# Patient Record
Sex: Male | Born: 1935 | Race: White | Hispanic: No | Marital: Married | Smoking: Never smoker
Health system: Southern US, Community
[De-identification: ages and names within clinical notes are randomized; demographics above are authoritative.]

## PROBLEM LIST (undated history)

## (undated) DIAGNOSIS — C679 Malignant neoplasm of bladder, unspecified: Secondary | ICD-10-CM

## (undated) DIAGNOSIS — I10 Essential (primary) hypertension: Secondary | ICD-10-CM

## (undated) DIAGNOSIS — E785 Hyperlipidemia, unspecified: Secondary | ICD-10-CM

## (undated) DIAGNOSIS — C61 Malignant neoplasm of prostate: Secondary | ICD-10-CM

## (undated) DIAGNOSIS — N400 Enlarged prostate without lower urinary tract symptoms: Secondary | ICD-10-CM

## (undated) DIAGNOSIS — I4891 Unspecified atrial fibrillation: Secondary | ICD-10-CM

## (undated) DIAGNOSIS — I429 Cardiomyopathy, unspecified: Secondary | ICD-10-CM

## (undated) DIAGNOSIS — K449 Diaphragmatic hernia without obstruction or gangrene: Secondary | ICD-10-CM

## (undated) DIAGNOSIS — F32A Depression, unspecified: Secondary | ICD-10-CM

## (undated) DIAGNOSIS — F329 Major depressive disorder, single episode, unspecified: Secondary | ICD-10-CM

## (undated) DIAGNOSIS — I219 Acute myocardial infarction, unspecified: Secondary | ICD-10-CM

## (undated) DIAGNOSIS — E871 Hypo-osmolality and hyponatremia: Secondary | ICD-10-CM

## (undated) DIAGNOSIS — Z8614 Personal history of Methicillin resistant Staphylococcus aureus infection: Secondary | ICD-10-CM

## (undated) DIAGNOSIS — I2510 Atherosclerotic heart disease of native coronary artery without angina pectoris: Secondary | ICD-10-CM

## (undated) HISTORY — PX: OTHER SURGICAL HISTORY: SHX169

## (undated) HISTORY — DX: Essential (primary) hypertension: I10

## (undated) HISTORY — DX: Malignant neoplasm of bladder, unspecified: C67.9

## (undated) HISTORY — DX: Major depressive disorder, single episode, unspecified: F32.9

## (undated) HISTORY — PX: CORONARY ARTERY BYPASS GRAFT: SHX141

## (undated) HISTORY — DX: Personal history of Methicillin resistant Staphylococcus aureus infection: Z86.14

## (undated) HISTORY — DX: Hyperlipidemia, unspecified: E78.5

## (undated) HISTORY — DX: Malignant neoplasm of prostate: C61

## (undated) HISTORY — PX: TONSILLECTOMY AND ADENOIDECTOMY: SUR1326

## (undated) HISTORY — DX: Depression, unspecified: F32.A

## (undated) HISTORY — PX: FINGER SURGERY: SHX640

---

## 2004-12-03 ENCOUNTER — Ambulatory Visit: Payer: Self-pay | Admitting: Cardiology

## 2005-01-18 ENCOUNTER — Encounter: Payer: Self-pay | Admitting: Cardiology

## 2005-01-30 ENCOUNTER — Encounter: Payer: Self-pay | Admitting: Cardiology

## 2005-03-02 ENCOUNTER — Encounter: Payer: Self-pay | Admitting: Cardiology

## 2005-04-01 ENCOUNTER — Encounter: Payer: Self-pay | Admitting: Cardiology

## 2005-05-02 ENCOUNTER — Encounter: Payer: Self-pay | Admitting: Cardiology

## 2005-06-02 ENCOUNTER — Encounter: Payer: Self-pay | Admitting: Cardiology

## 2005-06-20 ENCOUNTER — Ambulatory Visit: Payer: Self-pay | Admitting: Radiation Oncology

## 2005-06-30 ENCOUNTER — Ambulatory Visit: Payer: Self-pay | Admitting: Radiation Oncology

## 2005-07-31 ENCOUNTER — Ambulatory Visit: Payer: Self-pay | Admitting: Radiation Oncology

## 2005-08-30 ENCOUNTER — Ambulatory Visit: Payer: Self-pay | Admitting: Radiation Oncology

## 2005-09-30 ENCOUNTER — Ambulatory Visit: Payer: Self-pay | Admitting: Radiation Oncology

## 2006-02-06 ENCOUNTER — Ambulatory Visit: Payer: Self-pay | Admitting: Radiation Oncology

## 2006-03-02 ENCOUNTER — Ambulatory Visit: Payer: Self-pay | Admitting: Radiation Oncology

## 2006-08-14 ENCOUNTER — Ambulatory Visit: Payer: Self-pay | Admitting: Radiation Oncology

## 2006-08-31 ENCOUNTER — Ambulatory Visit: Payer: Self-pay | Admitting: Radiation Oncology

## 2006-09-22 DIAGNOSIS — K449 Diaphragmatic hernia without obstruction or gangrene: Secondary | ICD-10-CM | POA: Insufficient documentation

## 2007-02-14 ENCOUNTER — Ambulatory Visit: Payer: Self-pay | Admitting: Radiation Oncology

## 2007-03-03 ENCOUNTER — Ambulatory Visit: Payer: Self-pay | Admitting: Radiation Oncology

## 2007-05-11 ENCOUNTER — Ambulatory Visit: Payer: Self-pay | Admitting: Unknown Physician Specialty

## 2007-08-01 ENCOUNTER — Ambulatory Visit: Payer: Self-pay | Admitting: Radiation Oncology

## 2007-08-15 ENCOUNTER — Ambulatory Visit: Payer: Self-pay | Admitting: Radiation Oncology

## 2007-08-20 ENCOUNTER — Ambulatory Visit: Payer: Self-pay | Admitting: Family Medicine

## 2007-08-31 ENCOUNTER — Ambulatory Visit: Payer: Self-pay | Admitting: Radiation Oncology

## 2008-10-30 ENCOUNTER — Ambulatory Visit: Payer: Self-pay | Admitting: Radiation Oncology

## 2008-11-17 ENCOUNTER — Ambulatory Visit: Payer: Self-pay | Admitting: Radiation Oncology

## 2008-11-30 ENCOUNTER — Ambulatory Visit: Payer: Self-pay | Admitting: Radiation Oncology

## 2009-10-16 ENCOUNTER — Ambulatory Visit: Payer: Self-pay | Admitting: Family Medicine

## 2011-05-02 ENCOUNTER — Ambulatory Visit: Payer: Self-pay | Admitting: Family Medicine

## 2011-05-06 DIAGNOSIS — J189 Pneumonia, unspecified organism: Secondary | ICD-10-CM | POA: Diagnosis not present

## 2011-05-06 DIAGNOSIS — R5381 Other malaise: Secondary | ICD-10-CM | POA: Diagnosis not present

## 2011-05-06 DIAGNOSIS — J209 Acute bronchitis, unspecified: Secondary | ICD-10-CM | POA: Diagnosis not present

## 2011-05-06 DIAGNOSIS — R5383 Other fatigue: Secondary | ICD-10-CM | POA: Diagnosis not present

## 2011-05-07 DIAGNOSIS — J209 Acute bronchitis, unspecified: Secondary | ICD-10-CM | POA: Diagnosis not present

## 2011-05-07 DIAGNOSIS — J3489 Other specified disorders of nose and nasal sinuses: Secondary | ICD-10-CM | POA: Diagnosis not present

## 2011-05-07 DIAGNOSIS — J189 Pneumonia, unspecified organism: Secondary | ICD-10-CM | POA: Diagnosis not present

## 2011-05-11 DIAGNOSIS — J209 Acute bronchitis, unspecified: Secondary | ICD-10-CM | POA: Diagnosis not present

## 2011-05-11 DIAGNOSIS — J189 Pneumonia, unspecified organism: Secondary | ICD-10-CM | POA: Diagnosis not present

## 2011-05-11 DIAGNOSIS — J3489 Other specified disorders of nose and nasal sinuses: Secondary | ICD-10-CM | POA: Diagnosis not present

## 2011-05-17 DIAGNOSIS — J309 Allergic rhinitis, unspecified: Secondary | ICD-10-CM | POA: Diagnosis not present

## 2011-05-20 DIAGNOSIS — J209 Acute bronchitis, unspecified: Secondary | ICD-10-CM | POA: Diagnosis not present

## 2011-05-20 DIAGNOSIS — Z23 Encounter for immunization: Secondary | ICD-10-CM | POA: Diagnosis not present

## 2011-05-24 DIAGNOSIS — J309 Allergic rhinitis, unspecified: Secondary | ICD-10-CM | POA: Diagnosis not present

## 2011-05-31 DIAGNOSIS — J309 Allergic rhinitis, unspecified: Secondary | ICD-10-CM | POA: Diagnosis not present

## 2011-06-03 DIAGNOSIS — J309 Allergic rhinitis, unspecified: Secondary | ICD-10-CM | POA: Diagnosis not present

## 2011-06-07 DIAGNOSIS — J309 Allergic rhinitis, unspecified: Secondary | ICD-10-CM | POA: Diagnosis not present

## 2011-06-21 DIAGNOSIS — J309 Allergic rhinitis, unspecified: Secondary | ICD-10-CM | POA: Diagnosis not present

## 2011-06-28 DIAGNOSIS — J309 Allergic rhinitis, unspecified: Secondary | ICD-10-CM | POA: Diagnosis not present

## 2011-07-12 DIAGNOSIS — J309 Allergic rhinitis, unspecified: Secondary | ICD-10-CM | POA: Diagnosis not present

## 2011-07-19 DIAGNOSIS — J309 Allergic rhinitis, unspecified: Secondary | ICD-10-CM | POA: Diagnosis not present

## 2011-07-26 DIAGNOSIS — J309 Allergic rhinitis, unspecified: Secondary | ICD-10-CM | POA: Diagnosis not present

## 2011-08-02 DIAGNOSIS — J309 Allergic rhinitis, unspecified: Secondary | ICD-10-CM | POA: Diagnosis not present

## 2011-08-09 DIAGNOSIS — J309 Allergic rhinitis, unspecified: Secondary | ICD-10-CM | POA: Diagnosis not present

## 2011-08-23 DIAGNOSIS — J309 Allergic rhinitis, unspecified: Secondary | ICD-10-CM | POA: Diagnosis not present

## 2011-08-24 DIAGNOSIS — J209 Acute bronchitis, unspecified: Secondary | ICD-10-CM | POA: Diagnosis not present

## 2011-08-24 DIAGNOSIS — H6120 Impacted cerumen, unspecified ear: Secondary | ICD-10-CM | POA: Diagnosis not present

## 2011-08-24 DIAGNOSIS — J309 Allergic rhinitis, unspecified: Secondary | ICD-10-CM | POA: Diagnosis not present

## 2011-08-30 DIAGNOSIS — J309 Allergic rhinitis, unspecified: Secondary | ICD-10-CM | POA: Diagnosis not present

## 2011-09-05 DIAGNOSIS — I2510 Atherosclerotic heart disease of native coronary artery without angina pectoris: Secondary | ICD-10-CM | POA: Diagnosis not present

## 2011-09-05 DIAGNOSIS — I10 Essential (primary) hypertension: Secondary | ICD-10-CM | POA: Diagnosis not present

## 2011-09-05 DIAGNOSIS — E782 Mixed hyperlipidemia: Secondary | ICD-10-CM | POA: Diagnosis not present

## 2011-09-08 DIAGNOSIS — H2510 Age-related nuclear cataract, unspecified eye: Secondary | ICD-10-CM | POA: Diagnosis not present

## 2011-09-12 DIAGNOSIS — R0789 Other chest pain: Secondary | ICD-10-CM | POA: Diagnosis not present

## 2011-09-13 DIAGNOSIS — J309 Allergic rhinitis, unspecified: Secondary | ICD-10-CM | POA: Diagnosis not present

## 2011-09-20 DIAGNOSIS — J309 Allergic rhinitis, unspecified: Secondary | ICD-10-CM | POA: Diagnosis not present

## 2011-10-04 DIAGNOSIS — J309 Allergic rhinitis, unspecified: Secondary | ICD-10-CM | POA: Diagnosis not present

## 2011-10-11 DIAGNOSIS — J309 Allergic rhinitis, unspecified: Secondary | ICD-10-CM | POA: Diagnosis not present

## 2011-10-18 DIAGNOSIS — J309 Allergic rhinitis, unspecified: Secondary | ICD-10-CM | POA: Diagnosis not present

## 2011-10-25 DIAGNOSIS — J309 Allergic rhinitis, unspecified: Secondary | ICD-10-CM | POA: Diagnosis not present

## 2011-11-08 DIAGNOSIS — J309 Allergic rhinitis, unspecified: Secondary | ICD-10-CM | POA: Diagnosis not present

## 2011-11-22 DIAGNOSIS — J309 Allergic rhinitis, unspecified: Secondary | ICD-10-CM | POA: Diagnosis not present

## 2011-11-29 DIAGNOSIS — J309 Allergic rhinitis, unspecified: Secondary | ICD-10-CM | POA: Diagnosis not present

## 2011-12-06 DIAGNOSIS — J309 Allergic rhinitis, unspecified: Secondary | ICD-10-CM | POA: Diagnosis not present

## 2011-12-08 DIAGNOSIS — J309 Allergic rhinitis, unspecified: Secondary | ICD-10-CM | POA: Diagnosis not present

## 2011-12-13 DIAGNOSIS — E7800 Pure hypercholesterolemia, unspecified: Secondary | ICD-10-CM | POA: Diagnosis not present

## 2011-12-13 DIAGNOSIS — Z23 Encounter for immunization: Secondary | ICD-10-CM | POA: Diagnosis not present

## 2011-12-13 DIAGNOSIS — H6120 Impacted cerumen, unspecified ear: Secondary | ICD-10-CM | POA: Diagnosis not present

## 2011-12-13 DIAGNOSIS — Z0000 Encounter for general adult medical examination without abnormal findings: Secondary | ICD-10-CM | POA: Diagnosis not present

## 2011-12-13 DIAGNOSIS — Z8546 Personal history of malignant neoplasm of prostate: Secondary | ICD-10-CM | POA: Diagnosis not present

## 2011-12-13 DIAGNOSIS — J209 Acute bronchitis, unspecified: Secondary | ICD-10-CM | POA: Diagnosis not present

## 2011-12-20 DIAGNOSIS — J309 Allergic rhinitis, unspecified: Secondary | ICD-10-CM | POA: Diagnosis not present

## 2011-12-27 DIAGNOSIS — J309 Allergic rhinitis, unspecified: Secondary | ICD-10-CM | POA: Diagnosis not present

## 2012-01-03 DIAGNOSIS — J309 Allergic rhinitis, unspecified: Secondary | ICD-10-CM | POA: Diagnosis not present

## 2012-01-10 DIAGNOSIS — J309 Allergic rhinitis, unspecified: Secondary | ICD-10-CM | POA: Diagnosis not present

## 2012-01-17 DIAGNOSIS — J309 Allergic rhinitis, unspecified: Secondary | ICD-10-CM | POA: Diagnosis not present

## 2012-01-24 DIAGNOSIS — J309 Allergic rhinitis, unspecified: Secondary | ICD-10-CM | POA: Diagnosis not present

## 2012-01-31 DIAGNOSIS — J309 Allergic rhinitis, unspecified: Secondary | ICD-10-CM | POA: Diagnosis not present

## 2012-02-07 DIAGNOSIS — J309 Allergic rhinitis, unspecified: Secondary | ICD-10-CM | POA: Diagnosis not present

## 2012-02-14 DIAGNOSIS — J309 Allergic rhinitis, unspecified: Secondary | ICD-10-CM | POA: Diagnosis not present

## 2012-02-21 DIAGNOSIS — J309 Allergic rhinitis, unspecified: Secondary | ICD-10-CM | POA: Diagnosis not present

## 2012-02-28 DIAGNOSIS — H2510 Age-related nuclear cataract, unspecified eye: Secondary | ICD-10-CM | POA: Diagnosis not present

## 2012-02-28 DIAGNOSIS — J309 Allergic rhinitis, unspecified: Secondary | ICD-10-CM | POA: Diagnosis not present

## 2012-02-28 DIAGNOSIS — J301 Allergic rhinitis due to pollen: Secondary | ICD-10-CM | POA: Diagnosis not present

## 2012-02-28 DIAGNOSIS — H1045 Other chronic allergic conjunctivitis: Secondary | ICD-10-CM | POA: Diagnosis not present

## 2012-02-28 DIAGNOSIS — J3089 Other allergic rhinitis: Secondary | ICD-10-CM | POA: Diagnosis not present

## 2012-03-06 DIAGNOSIS — J309 Allergic rhinitis, unspecified: Secondary | ICD-10-CM | POA: Diagnosis not present

## 2012-03-13 DIAGNOSIS — J309 Allergic rhinitis, unspecified: Secondary | ICD-10-CM | POA: Diagnosis not present

## 2012-03-27 DIAGNOSIS — J309 Allergic rhinitis, unspecified: Secondary | ICD-10-CM | POA: Diagnosis not present

## 2012-03-28 DIAGNOSIS — E782 Mixed hyperlipidemia: Secondary | ICD-10-CM | POA: Diagnosis not present

## 2012-03-28 DIAGNOSIS — I10 Essential (primary) hypertension: Secondary | ICD-10-CM | POA: Diagnosis not present

## 2012-03-28 DIAGNOSIS — I200 Unstable angina: Secondary | ICD-10-CM | POA: Diagnosis not present

## 2012-04-03 DIAGNOSIS — J309 Allergic rhinitis, unspecified: Secondary | ICD-10-CM | POA: Diagnosis not present

## 2012-04-10 DIAGNOSIS — J309 Allergic rhinitis, unspecified: Secondary | ICD-10-CM | POA: Diagnosis not present

## 2012-04-17 DIAGNOSIS — J309 Allergic rhinitis, unspecified: Secondary | ICD-10-CM | POA: Diagnosis not present

## 2012-05-02 HISTORY — PX: COLONOSCOPY: SHX174

## 2012-05-08 DIAGNOSIS — J309 Allergic rhinitis, unspecified: Secondary | ICD-10-CM | POA: Diagnosis not present

## 2012-05-17 DIAGNOSIS — J309 Allergic rhinitis, unspecified: Secondary | ICD-10-CM | POA: Diagnosis not present

## 2012-05-22 DIAGNOSIS — J309 Allergic rhinitis, unspecified: Secondary | ICD-10-CM | POA: Diagnosis not present

## 2012-05-31 DIAGNOSIS — J309 Allergic rhinitis, unspecified: Secondary | ICD-10-CM | POA: Diagnosis not present

## 2012-06-05 DIAGNOSIS — J309 Allergic rhinitis, unspecified: Secondary | ICD-10-CM | POA: Diagnosis not present

## 2012-06-12 DIAGNOSIS — J309 Allergic rhinitis, unspecified: Secondary | ICD-10-CM | POA: Diagnosis not present

## 2012-06-26 DIAGNOSIS — J309 Allergic rhinitis, unspecified: Secondary | ICD-10-CM | POA: Diagnosis not present

## 2012-07-03 DIAGNOSIS — J309 Allergic rhinitis, unspecified: Secondary | ICD-10-CM | POA: Diagnosis not present

## 2012-07-10 DIAGNOSIS — J309 Allergic rhinitis, unspecified: Secondary | ICD-10-CM | POA: Diagnosis not present

## 2012-07-17 DIAGNOSIS — J309 Allergic rhinitis, unspecified: Secondary | ICD-10-CM | POA: Diagnosis not present

## 2012-07-22 DIAGNOSIS — L0233 Carbuncle of buttock: Secondary | ICD-10-CM | POA: Diagnosis not present

## 2012-07-24 DIAGNOSIS — J309 Allergic rhinitis, unspecified: Secondary | ICD-10-CM | POA: Diagnosis not present

## 2012-07-31 DIAGNOSIS — R5381 Other malaise: Secondary | ICD-10-CM | POA: Diagnosis not present

## 2012-07-31 DIAGNOSIS — Z23 Encounter for immunization: Secondary | ICD-10-CM | POA: Diagnosis not present

## 2012-07-31 DIAGNOSIS — L02429 Furuncle of limb, unspecified: Secondary | ICD-10-CM | POA: Diagnosis not present

## 2012-07-31 DIAGNOSIS — J209 Acute bronchitis, unspecified: Secondary | ICD-10-CM | POA: Diagnosis not present

## 2012-07-31 DIAGNOSIS — R5383 Other fatigue: Secondary | ICD-10-CM | POA: Diagnosis not present

## 2012-08-07 DIAGNOSIS — A4902 Methicillin resistant Staphylococcus aureus infection, unspecified site: Secondary | ICD-10-CM | POA: Diagnosis not present

## 2012-08-07 DIAGNOSIS — J209 Acute bronchitis, unspecified: Secondary | ICD-10-CM | POA: Diagnosis not present

## 2012-08-07 DIAGNOSIS — L02429 Furuncle of limb, unspecified: Secondary | ICD-10-CM | POA: Diagnosis not present

## 2012-08-07 DIAGNOSIS — Z23 Encounter for immunization: Secondary | ICD-10-CM | POA: Diagnosis not present

## 2012-08-14 DIAGNOSIS — J309 Allergic rhinitis, unspecified: Secondary | ICD-10-CM | POA: Diagnosis not present

## 2012-08-15 ENCOUNTER — Encounter: Payer: Self-pay | Admitting: Cardiothoracic Surgery

## 2012-08-15 ENCOUNTER — Encounter: Payer: Self-pay | Admitting: Nurse Practitioner

## 2012-08-15 DIAGNOSIS — L02429 Furuncle of limb, unspecified: Secondary | ICD-10-CM | POA: Diagnosis not present

## 2012-08-15 DIAGNOSIS — I2510 Atherosclerotic heart disease of native coronary artery without angina pectoris: Secondary | ICD-10-CM | POA: Diagnosis not present

## 2012-08-15 DIAGNOSIS — Z8614 Personal history of Methicillin resistant Staphylococcus aureus infection: Secondary | ICD-10-CM | POA: Diagnosis not present

## 2012-08-15 DIAGNOSIS — J45909 Unspecified asthma, uncomplicated: Secondary | ICD-10-CM | POA: Diagnosis not present

## 2012-08-22 DIAGNOSIS — L02429 Furuncle of limb, unspecified: Secondary | ICD-10-CM | POA: Diagnosis not present

## 2012-08-22 DIAGNOSIS — J45909 Unspecified asthma, uncomplicated: Secondary | ICD-10-CM | POA: Diagnosis not present

## 2012-08-22 DIAGNOSIS — Z8614 Personal history of Methicillin resistant Staphylococcus aureus infection: Secondary | ICD-10-CM | POA: Diagnosis not present

## 2012-08-22 DIAGNOSIS — I2510 Atherosclerotic heart disease of native coronary artery without angina pectoris: Secondary | ICD-10-CM | POA: Diagnosis not present

## 2012-08-23 DIAGNOSIS — J309 Allergic rhinitis, unspecified: Secondary | ICD-10-CM | POA: Diagnosis not present

## 2012-08-30 ENCOUNTER — Encounter: Payer: Self-pay | Admitting: Cardiothoracic Surgery

## 2012-08-30 ENCOUNTER — Encounter: Payer: Self-pay | Admitting: Nurse Practitioner

## 2012-08-30 DIAGNOSIS — J45909 Unspecified asthma, uncomplicated: Secondary | ICD-10-CM | POA: Diagnosis not present

## 2012-08-30 DIAGNOSIS — Z8614 Personal history of Methicillin resistant Staphylococcus aureus infection: Secondary | ICD-10-CM | POA: Diagnosis not present

## 2012-08-30 DIAGNOSIS — L02429 Furuncle of limb, unspecified: Secondary | ICD-10-CM | POA: Diagnosis not present

## 2012-08-30 DIAGNOSIS — I2510 Atherosclerotic heart disease of native coronary artery without angina pectoris: Secondary | ICD-10-CM | POA: Diagnosis not present

## 2012-08-31 DIAGNOSIS — J45909 Unspecified asthma, uncomplicated: Secondary | ICD-10-CM | POA: Diagnosis not present

## 2012-08-31 DIAGNOSIS — Z8614 Personal history of Methicillin resistant Staphylococcus aureus infection: Secondary | ICD-10-CM | POA: Diagnosis not present

## 2012-08-31 DIAGNOSIS — L02429 Furuncle of limb, unspecified: Secondary | ICD-10-CM | POA: Diagnosis not present

## 2012-08-31 DIAGNOSIS — I2510 Atherosclerotic heart disease of native coronary artery without angina pectoris: Secondary | ICD-10-CM | POA: Diagnosis not present

## 2012-09-04 DIAGNOSIS — J309 Allergic rhinitis, unspecified: Secondary | ICD-10-CM | POA: Diagnosis not present

## 2012-09-10 DIAGNOSIS — L02429 Furuncle of limb, unspecified: Secondary | ICD-10-CM | POA: Diagnosis not present

## 2012-09-10 DIAGNOSIS — I2510 Atherosclerotic heart disease of native coronary artery without angina pectoris: Secondary | ICD-10-CM | POA: Diagnosis not present

## 2012-09-10 DIAGNOSIS — J45909 Unspecified asthma, uncomplicated: Secondary | ICD-10-CM | POA: Diagnosis not present

## 2012-09-10 DIAGNOSIS — Z8614 Personal history of Methicillin resistant Staphylococcus aureus infection: Secondary | ICD-10-CM | POA: Diagnosis not present

## 2012-09-11 DIAGNOSIS — J309 Allergic rhinitis, unspecified: Secondary | ICD-10-CM | POA: Diagnosis not present

## 2012-09-18 DIAGNOSIS — J309 Allergic rhinitis, unspecified: Secondary | ICD-10-CM | POA: Diagnosis not present

## 2012-09-25 DIAGNOSIS — J309 Allergic rhinitis, unspecified: Secondary | ICD-10-CM | POA: Diagnosis not present

## 2012-10-02 DIAGNOSIS — J309 Allergic rhinitis, unspecified: Secondary | ICD-10-CM | POA: Diagnosis not present

## 2012-10-09 DIAGNOSIS — J309 Allergic rhinitis, unspecified: Secondary | ICD-10-CM | POA: Diagnosis not present

## 2012-10-16 DIAGNOSIS — J309 Allergic rhinitis, unspecified: Secondary | ICD-10-CM | POA: Diagnosis not present

## 2012-11-06 DIAGNOSIS — I25810 Atherosclerosis of coronary artery bypass graft(s) without angina pectoris: Secondary | ICD-10-CM | POA: Diagnosis not present

## 2012-11-06 DIAGNOSIS — I10 Essential (primary) hypertension: Secondary | ICD-10-CM | POA: Diagnosis not present

## 2012-11-06 DIAGNOSIS — E782 Mixed hyperlipidemia: Secondary | ICD-10-CM | POA: Diagnosis not present

## 2012-11-13 DIAGNOSIS — J309 Allergic rhinitis, unspecified: Secondary | ICD-10-CM | POA: Diagnosis not present

## 2012-11-20 DIAGNOSIS — J309 Allergic rhinitis, unspecified: Secondary | ICD-10-CM | POA: Diagnosis not present

## 2012-11-26 DIAGNOSIS — H25019 Cortical age-related cataract, unspecified eye: Secondary | ICD-10-CM | POA: Diagnosis not present

## 2012-11-26 DIAGNOSIS — H18419 Arcus senilis, unspecified eye: Secondary | ICD-10-CM | POA: Diagnosis not present

## 2012-11-26 DIAGNOSIS — H2510 Age-related nuclear cataract, unspecified eye: Secondary | ICD-10-CM | POA: Diagnosis not present

## 2012-11-26 DIAGNOSIS — H02839 Dermatochalasis of unspecified eye, unspecified eyelid: Secondary | ICD-10-CM | POA: Diagnosis not present

## 2012-12-11 DIAGNOSIS — J309 Allergic rhinitis, unspecified: Secondary | ICD-10-CM | POA: Diagnosis not present

## 2012-12-18 DIAGNOSIS — J309 Allergic rhinitis, unspecified: Secondary | ICD-10-CM | POA: Diagnosis not present

## 2012-12-24 DIAGNOSIS — Z0000 Encounter for general adult medical examination without abnormal findings: Secondary | ICD-10-CM | POA: Diagnosis not present

## 2012-12-24 DIAGNOSIS — J209 Acute bronchitis, unspecified: Secondary | ICD-10-CM | POA: Diagnosis not present

## 2012-12-24 DIAGNOSIS — A4902 Methicillin resistant Staphylococcus aureus infection, unspecified site: Secondary | ICD-10-CM | POA: Diagnosis not present

## 2012-12-24 DIAGNOSIS — Z23 Encounter for immunization: Secondary | ICD-10-CM | POA: Diagnosis not present

## 2012-12-25 DIAGNOSIS — J309 Allergic rhinitis, unspecified: Secondary | ICD-10-CM | POA: Diagnosis not present

## 2012-12-26 DIAGNOSIS — H2510 Age-related nuclear cataract, unspecified eye: Secondary | ICD-10-CM | POA: Diagnosis not present

## 2012-12-26 DIAGNOSIS — Z8546 Personal history of malignant neoplasm of prostate: Secondary | ICD-10-CM | POA: Diagnosis not present

## 2012-12-26 DIAGNOSIS — E7800 Pure hypercholesterolemia, unspecified: Secondary | ICD-10-CM | POA: Diagnosis not present

## 2012-12-26 DIAGNOSIS — F329 Major depressive disorder, single episode, unspecified: Secondary | ICD-10-CM | POA: Diagnosis not present

## 2012-12-26 DIAGNOSIS — H269 Unspecified cataract: Secondary | ICD-10-CM | POA: Diagnosis not present

## 2013-01-01 DIAGNOSIS — H2510 Age-related nuclear cataract, unspecified eye: Secondary | ICD-10-CM | POA: Diagnosis not present

## 2013-01-01 DIAGNOSIS — H269 Unspecified cataract: Secondary | ICD-10-CM | POA: Diagnosis not present

## 2013-01-08 DIAGNOSIS — J309 Allergic rhinitis, unspecified: Secondary | ICD-10-CM | POA: Diagnosis not present

## 2013-01-22 DIAGNOSIS — J309 Allergic rhinitis, unspecified: Secondary | ICD-10-CM | POA: Diagnosis not present

## 2013-02-05 DIAGNOSIS — J309 Allergic rhinitis, unspecified: Secondary | ICD-10-CM | POA: Diagnosis not present

## 2013-02-26 DIAGNOSIS — H1045 Other chronic allergic conjunctivitis: Secondary | ICD-10-CM | POA: Diagnosis not present

## 2013-02-26 DIAGNOSIS — J3089 Other allergic rhinitis: Secondary | ICD-10-CM | POA: Diagnosis not present

## 2013-02-26 DIAGNOSIS — J301 Allergic rhinitis due to pollen: Secondary | ICD-10-CM | POA: Diagnosis not present

## 2013-02-26 DIAGNOSIS — J309 Allergic rhinitis, unspecified: Secondary | ICD-10-CM | POA: Diagnosis not present

## 2013-04-17 DIAGNOSIS — H18419 Arcus senilis, unspecified eye: Secondary | ICD-10-CM | POA: Diagnosis not present

## 2013-04-17 DIAGNOSIS — H26499 Other secondary cataract, unspecified eye: Secondary | ICD-10-CM | POA: Diagnosis not present

## 2013-04-17 DIAGNOSIS — Z961 Presence of intraocular lens: Secondary | ICD-10-CM | POA: Diagnosis not present

## 2013-05-17 DIAGNOSIS — I10 Essential (primary) hypertension: Secondary | ICD-10-CM | POA: Diagnosis not present

## 2013-05-17 DIAGNOSIS — E782 Mixed hyperlipidemia: Secondary | ICD-10-CM | POA: Diagnosis not present

## 2013-05-17 DIAGNOSIS — I25810 Atherosclerosis of coronary artery bypass graft(s) without angina pectoris: Secondary | ICD-10-CM | POA: Diagnosis not present

## 2013-05-24 DIAGNOSIS — R079 Chest pain, unspecified: Secondary | ICD-10-CM | POA: Diagnosis not present

## 2013-07-09 DIAGNOSIS — L821 Other seborrheic keratosis: Secondary | ICD-10-CM | POA: Diagnosis not present

## 2013-07-09 DIAGNOSIS — D235 Other benign neoplasm of skin of trunk: Secondary | ICD-10-CM | POA: Diagnosis not present

## 2013-07-09 DIAGNOSIS — L570 Actinic keratosis: Secondary | ICD-10-CM | POA: Diagnosis not present

## 2013-07-09 DIAGNOSIS — L819 Disorder of pigmentation, unspecified: Secondary | ICD-10-CM | POA: Diagnosis not present

## 2013-07-09 DIAGNOSIS — L820 Inflamed seborrheic keratosis: Secondary | ICD-10-CM | POA: Diagnosis not present

## 2013-07-09 DIAGNOSIS — L989 Disorder of the skin and subcutaneous tissue, unspecified: Secondary | ICD-10-CM | POA: Diagnosis not present

## 2013-11-25 DIAGNOSIS — F341 Dysthymic disorder: Secondary | ICD-10-CM | POA: Diagnosis not present

## 2013-11-25 DIAGNOSIS — H918X9 Other specified hearing loss, unspecified ear: Secondary | ICD-10-CM | POA: Diagnosis not present

## 2013-11-25 DIAGNOSIS — Z789 Other specified health status: Secondary | ICD-10-CM | POA: Diagnosis not present

## 2013-11-25 DIAGNOSIS — Z23 Encounter for immunization: Secondary | ICD-10-CM | POA: Diagnosis not present

## 2013-12-30 DIAGNOSIS — E785 Hyperlipidemia, unspecified: Secondary | ICD-10-CM | POA: Diagnosis not present

## 2013-12-30 DIAGNOSIS — K449 Diaphragmatic hernia without obstruction or gangrene: Secondary | ICD-10-CM | POA: Diagnosis not present

## 2013-12-30 DIAGNOSIS — I10 Essential (primary) hypertension: Secondary | ICD-10-CM | POA: Diagnosis not present

## 2013-12-30 DIAGNOSIS — I25810 Atherosclerosis of coronary artery bypass graft(s) without angina pectoris: Secondary | ICD-10-CM | POA: Diagnosis not present

## 2014-01-17 DIAGNOSIS — Z0000 Encounter for general adult medical examination without abnormal findings: Secondary | ICD-10-CM | POA: Diagnosis not present

## 2014-01-17 DIAGNOSIS — Z789 Other specified health status: Secondary | ICD-10-CM | POA: Diagnosis not present

## 2014-01-17 DIAGNOSIS — F341 Dysthymic disorder: Secondary | ICD-10-CM | POA: Diagnosis not present

## 2014-01-17 DIAGNOSIS — H918X9 Other specified hearing loss, unspecified ear: Secondary | ICD-10-CM | POA: Diagnosis not present

## 2014-01-17 DIAGNOSIS — Z23 Encounter for immunization: Secondary | ICD-10-CM | POA: Diagnosis not present

## 2014-01-22 DIAGNOSIS — Z8546 Personal history of malignant neoplasm of prostate: Secondary | ICD-10-CM | POA: Diagnosis not present

## 2014-01-22 DIAGNOSIS — E7800 Pure hypercholesterolemia, unspecified: Secondary | ICD-10-CM | POA: Diagnosis not present

## 2014-05-05 DIAGNOSIS — H26491 Other secondary cataract, right eye: Secondary | ICD-10-CM | POA: Diagnosis not present

## 2014-07-21 DIAGNOSIS — I10 Essential (primary) hypertension: Secondary | ICD-10-CM | POA: Diagnosis not present

## 2014-07-21 DIAGNOSIS — E782 Mixed hyperlipidemia: Secondary | ICD-10-CM | POA: Diagnosis not present

## 2014-07-21 DIAGNOSIS — I25810 Atherosclerosis of coronary artery bypass graft(s) without angina pectoris: Secondary | ICD-10-CM | POA: Diagnosis not present

## 2014-10-24 ENCOUNTER — Encounter: Payer: Self-pay | Admitting: Emergency Medicine

## 2014-10-24 ENCOUNTER — Other Ambulatory Visit: Payer: Self-pay

## 2014-10-24 ENCOUNTER — Emergency Department: Payer: Medicare Other

## 2014-10-24 ENCOUNTER — Emergency Department
Admission: EM | Admit: 2014-10-24 | Discharge: 2014-10-25 | Disposition: A | Discharge status: Home / Self Care | Payer: Medicare Other | Attending: Emergency Medicine | Admitting: Emergency Medicine

## 2014-10-24 DIAGNOSIS — I252 Old myocardial infarction: Secondary | ICD-10-CM | POA: Insufficient documentation

## 2014-10-24 DIAGNOSIS — R079 Chest pain, unspecified: Secondary | ICD-10-CM | POA: Diagnosis not present

## 2014-10-24 DIAGNOSIS — R0789 Other chest pain: Secondary | ICD-10-CM | POA: Insufficient documentation

## 2014-10-24 NOTE — ED Notes (Signed)
Pt to ED with daughter c/o chest pain while he was feeling hot, CP has since resolved.

## 2014-10-25 DIAGNOSIS — R0789 Other chest pain: Secondary | ICD-10-CM | POA: Diagnosis not present

## 2014-10-25 LAB — BASIC METABOLIC PANEL
Anion gap: 7 (ref 5–15)
BUN: 16 mg/dL (ref 6–20)
CO2: 24 mmol/L (ref 22–32)
Calcium: 8.7 mg/dL — ABNORMAL LOW (ref 8.9–10.3)
Chloride: 108 mmol/L (ref 101–111)
Creatinine, Ser: 1.11 mg/dL (ref 0.61–1.24)
GFR calc Af Amer: >60 mL/min (ref >60)
GFR calc non Af Amer: 60 mL/min — ABNORMAL LOW (ref >60)
Glucose, Bld: 163 mg/dL — ABNORMAL HIGH (ref 65–99)
Potassium: 3.8 mmol/L (ref 3.5–5.1)
Sodium: 139 mmol/L (ref 135–145)

## 2014-10-25 LAB — CBC
HCT: 40.9 % (ref 40.0–52.0)
Hemoglobin: 13.8 g/dL (ref 13.0–18.0)
MCH: 32.8 pg (ref 26.0–34.0)
MCHC: 33.7 g/dL (ref 32.0–36.0)
MCV: 97.2 fL (ref 80.0–100.0)
Platelets: 154 10*3/uL (ref 150–440)
RBC: 4.21 MIL/uL — ABNORMAL LOW (ref 4.40–5.90)
RDW: 13.5 % (ref 11.5–14.5)
WBC: 8 10*3/uL (ref 3.8–10.6)

## 2014-10-25 LAB — TROPONIN I
Troponin I: <0.03 ng/mL (ref <0.031)
Troponin I: <0.03 ng/mL (ref <0.031)

## 2014-10-25 MED ORDER — ASPIRIN 81 MG PO CHEW
243.0000 mg | CHEWABLE_TABLET | Freq: Once | ORAL | Status: AC
  Administered 2014-10-25: 243 mg via ORAL

## 2014-10-25 MED ORDER — ASPIRIN 81 MG PO CHEW
CHEWABLE_TABLET | ORAL | Status: AC
  Administered 2014-10-25: 243 mg via ORAL
  Filled 2014-10-25: qty 3

## 2014-10-25 NOTE — Discharge Instructions (Signed)

## 2014-10-25 NOTE — ED Provider Notes (Signed)
[facility] Emergency Department Provider Note  ____________________________________________  Time seen: Approximately 2:13 AM  I have reviewed the triage vital signs and the nursing notes.   HISTORY  Chief Complaint Chest Pain    HPI DRAVON NOTT is a 81 y.o. male with a history of CAD status post CABG about 10 years ago but is otherwise quite healthy and active who presents with a brief episode of sharp left-sided chest pain which occurred several hours ago.  It was acute in onset and lasted 2-3 minutes before resolving completely.  He states that he has been working very hard this week from early in the morning until about 10:00 at night and has not been eating very much due to how busy he is.  He became very hot this evening while he was outside working and was on his feet for extended period of time.  The chest pain was sudden but was not accompanied with any diaphoresis, shortness of breath, nausea, or vomiting.  It does not feel anything like his prior MI that happened about 10 years ago.  He is currently completely asymptomatic.   Past Medical History  Diagnosis Date  . MI (myocardial infarction)     There are no active problems to display for this patient.   Past Surgical History  Procedure Laterality Date  . Coronary artery bypass graft      Quad    No current outpatient prescriptions on file.  Allergies Review of patient's allergies indicates no known allergies.  History reviewed. No pertinent family history.  Social History History  Substance Use Topics  . Smoking status: Never Smoker   . Smokeless tobacco: Never Used  . Alcohol Use: Yes    Review of Systems Constitutional: No fever/chills Eyes: No visual changes. ENT: No sore throat. Cardiovascular: Sharp left-sided chest pain which is completely resolved. Respiratory: Denies shortness of breath. Gastrointestinal: No abdominal pain.  No nausea, no vomiting.  No diarrhea.  No  constipation. Genitourinary: Negative for dysuria. Musculoskeletal: Negative for back pain. Skin: Negative for rash. Neurological: Negative for headaches, focal weakness or numbness.  10-point ROS otherwise negative.  ____________________________________________   PHYSICAL EXAM:  VITAL SIGNS: ED Triage Vitals  Enc Vitals Group     BP 10/24/14 2327 157/70 mmHg     Pulse Rate 10/24/14 2327 77     Resp 10/24/14 2327 18     Temp 10/24/14 2327 97.7 F (36.5 C)     Temp Source 10/24/14 2327 Oral     SpO2 10/24/14 2327 99 %     Weight 10/24/14 2327 203 lb (92.08 kg)     Height 10/24/14 2327 6' (1.829 m)     Head Cir --      Peak Flow --      Pain Score 10/24/14 2329 0     Pain Loc --      Pain Edu? --      Excl. in [place]? --     Constitutional: Alert and oriented. Well appearing and in no acute distress.  Appears younger than stated age Eyes: Conjunctivae are normal. PERRL. EOMI. Head: Atraumatic. Nose: No congestion/rhinnorhea. Mouth/Throat: Mucous membranes are moist.  Oropharynx non-erythematous. Neck: No stridor.   Cardiovascular: Normal rate, regular rhythm. Grossly normal heart sounds.  Good peripheral circulation.  Old well-healed surgical scar in the middle of his chest Respiratory: Normal respiratory effort.  No retractions. Lungs CTAB. Gastrointestinal: Soft and nontender. No distention. No abdominal bruits. No CVA tenderness. Musculoskeletal: No  lower extremity tenderness nor edema.  No joint effusions. Neurologic:  Normal speech and language. No gross focal neurologic deficits are appreciated. Speech is normal. Skin:  Skin is warm, dry and intact. No rash noted.   ____________________________________________   LABS (all labs ordered are listed, but only abnormal results are displayed)  Labs Reviewed  CBC - Abnormal; Notable for the following:    RBC 4.21 (*)    All other components within normal limits  BASIC METABOLIC PANEL - Abnormal; Notable for the  following:    Glucose, Bld 163 (*)    Calcium 8.7 (*)    GFR calc non Af Amer 60 (*)    All other components within normal limits  TROPONIN I  TROPONIN I   ____________________________________________  EKG  ED ECG REPORT I, Allyce Bochicchio, the attending physician, personally viewed and interpreted this ECG.   Date: 10/24/2014  EKG Time: 23:50  Rate: 58  Rhythm: sinus bradycardia, occasional PVC noted, unifocal  Axis: Normal  Intervals:right bundle branch block  ST&T Change: Non-specific ST segment / T-wave changes, but no evidence of acute ischemia. No old EKG is available to compare ____________________________________________  RADIOLOGY  I, [facility], Cedar Ditullio, personally viewed and evaluated these images as part of my medical decision making.   Dg Chest Port 1 View  10/25/2014   CLINICAL DATA:  Left chest pain  EXAM: PORTABLE CHEST - 1 VIEW  COMPARISON:  05/02/2011  FINDINGS: Left basilar scarring/ atelectasis. Mild eventration of the right hemidiaphragm. No pleural effusion or pneumothorax.  Cardiomegaly.  Postsurgical changes related to prior CABG.  IMPRESSION: No evidence of acute cardiopulmonary disease.   Electronically Signed   By: Julian Hy M.D.   On: 10/25/2014 00:14    ____________________________________________   PROCEDURES  Procedure(s) performed: None  Critical Care performed: No ____________________________________________   INITIAL IMPRESSION / ASSESSMENT AND PLAN / ED COURSE  Pertinent labs & imaging results that were available during my care of the patient were reviewed by me and considered in my medical decision making (see chart for details).  The patient is very well-appearing and active and is currently asymptomatic and has been so since being in the emergency department.  His brief episode of chest pain may have been ischemic given his history of CAD, but I do not believe it represents ACS.  He sees Dr. Ubaldo Glassing and I advised close follow-up.  I  believe it is appropriate to check a second troponin for this patient, but assuming a second troponin continues to be negative and the patient remains asymptomatic, I will discharge for close outpatient follow-up.  He received 3 aspirin 81 mg in the emergency department to make a total of a full dose aspirin for today.  ____________________________________________  FINAL CLINICAL IMPRESSION(S) / ED DIAGNOSES  Final diagnoses:  Chest pain, unspecified chest pain type      NEW MEDICATIONS STARTED DURING THIS VISIT:  New Prescriptions   No medications on file     Hinda Kehr, MD 10/25/14 [phone]

## 2014-12-16 ENCOUNTER — Encounter: Payer: Self-pay | Admitting: Family Medicine

## 2014-12-16 ENCOUNTER — Ambulatory Visit
Admission: RE | Admit: 2014-12-16 | Discharge: 2014-12-16 | Disposition: A | Discharge status: Home / Self Care | Payer: Medicare Other | Source: Ambulatory Visit | Attending: Family Medicine | Admitting: Family Medicine

## 2014-12-16 ENCOUNTER — Ambulatory Visit (INDEPENDENT_AMBULATORY_CARE_PROVIDER_SITE_OTHER): Payer: Medicare Other | Admitting: Family Medicine

## 2014-12-16 VITALS — BP 116/50 | HR 66 | Temp 98.4°F | Resp 16 | Wt 203.2 lb

## 2014-12-16 DIAGNOSIS — A4902 Methicillin resistant Staphylococcus aureus infection, unspecified site: Secondary | ICD-10-CM | POA: Insufficient documentation

## 2014-12-16 DIAGNOSIS — E785 Hyperlipidemia, unspecified: Secondary | ICD-10-CM | POA: Insufficient documentation

## 2014-12-16 DIAGNOSIS — Z8546 Personal history of malignant neoplasm of prostate: Secondary | ICD-10-CM | POA: Insufficient documentation

## 2014-12-16 DIAGNOSIS — F329 Major depressive disorder, single episode, unspecified: Secondary | ICD-10-CM | POA: Insufficient documentation

## 2014-12-16 DIAGNOSIS — I2510 Atherosclerotic heart disease of native coronary artery without angina pectoris: Secondary | ICD-10-CM | POA: Insufficient documentation

## 2014-12-16 DIAGNOSIS — R05 Cough: Secondary | ICD-10-CM | POA: Insufficient documentation

## 2014-12-16 DIAGNOSIS — I10 Essential (primary) hypertension: Secondary | ICD-10-CM | POA: Insufficient documentation

## 2014-12-16 DIAGNOSIS — Z8659 Personal history of other mental and behavioral disorders: Secondary | ICD-10-CM | POA: Insufficient documentation

## 2014-12-16 DIAGNOSIS — F32A Depression, unspecified: Secondary | ICD-10-CM | POA: Insufficient documentation

## 2014-12-16 DIAGNOSIS — Z951 Presence of aortocoronary bypass graft: Secondary | ICD-10-CM | POA: Insufficient documentation

## 2014-12-16 DIAGNOSIS — R059 Cough, unspecified: Secondary | ICD-10-CM

## 2014-12-16 DIAGNOSIS — N400 Enlarged prostate without lower urinary tract symptoms: Secondary | ICD-10-CM | POA: Insufficient documentation

## 2014-12-16 DIAGNOSIS — S42009A Fracture of unspecified part of unspecified clavicle, initial encounter for closed fracture: Secondary | ICD-10-CM | POA: Insufficient documentation

## 2014-12-16 MED ORDER — RANITIDINE HCL 150 MG PO TABS: 150.0000 mg | ORAL_TABLET | Freq: Two times a day (BID) | ORAL | Status: DC

## 2014-12-16 NOTE — Patient Instructions (Signed)
Resume head of bed elevation on 6 inch blocks. We will call you with the chest x-ray result.

## 2014-12-16 NOTE — Progress Notes (Signed)
Subjective:     Patient ID: Charles Cook, male   DOB: 10-Nov-1929, 81 y.o.   MRN: [id number]  HPI  Chief Complaint  Patient presents with  . Cough    Patient comes in office today to address ongoing cough for the past several years. Patient reports that he has been seen and treated for cough before and was prescribed antibiotics which he states helped clear cough but has since returned. Patient states that cough is productive of phlegm  which he describes as bright yellow, patient states cough is more prominent after eating meal and at night when he is at rest.   Reports increased phlegm after meals and at night which provokes cough. Reports that his allergies are under control and denies current heartburn. However, he has a hx of GERD with HOB elevation which he d/ced when sx improved.   Review of Systems  Respiratory:       Last CXR during a negative ER evaluation for chest pain in June. No evidence of heart failure at that time.       Objective:   Physical Exam  Constitutional: He appears well-developed and well-nourished. No distress.  Cardiovascular: Normal rate and regular rhythm.   Lungs: bibasilar coarse crackles. Extremities: no edema    Assessment:    1. Cough - DG Chest 2 View; Future - ranitidine (ZANTAC) 150 MG tablet; Take 1 tablet (150 mg total) by mouth 2 (two) times daily.  Dispense: 60 tablet; Refill: 0    Plan:    Resume HOB elevation. Further f/u pending x-ray report.

## 2014-12-17 ENCOUNTER — Telehealth: Payer: Self-pay

## 2014-12-17 NOTE — Telephone Encounter (Signed)
-----   Message from Carmon Ginsberg, [place] sent at 12/17/2014  7:51 AM EDT ----- X-ray ok. No sign of pneumonia. Try head of bed elevation on blocks along with Zantac for one to two weeks and see if cough improves. If not, we will refer to pulmonary doctor.

## 2014-12-17 NOTE — Telephone Encounter (Signed)
Patient has been advised. KW 

## 2015-01-19 ENCOUNTER — Other Ambulatory Visit: Payer: Self-pay | Admitting: Family Medicine

## 2015-01-19 NOTE — Telephone Encounter (Signed)
Charles Cook sent RX to pharmacy.

## 2015-01-19 NOTE — Telephone Encounter (Signed)
Pt needs refill anitidine (ZANTAC) 150 MG tablet   He uses [facility] [address]  Call back is [phone]  Thanks Con Memos

## 2015-02-06 ENCOUNTER — Encounter: Payer: Self-pay | Admitting: Family Medicine

## 2015-02-06 ENCOUNTER — Ambulatory Visit (INDEPENDENT_AMBULATORY_CARE_PROVIDER_SITE_OTHER): Payer: Medicare Other | Admitting: Family Medicine

## 2015-02-06 VITALS — BP 116/54 | HR 68 | Temp 97.5°F | Resp 18 | Ht 72.0 in | Wt 206.0 lb

## 2015-02-06 DIAGNOSIS — Z951 Presence of aortocoronary bypass graft: Secondary | ICD-10-CM | POA: Diagnosis not present

## 2015-02-06 DIAGNOSIS — Z0000 Encounter for general adult medical examination without abnormal findings: Secondary | ICD-10-CM

## 2015-02-06 DIAGNOSIS — Z8546 Personal history of malignant neoplasm of prostate: Secondary | ICD-10-CM

## 2015-02-06 DIAGNOSIS — Z8709 Personal history of other diseases of the respiratory system: Secondary | ICD-10-CM | POA: Insufficient documentation

## 2015-02-06 DIAGNOSIS — Z23 Encounter for immunization: Secondary | ICD-10-CM

## 2015-02-06 DIAGNOSIS — E785 Hyperlipidemia, unspecified: Secondary | ICD-10-CM
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
## 2015-03-06 DIAGNOSIS — E785 Hyperlipidemia, unspecified: Secondary | ICD-10-CM | POA: Diagnosis not present

## 2015-03-07 LAB — CBC WITH DIFFERENTIAL/PLATELET
Basophils Absolute: 0 10*3/uL (ref 0.0–0.2)
Basos: 0 %
EOS (ABSOLUTE): 0.6 10*3/uL — ABNORMAL HIGH (ref 0.0–0.4)
Eos: 8 %
Hematocrit: 44.3 % (ref 37.5–51.0)
Hemoglobin: 14.8 g/dL (ref 12.6–17.7)
Immature Grans (Abs): 0 10*3/uL (ref 0.0–0.1)
Immature Granulocytes: 0 %
Lymphocytes Absolute: 1.4 10*3/uL (ref 0.7–3.1)
Lymphs: 20 %
MCH: 31.8 pg (ref 26.6–33.0)
MCHC: 33.4 g/dL (ref 31.5–35.7)
MCV: 95 fL (ref 79–97)
Monocytes Absolute: 0.8 10*3/uL (ref 0.1–0.9)
Monocytes: 12 %
Neutrophils Absolute: 4.1 10*3/uL (ref 1.4–7.0)
Neutrophils: 60 %
Platelets: 148 10*3/uL — ABNORMAL LOW (ref 150–379)
RBC: 4.65 x10E6/uL (ref 4.14–5.80)
RDW: 14.1 % (ref 12.3–15.4)
WBC: 6.9 10*3/uL (ref 3.4–10.8)

## 2015-03-07 LAB — COMPREHENSIVE METABOLIC PANEL
ALT: 26 IU/L (ref 0–44)
AST: 24 IU/L (ref 0–40)
Albumin/Globulin Ratio: 2.3 (ref 1.1–2.5)
Albumin: 4.1 g/dL (ref 3.5–4.7)
Alkaline Phosphatase: 96 IU/L (ref 39–117)
BUN/Creatinine Ratio: 11 (ref 10–22)
BUN: 11 mg/dL (ref 8–27)
Bilirubin Total: 0.6 mg/dL (ref 0.0–1.2)
CO2: 21 mmol/L (ref 18–29)
Calcium: 8.9 mg/dL (ref 8.6–10.2)
Chloride: 105 mmol/L (ref 97–106)
Creatinine, Ser: 0.98 mg/dL (ref 0.76–1.27)
GFR calc Af Amer: 82 mL/min/{1.73_m2} (ref >59)
GFR calc non Af Amer: 71 mL/min/{1.73_m2} (ref >59)
Globulin, Total: 1.8 g/dL (ref 1.5–4.5)
Glucose: 107 mg/dL — ABNORMAL HIGH (ref 65–99)
Potassium: 4.4 mmol/L (ref 3.5–5.2)
Sodium: 141 mmol/L (ref 136–144)
Total Protein: 5.9 g/dL — ABNORMAL LOW (ref 6.0–8.5)

## 2015-03-07 LAB — LIPID PANEL
Chol/HDL Ratio: 3.6 ratio units (ref 0.0–5.0)
Cholesterol, Total: 143 mg/dL (ref 100–199)
HDL: 40 mg/dL (ref >39)
LDL Calculated: 85 mg/dL (ref 0–99)
Triglycerides: 88 mg/dL (ref 0–149)
VLDL Cholesterol Cal: 18 mg/dL (ref 5–40)

## 2015-03-07 LAB — TSH: TSH: 1.71 u[IU]/mL (ref 0.450–4.500)

## 2015-03-09 ENCOUNTER — Telehealth: Payer: Self-pay

## 2015-03-09 NOTE — Telephone Encounter (Signed)
Patient advised as directed below. Patient verbalized understanding and agrees with plan of care.  

## 2015-03-09 NOTE — Telephone Encounter (Signed)
-----   Message from Margo Common, [place] sent at 03/09/2015  8:08 AM EST ----- All blood tests in good shape. Continue present medications and follow up with cardiologist once a year as planned. Recheck labs in 6 months.

## 2015-03-16 ENCOUNTER — Telehealth: Payer: Self-pay | Admitting: Family Medicine

## 2015-03-16 MED ORDER — ATORVASTATIN CALCIUM 80 MG PO TABS: 80.0000 mg | ORAL_TABLET | Freq: Every day | ORAL | Status: DC

## 2015-03-16 NOTE — Telephone Encounter (Signed)
Pt needs refill on atorvastatin (LIPITOR) 80 MG tablet  His pharmacy is [facility].  Call number is  [phone] .  Pt's call back is [phone]

## 2015-04-30 ENCOUNTER — Telehealth: Payer: Self-pay

## 2015-04-30 NOTE — Telephone Encounter (Signed)
Fax from [facility] for refill on Doxazosin-per fax Dr. Bary Castilla was filling it last time. Please review. Patient sees Simona Huh, looks like. Thanks-aa

## 2015-05-06 ENCOUNTER — Telehealth: Payer: Self-pay | Admitting: Family Medicine

## 2015-05-06 NOTE — Telephone Encounter (Signed)
Pt contacted office for refill request on the following medications:  doxazosin (CARDURA) 4 MG tablet.  [facility]  CB#[phone]/MW

## 2015-05-07 MED ORDER — DOXAZOSIN MESYLATE 4 MG PO TABS: 4.0000 mg | ORAL_TABLET | Freq: Every day | ORAL | Status: DC

## 2015-05-07 NOTE — Telephone Encounter (Signed)
Called patient saying its ready.

## 2015-05-07 NOTE — Telephone Encounter (Signed)
Refill sent to [facility].

## 2015-06-26 ENCOUNTER — Ambulatory Visit
Admission: RE | Admit: 2015-06-26 | Discharge: 2015-06-26 | Disposition: A | Discharge status: Home / Self Care | Payer: Medicare Other | Source: Ambulatory Visit | Attending: Family Medicine | Admitting: Family Medicine

## 2015-06-26 ENCOUNTER — Encounter: Payer: Self-pay | Admitting: Family Medicine

## 2015-06-26 ENCOUNTER — Ambulatory Visit (INDEPENDENT_AMBULATORY_CARE_PROVIDER_SITE_OTHER): Payer: Medicare Other | Admitting: Family Medicine

## 2015-06-26 VITALS — BP 104/60 | HR 94 | Temp 97.7°F | Resp 12 | Wt 207.0 lb

## 2015-06-26 DIAGNOSIS — Z8709 Personal history of other diseases of the respiratory system: Secondary | ICD-10-CM | POA: Diagnosis not present

## 2015-06-26 DIAGNOSIS — R0600 Dyspnea, unspecified: Secondary | ICD-10-CM

## 2015-06-26 DIAGNOSIS — Z951 Presence of aortocoronary bypass graft: Secondary | ICD-10-CM | POA: Diagnosis not present
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
## 2015-06-27 LAB — CBC WITH DIFFERENTIAL/PLATELET
Basophils Absolute: 0 10*3/uL (ref 0.0–0.2)
Basos: 0 %
EOS (ABSOLUTE): 0.4 10*3/uL (ref 0.0–0.4)
Eos: 6 %
Hematocrit: 40.4 % (ref 37.5–51.0)
Hemoglobin: 13.7 g/dL (ref 12.6–17.7)
Immature Grans (Abs): 0 10*3/uL (ref 0.0–0.1)
Immature Granulocytes: 0 %
Lymphocytes Absolute: 1.3 10*3/uL (ref 0.7–3.1)
Lymphs: 19 %
MCH: 32.2 pg (ref 26.6–33.0)
MCHC: 33.9 g/dL (ref 31.5–35.7)
MCV: 95 fL (ref 79–97)
Monocytes Absolute: 0.8 10*3/uL (ref 0.1–0.9)
Monocytes: 11 %
Neutrophils Absolute: 4.2 10*3/uL (ref 1.4–7.0)
Neutrophils: 64 %
Platelets: 152 10*3/uL (ref 150–379)
RBC: 4.25 x10E6/uL (ref 4.14–5.80)
RDW: 13.9 % (ref 12.3–15.4)
WBC: 6.7 10*3/uL (ref 3.4–10.8)

## 2015-06-27 LAB — COMPREHENSIVE METABOLIC PANEL
ALT: 23 IU/L (ref 0–44)
AST: 25 IU/L (ref 0–40)
Albumin/Globulin Ratio: 2.3 (ref 1.1–2.5)
Albumin: 4.2 g/dL (ref 3.5–4.7)
Alkaline Phosphatase: 82 IU/L (ref 39–117)
BUN/Creatinine Ratio: 15 (ref 10–22)
BUN: 18 mg/dL (ref 8–27)
Bilirubin Total: 0.5 mg/dL (ref 0.0–1.2)
CO2: 19 mmol/L (ref 18–29)
Calcium: 8.5 mg/dL — ABNORMAL LOW (ref 8.6–10.2)
Chloride: 103 mmol/L (ref 96–106)
Creatinine, Ser: 1.19 mg/dL (ref 0.76–1.27)
GFR calc Af Amer: 65 mL/min/{1.73_m2} (ref >59)
GFR calc non Af Amer: 56 mL/min/{1.73_m2} — ABNORMAL LOW (ref >59)
Globulin, Total: 1.8 g/dL (ref 1.5–4.5)
Glucose: 128 mg/dL — ABNORMAL HIGH (ref 65–99)
Potassium: 4.7 mmol/L (ref 3.5–5.2)
Sodium: 138 mmol/L (ref 134–144)
Total Protein: 6 g/dL (ref 6.0–8.5)

## 2015-06-27 LAB — BRAIN NATRIURETIC PEPTIDE: BNP: 466.4 pg/mL — ABNORMAL HIGH (ref 0.0–100.0)

## 2015-06-29 ENCOUNTER — Telehealth: Payer: Self-pay

## 2015-06-29 ENCOUNTER — Other Ambulatory Visit: Payer: Self-pay | Admitting: Family Medicine

## 2015-06-29 ENCOUNTER — Telehealth: Payer: Self-pay | Admitting: Family Medicine

## 2015-06-29 MED ORDER — FUROSEMIDE 20 MG PO TABS: 20.0000 mg | ORAL_TABLET | Freq: Every day | ORAL | Status: DC

## 2015-06-29 NOTE — Telephone Encounter (Signed)
-----   Message from [place], [place] sent at 06/26/2015  5:11 PM EST ----- No sign of congestion on chest x-ray but heart size is enlarged. Awaiting lab tests.

## 2015-06-29 NOTE — Telephone Encounter (Signed)
Pt;s daughter scheduled appointment with Dr Ubaldo Glassing 06/30/15 at 10:45.Information faxed

## 2015-06-29 NOTE — Telephone Encounter (Signed)
Patient's daughter Charles Cook advised as directed below.

## 2015-06-30 ENCOUNTER — Other Ambulatory Visit: Payer: Self-pay | Admitting: Cardiology

## 2015-06-30 DIAGNOSIS — I10 Essential (primary) hypertension: Secondary | ICD-10-CM | POA: Diagnosis not present

## 2015-06-30 DIAGNOSIS — I25810 Atherosclerosis of coronary artery bypass graft(s) without angina pectoris: Secondary | ICD-10-CM | POA: Diagnosis not present

## 2015-06-30 DIAGNOSIS — R140 Abdominal distension (gaseous): Secondary | ICD-10-CM

## 2015-06-30 DIAGNOSIS — E782 Mixed hyperlipidemia: Secondary | ICD-10-CM | POA: Diagnosis not present

## 2015-06-30 DIAGNOSIS — R0602 Shortness of breath: Secondary | ICD-10-CM | POA: Diagnosis not present

## 2015-07-03 DIAGNOSIS — R0602 Shortness of breath: Secondary | ICD-10-CM | POA: Diagnosis not present

## 2015-07-06 ENCOUNTER — Ambulatory Visit
Admission: RE | Admit: 2015-07-06 | Discharge: 2015-07-06 | Disposition: A | Discharge status: Home / Self Care | Payer: Medicare Other | Source: Ambulatory Visit | Attending: Cardiology | Admitting: Cardiology

## 2015-07-06 DIAGNOSIS — R140 Abdominal distension (gaseous): Secondary | ICD-10-CM | POA: Diagnosis not present

## 2015-07-06 DIAGNOSIS — K8020 Calculus of gallbladder without cholecystitis without obstruction: Secondary | ICD-10-CM | POA: Insufficient documentation

## 2015-07-07 ENCOUNTER — Encounter: Payer: Self-pay | Admitting: *Deleted

## 2015-07-08 ENCOUNTER — Ambulatory Visit (INDEPENDENT_AMBULATORY_CARE_PROVIDER_SITE_OTHER): Payer: Medicare Other | Admitting: General Surgery

## 2015-07-08 ENCOUNTER — Encounter: Payer: Self-pay | Admitting: General Surgery

## 2015-07-08 VITALS — BP 112/82 | HR 86 | Resp 14 | Ht 72.0 in | Wt 205.0 lb

## 2015-07-08 DIAGNOSIS — K8020 Calculus of gallbladder without cholecystitis without obstruction: Secondary | ICD-10-CM | POA: Diagnosis not present

## 2015-07-08 DIAGNOSIS — R0600 Dyspnea, unspecified: Secondary | ICD-10-CM
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
## 2015-07-08 NOTE — Patient Instructions (Signed)
Patient to return as needed. 

## 2015-07-09 DIAGNOSIS — E782 Mixed hyperlipidemia: Secondary | ICD-10-CM | POA: Diagnosis not present

## 2015-07-09 DIAGNOSIS — I25810 Atherosclerosis of coronary artery bypass graft(s) without angina pectoris: Secondary | ICD-10-CM | POA: Diagnosis not present

## 2015-07-09 DIAGNOSIS — I481 Persistent atrial fibrillation: Secondary | ICD-10-CM | POA: Diagnosis not present

## 2015-07-09 DIAGNOSIS — I5021 Acute systolic (congestive) heart failure: Secondary | ICD-10-CM | POA: Diagnosis not present

## 2015-07-09 DIAGNOSIS — I420 Dilated cardiomyopathy: Secondary | ICD-10-CM | POA: Insufficient documentation

## 2015-07-09 DIAGNOSIS — R000 Tachycardia, unspecified: Secondary | ICD-10-CM | POA: Diagnosis not present

## 2015-07-09 DIAGNOSIS — I4819 Other persistent atrial fibrillation: Secondary | ICD-10-CM | POA: Insufficient documentation

## 2015-07-09 DIAGNOSIS — I10 Essential (primary) hypertension: Secondary | ICD-10-CM | POA: Diagnosis not present

## 2015-07-13 DIAGNOSIS — I10 Essential (primary) hypertension: Secondary | ICD-10-CM | POA: Diagnosis not present

## 2015-07-13 DIAGNOSIS — I255 Ischemic cardiomyopathy: Secondary | ICD-10-CM | POA: Diagnosis not present

## 2015-07-13 DIAGNOSIS — E782 Mixed hyperlipidemia: Secondary | ICD-10-CM | POA: Diagnosis not present

## 2015-07-13 DIAGNOSIS — I420 Dilated cardiomyopathy: Secondary | ICD-10-CM | POA: Diagnosis not present

## 2015-07-13 DIAGNOSIS — I4891 Unspecified atrial fibrillation: Secondary | ICD-10-CM | POA: Diagnosis not present

## 2015-07-13 DIAGNOSIS — I5021 Acute systolic (congestive) heart failure: Secondary | ICD-10-CM | POA: Diagnosis not present

## 2015-07-13 DIAGNOSIS — I25810 Atherosclerosis of coronary artery bypass graft(s) without angina pectoris: Secondary | ICD-10-CM | POA: Diagnosis not present

## 2015-07-13 DIAGNOSIS — I481 Persistent atrial fibrillation: Secondary | ICD-10-CM | POA: Diagnosis not present

## 2015-07-14 ENCOUNTER — Ambulatory Visit: Payer: Self-pay | Admitting: General Surgery

## 2015-07-20 DIAGNOSIS — I420 Dilated cardiomyopathy: Secondary | ICD-10-CM | POA: Diagnosis not present

## 2015-07-20 DIAGNOSIS — K8020 Calculus of gallbladder without cholecystitis without obstruction: Secondary | ICD-10-CM | POA: Diagnosis not present

## 2015-07-20 DIAGNOSIS — E782 Mixed hyperlipidemia: Secondary | ICD-10-CM | POA: Diagnosis not present

## 2015-07-20 DIAGNOSIS — I481 Persistent atrial fibrillation: Secondary | ICD-10-CM | POA: Diagnosis not present

## 2015-07-20 DIAGNOSIS — I4891 Unspecified atrial fibrillation: Secondary | ICD-10-CM | POA: Diagnosis not present

## 2015-07-20 DIAGNOSIS — Z79899 Other long term (current) drug therapy: Secondary | ICD-10-CM | POA: Diagnosis not present

## 2015-07-20 DIAGNOSIS — I10 Essential (primary) hypertension: Secondary | ICD-10-CM | POA: Diagnosis not present

## 2015-07-20 DIAGNOSIS — I25810 Atherosclerosis of coronary artery bypass graft(s) without angina pectoris: Secondary | ICD-10-CM | POA: Diagnosis not present

## 2015-08-03 DIAGNOSIS — I255 Ischemic cardiomyopathy: Secondary | ICD-10-CM | POA: Diagnosis not present

## 2015-08-03 DIAGNOSIS — I481 Persistent atrial fibrillation: Secondary | ICD-10-CM | POA: Diagnosis not present

## 2015-08-03 DIAGNOSIS — I25810 Atherosclerosis of coronary artery bypass graft(s) without angina pectoris: Secondary | ICD-10-CM | POA: Diagnosis not present

## 2015-08-03 DIAGNOSIS — I420 Dilated cardiomyopathy: Secondary | ICD-10-CM | POA: Diagnosis not present

## 2015-08-12 DIAGNOSIS — I4891 Unspecified atrial fibrillation: Secondary | ICD-10-CM | POA: Diagnosis not present

## 2015-08-16 ENCOUNTER — Other Ambulatory Visit: Payer: Self-pay | Admitting: Family Medicine

## 2015-08-28 DIAGNOSIS — I25810 Atherosclerosis of coronary artery bypass graft(s) without angina pectoris: Secondary | ICD-10-CM | POA: Diagnosis not present

## 2015-08-28 DIAGNOSIS — I481 Persistent atrial fibrillation: Secondary | ICD-10-CM | POA: Diagnosis not present

## 2015-08-28 DIAGNOSIS — I10 Essential (primary) hypertension: Secondary | ICD-10-CM | POA: Diagnosis not present

## 2015-08-28 DIAGNOSIS — F3289 Other specified depressive episodes: Secondary | ICD-10-CM | POA: Diagnosis not present

## 2015-08-28 DIAGNOSIS — E782 Mixed hyperlipidemia: Secondary | ICD-10-CM | POA: Diagnosis not present

## 2015-08-28 DIAGNOSIS — I4891 Unspecified atrial fibrillation: Secondary | ICD-10-CM | POA: Diagnosis not present

## 2015-08-28 DIAGNOSIS — I420 Dilated cardiomyopathy: Secondary | ICD-10-CM | POA: Diagnosis not present

## 2015-09-21 ENCOUNTER — Encounter: Payer: Self-pay | Admitting: Family Medicine

## 2015-09-21 ENCOUNTER — Ambulatory Visit (INDEPENDENT_AMBULATORY_CARE_PROVIDER_SITE_OTHER): Payer: Medicare Other | Admitting: Family Medicine

## 2015-09-21 VITALS — BP 102/54 | HR 88 | Temp 98.6°F | Resp 12 | Wt 184.0 lb

## 2015-09-21 DIAGNOSIS — I420 Dilated cardiomyopathy: Secondary | ICD-10-CM

## 2015-09-21 DIAGNOSIS — J069 Acute upper respiratory infection, unspecified: Secondary | ICD-10-CM

## 2015-09-21 DIAGNOSIS — I481 Persistent atrial fibrillation: Secondary | ICD-10-CM | POA: Diagnosis not present

## 2015-09-21 DIAGNOSIS — I4819 Other persistent atrial fibrillation: Secondary | ICD-10-CM

## 2015-09-21 MED ORDER — BENZONATATE 100 MG PO CAPS: 100.0000 mg | ORAL_CAPSULE | Freq: Three times a day (TID) | ORAL | Status: DC | PRN
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
## 2015-10-05 ENCOUNTER — Observation Stay: Payer: Medicare Other

## 2015-10-05 ENCOUNTER — Emergency Department: Payer: Medicare Other

## 2015-10-05 ENCOUNTER — Ambulatory Visit (INDEPENDENT_AMBULATORY_CARE_PROVIDER_SITE_OTHER): Payer: Medicare Other | Admitting: Family Medicine

## 2015-10-05 ENCOUNTER — Encounter: Payer: Self-pay | Admitting: Family Medicine

## 2015-10-05 ENCOUNTER — Inpatient Hospital Stay
Admission: EM | Admit: 2015-10-05 | Discharge: 2015-10-09 | DRG: 871 | Disposition: A | Discharge status: Home Health | Payer: Medicare Other | Attending: Internal Medicine | Admitting: Internal Medicine

## 2015-10-05 VITALS — BP 118/82 | HR 106 | Temp 99.1°F | Resp 17 | Wt 183.2 lb

## 2015-10-05 DIAGNOSIS — N179 Acute kidney failure, unspecified: Secondary | ICD-10-CM

## 2015-10-05 DIAGNOSIS — K8020 Calculus of gallbladder without cholecystitis without obstruction: Secondary | ICD-10-CM | POA: Diagnosis not present

## 2015-10-05 DIAGNOSIS — Z8614 Personal history of Methicillin resistant Staphylococcus aureus infection: Secondary | ICD-10-CM

## 2015-10-05 DIAGNOSIS — I252 Old myocardial infarction: Secondary | ICD-10-CM

## 2015-10-05 DIAGNOSIS — I4891 Unspecified atrial fibrillation: Secondary | ICD-10-CM | POA: Diagnosis present

## 2015-10-05 DIAGNOSIS — R112 Nausea with vomiting, unspecified: Secondary | ICD-10-CM

## 2015-10-05 DIAGNOSIS — R55 Syncope and collapse: Secondary | ICD-10-CM | POA: Diagnosis not present

## 2015-10-05 DIAGNOSIS — R05 Cough: Secondary | ICD-10-CM

## 2015-10-05 DIAGNOSIS — A419 Sepsis, unspecified organism: Secondary | ICD-10-CM | POA: Diagnosis not present

## 2015-10-05 DIAGNOSIS — N170 Acute kidney failure with tubular necrosis: Secondary | ICD-10-CM | POA: Diagnosis not present

## 2015-10-05 DIAGNOSIS — E871 Hypo-osmolality and hyponatremia: Secondary | ICD-10-CM

## 2015-10-05 DIAGNOSIS — R531 Weakness: Secondary | ICD-10-CM | POA: Diagnosis not present

## 2015-10-05 DIAGNOSIS — R059 Cough, unspecified: Secondary | ICD-10-CM

## 2015-10-05 DIAGNOSIS — I110 Hypertensive heart disease with heart failure: Secondary | ICD-10-CM | POA: Diagnosis not present

## 2015-10-05 DIAGNOSIS — Z809 Family history of malignant neoplasm, unspecified: Secondary | ICD-10-CM

## 2015-10-05 DIAGNOSIS — Z8546 Personal history of malignant neoplasm of prostate: Secondary | ICD-10-CM

## 2015-10-05 DIAGNOSIS — Z7982 Long term (current) use of aspirin: Secondary | ICD-10-CM

## 2015-10-05 DIAGNOSIS — Z951 Presence of aortocoronary bypass graft: Secondary | ICD-10-CM

## 2015-10-05 DIAGNOSIS — R17 Unspecified jaundice: Secondary | ICD-10-CM

## 2015-10-05 DIAGNOSIS — E785 Hyperlipidemia, unspecified: Secondary | ICD-10-CM | POA: Diagnosis present

## 2015-10-05 DIAGNOSIS — I951 Orthostatic hypotension: Secondary | ICD-10-CM | POA: Diagnosis present

## 2015-10-05 DIAGNOSIS — Z7951 Long term (current) use of inhaled steroids: Secondary | ICD-10-CM

## 2015-10-05 DIAGNOSIS — J40 Bronchitis, not specified as acute or chronic: Secondary | ICD-10-CM | POA: Diagnosis present

## 2015-10-05 DIAGNOSIS — R1310 Dysphagia, unspecified: Secondary | ICD-10-CM | POA: Diagnosis present

## 2015-10-05 DIAGNOSIS — I2510 Atherosclerotic heart disease of native coronary artery without angina pectoris: Secondary | ICD-10-CM | POA: Diagnosis present

## 2015-10-05 DIAGNOSIS — I509 Heart failure, unspecified: Secondary | ICD-10-CM | POA: Diagnosis not present

## 2015-10-05 DIAGNOSIS — J189 Pneumonia, unspecified organism: Secondary | ICD-10-CM

## 2015-10-05 DIAGNOSIS — R7989 Other specified abnormal findings of blood chemistry: Secondary | ICD-10-CM

## 2015-10-05 DIAGNOSIS — Z7901 Long term (current) use of anticoagulants: Secondary | ICD-10-CM

## 2015-10-05 DIAGNOSIS — Z79899 Other long term (current) drug therapy: Secondary | ICD-10-CM

## 2015-10-05 DIAGNOSIS — I248 Other forms of acute ischemic heart disease: Secondary | ICD-10-CM | POA: Diagnosis not present

## 2015-10-05 DIAGNOSIS — I481 Persistent atrial fibrillation: Secondary | ICD-10-CM

## 2015-10-05 DIAGNOSIS — Z888 Allergy status to other drugs, medicaments and biological substances status: Secondary | ICD-10-CM

## 2015-10-05 DIAGNOSIS — I4819 Other persistent atrial fibrillation: Secondary | ICD-10-CM

## 2015-10-05 DIAGNOSIS — F329 Major depressive disorder, single episode, unspecified: Secondary | ICD-10-CM | POA: Diagnosis present

## 2015-10-05 DIAGNOSIS — R634 Abnormal weight loss: Secondary | ICD-10-CM | POA: Diagnosis present

## 2015-10-05 DIAGNOSIS — Z8249 Family history of ischemic heart disease and other diseases of the circulatory system: Secondary | ICD-10-CM

## 2015-10-05 DIAGNOSIS — B349 Viral infection, unspecified: Secondary | ICD-10-CM | POA: Diagnosis present

## 2015-10-05 DIAGNOSIS — N400 Enlarged prostate without lower urinary tract symptoms: Secondary | ICD-10-CM | POA: Diagnosis present

## 2015-10-05 DIAGNOSIS — R778 Other specified abnormalities of plasma proteins: Secondary | ICD-10-CM

## 2015-10-05 DIAGNOSIS — I255 Ischemic cardiomyopathy: Secondary | ICD-10-CM | POA: Diagnosis present

## 2015-10-05 DIAGNOSIS — Z833 Family history of diabetes mellitus: Secondary | ICD-10-CM

## 2015-10-05 HISTORY — DX: Unspecified atrial fibrillation: I48.91

## 2015-10-05 HISTORY — DX: Cardiomyopathy, unspecified: I42.9

## 2015-10-05 HISTORY — DX: Atherosclerotic heart disease of native coronary artery without angina pectoris: I25.10

## 2015-10-05 HISTORY — DX: Benign prostatic hyperplasia without lower urinary tract symptoms: N40.0

## 2015-10-05 HISTORY — DX: Diaphragmatic hernia without obstruction or gangrene: K44.9

## 2015-10-05 LAB — URINALYSIS COMPLETE WITH MICROSCOPIC (ARMC ONLY)
Bacteria, UA: NONE SEEN
Bilirubin Urine: NEGATIVE
Glucose, UA: NEGATIVE mg/dL
Hgb urine dipstick: NEGATIVE
Ketones, ur: NEGATIVE mg/dL
Leukocytes, UA: NEGATIVE
Nitrite: NEGATIVE
Protein, ur: 100 mg/dL — AB
Specific Gravity, Urine: 1.014 (ref 1.005–1.030)
pH: 5 (ref 5.0–8.0)

## 2015-10-05 LAB — CBC
HCT: 38 % — ABNORMAL LOW (ref 40.0–52.0)
Hemoglobin: 13.1 g/dL (ref 13.0–18.0)
MCH: 32.2 pg (ref 26.0–34.0)
MCHC: 34.4 g/dL (ref 32.0–36.0)
MCV: 93.9 fL (ref 80.0–100.0)
Platelets: 165 10*3/uL (ref 150–440)
RBC: 4.05 MIL/uL — ABNORMAL LOW (ref 4.40–5.90)
RDW: 15.7 % — ABNORMAL HIGH (ref 11.5–14.5)
WBC: 13.1 10*3/uL — ABNORMAL HIGH (ref 3.8–10.6)

## 2015-10-05 LAB — BASIC METABOLIC PANEL
Anion gap: 11 (ref 5–15)
BUN: 15 mg/dL (ref 6–20)
CO2: 21 mmol/L — ABNORMAL LOW (ref 22–32)
Calcium: 8.1 mg/dL — ABNORMAL LOW (ref 8.9–10.3)
Chloride: 96 mmol/L — ABNORMAL LOW (ref 101–111)
Creatinine, Ser: 1.21 mg/dL (ref 0.61–1.24)
GFR calc Af Amer: >60 mL/min (ref >60)
GFR calc non Af Amer: 54 mL/min — ABNORMAL LOW (ref >60)
Glucose, Bld: 141 mg/dL — ABNORMAL HIGH (ref 65–99)
Potassium: 4.3 mmol/L (ref 3.5–5.1)
Sodium: 128 mmol/L — ABNORMAL LOW (ref 135–145)

## 2015-10-05 LAB — TROPONIN I
Troponin I: 0.07 ng/mL — ABNORMAL HIGH (ref <0.031)
Troponin I: <0.03 ng/mL (ref <0.031)

## 2015-10-05 LAB — GLUCOSE, CAPILLARY: Glucose-Capillary: 151 mg/dL — ABNORMAL HIGH (ref 65–99)

## 2015-10-05 MED ORDER — ACETAMINOPHEN 650 MG RE SUPP: 650.0000 mg | Freq: Four times a day (QID) | RECTAL | Status: DC | PRN

## 2015-10-05 MED ORDER — ATORVASTATIN CALCIUM 20 MG PO TABS
80.0000 mg | ORAL_TABLET | Freq: Every day | ORAL | Status: DC
  Administered 2015-10-05 – 2015-10-08 (×4): 80 mg via ORAL
  Filled 2015-10-05 (×4): qty 4

## 2015-10-05 MED ORDER — SERTRALINE HCL 50 MG PO TABS
25.0000 mg | ORAL_TABLET | Freq: Every day | ORAL | Status: DC
  Administered 2015-10-05 – 2015-10-07 (×3): 25 mg via ORAL
  Administered 2015-10-08: 50 mg via ORAL
  Filled 2015-10-05 (×4): qty 1

## 2015-10-05 MED ORDER — FLUTICASONE PROPIONATE 50 MCG/ACT NA SUSP
2.0000 | Freq: Every day | NASAL | Status: DC | PRN
  Administered 2015-10-06: 2 via NASAL
  Filled 2015-10-05: qty 16

## 2015-10-05 MED ORDER — APIXABAN 5 MG PO TABS
5.0000 mg | ORAL_TABLET | Freq: Two times a day (BID) | ORAL | Status: DC
  Administered 2015-10-05 – 2015-10-07 (×4): 5 mg via ORAL
  Filled 2015-10-05 (×4): qty 1

## 2015-10-05 MED ORDER — DIPHENHYDRAMINE HCL 25 MG PO CAPS
25.0000 mg | ORAL_CAPSULE | Freq: Every day | ORAL | Status: DC
  Administered 2015-10-05: 25 mg via ORAL
  Filled 2015-10-05 (×3): qty 1

## 2015-10-05 MED ORDER — VITAMIN B-12 1000 MCG PO TABS
5000.0000 ug | ORAL_TABLET | Freq: Every day | ORAL | Status: DC
Start: 2015-10-05 — End: 2015-10-09
  Administered 2015-10-06 – 2015-10-09 (×3): 5000 ug via ORAL
  Filled 2015-10-05 (×4): qty 5

## 2015-10-05 MED ORDER — ONDANSETRON HCL 4 MG PO TABS: 4.0000 mg | ORAL_TABLET | Freq: Three times a day (TID) | ORAL | Status: DC | PRN

## 2015-10-05 MED ORDER — SODIUM CHLORIDE 0.9 % IV SOLN
INTRAVENOUS | Status: AC
  Administered 2015-10-05 – 2015-10-06 (×2): via INTRAVENOUS

## 2015-10-05 MED ORDER — SODIUM CHLORIDE 0.9 % IV BOLUS (SEPSIS)
1000.0000 mL | Freq: Once | INTRAVENOUS | Status: AC
  Administered 2015-10-05: 1000 mL via INTRAVENOUS

## 2015-10-05 MED ORDER — DIGOXIN 125 MCG PO TABS
0.1250 mg | ORAL_TABLET | Freq: Every day | ORAL | Status: DC
Start: 2015-10-05 — End: 2015-10-09
  Administered 2015-10-06 – 2015-10-09 (×4): 0.125 mg via ORAL
  Filled 2015-10-05 (×4): qty 1

## 2015-10-05 MED ORDER — ACETAMINOPHEN 325 MG PO TABS
650.0000 mg | ORAL_TABLET | Freq: Four times a day (QID) | ORAL | Status: DC | PRN
  Administered 2015-10-06: 650 mg via ORAL
  Filled 2015-10-05: qty 2

## 2015-10-05 MED ORDER — ONDANSETRON HCL 4 MG/2ML IJ SOLN
4.0000 mg | Freq: Four times a day (QID) | INTRAMUSCULAR | Status: DC | PRN
  Administered 2015-10-06 (×2): 4 mg via INTRAVENOUS
  Filled 2015-10-05 (×2): qty 2

## 2015-10-05 MED ORDER — DOCUSATE SODIUM 100 MG PO CAPS
100.0000 mg | ORAL_CAPSULE | Freq: Two times a day (BID) | ORAL | Status: DC
  Administered 2015-10-06: 100 mg via ORAL
  Filled 2015-10-05 (×2): qty 1

## 2015-10-05 MED ORDER — SODIUM CHLORIDE 0.9% FLUSH
3.0000 mL | Freq: Two times a day (BID) | INTRAVENOUS | Status: DC
  Administered 2015-10-05 – 2015-10-09 (×6): 3 mL via INTRAVENOUS

## 2015-10-05 MED ORDER — ASPIRIN EC 81 MG PO TBEC
81.0000 mg | DELAYED_RELEASE_TABLET | Freq: Every day | ORAL | Status: DC
  Administered 2015-10-05 – 2015-10-08 (×4): 81 mg via ORAL
  Filled 2015-10-05 (×4): qty 1

## 2015-10-05 MED ORDER — METOPROLOL TARTRATE 25 MG PO TABS
25.0000 mg | ORAL_TABLET | Freq: Two times a day (BID) | ORAL | Status: DC
  Administered 2015-10-05: 25 mg via ORAL
  Filled 2015-10-05: qty 1

## 2015-10-05 MED ORDER — ADULT MULTIVITAMIN W/MINERALS CH
1.0000 | ORAL_TABLET | Freq: Every day | ORAL | Status: DC
  Administered 2015-10-06 – 2015-10-09 (×4): 1 via ORAL
  Filled 2015-10-05 (×4): qty 1

## 2015-10-05 MED ORDER — NIACIN ER (ANTIHYPERLIPIDEMIC) 500 MG PO TBCR
500.0000 mg | EXTENDED_RELEASE_TABLET | Freq: Every day | ORAL | Status: DC
  Administered 2015-10-05 – 2015-10-08 (×4): 500 mg via ORAL
  Filled 2015-10-05 (×8): qty 1

## 2015-10-05 MED ORDER — ONDANSETRON HCL 4 MG PO TABS: 4.0000 mg | ORAL_TABLET | Freq: Four times a day (QID) | ORAL | Status: DC | PRN

## 2015-10-05 MED ORDER — AMIODARONE HCL 200 MG PO TABS
200.0000 mg | ORAL_TABLET | Freq: Every day | ORAL | Status: DC
  Administered 2015-10-06 – 2015-10-09 (×4): 200 mg via ORAL
  Filled 2015-10-05 (×4): qty 1

## 2015-10-05 NOTE — ED Notes (Signed)
Pt transported via EMS for syncope episode at [facility]

## 2015-10-05 NOTE — Patient Instructions (Signed)
We will call you with the lab and x-ray results. 
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
## 2015-10-05 NOTE — Progress Notes (Signed)
Subjective:     Patient ID: Charles Cook, male   DOB: 01-30-1930, 82 y.o.   MRN: [id number]  HPI  Chief Complaint  Patient presents with  . Nausea    Patient comes in office today with daughter who has concerns about patient having nausea and chills/sweats. Daughter states that patient has been shaking uncontrollably at times and has been dry heaving over the past 4 days  Reports hx of gallstones and cough along with general weakness. He is on digoxin for a.fib.   Review of Systems     Objective:   Physical Exam  Constitutional: He appears well-developed and well-nourished. He has a sickly appearance.  Cardiovascular:  tachycardic  Pulmonary/Chest:  Left basilar crackles  Abdominal: Soft. There is no tenderness (exam provokes dry heaves.).  Musculoskeletal: He exhibits no edema (of lower extremities).       Assessment:    1. Non-intractable vomiting with nausea, vomiting of unspecified type: concern for dehydration - ondansetron (ZOFRAN) 4 MG tablet; Take 1 tablet (4 mg total) by mouth every 8 (eight) hours as needed for nausea or vomiting.  Dispense: 20 tablet; Refill: 0   2. Cough: concern for pneumonia  3. Gallstone: concern for choleducolithiasis  4. Atrial fibrillation: concern for dig toxicity.    Plan:    Initially labs, CXR, and ultrasound planned but patient passed out on the way to the lab. He was sent to the hospital via EMS.

## 2015-10-05 NOTE — ED Notes (Signed)
Orthostatics Laying BP 136/68 Pulse 109 Sitting BP 141/74 Pulse 107 Standing BP 108/66 Pulse 107

## 2015-10-05 NOTE — ED Notes (Signed)
Patient transported to Ultrasound 

## 2015-10-05 NOTE — ED Notes (Signed)
Pt c/o weakness over the last 3-4 weeks and has started skipping supper for the last 90 days - pt has lost 25 lbs - pt states he has infrequent urination with some urgency without relief - Today pt reported he felt dizzy and "everything went black" - Pt states cough and congestion (2 weeks ago had a cold that has not completely relieved itself)
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
## 2015-10-05 NOTE — ED Notes (Signed)
Patient eating meal tray however daughter complaining about alarms going off in room. Instructed patient and family member the oxygen saturation monitor should remain on patient's finger or it will alarm. Medical equipment placed back on finger.

## 2015-10-05 NOTE — ED Notes (Signed)
Pt is aware of need for urine sample

## 2015-10-06 DIAGNOSIS — I480 Paroxysmal atrial fibrillation: Secondary | ICD-10-CM | POA: Diagnosis not present

## 2015-10-06 DIAGNOSIS — I2510 Atherosclerotic heart disease of native coronary artery without angina pectoris: Secondary | ICD-10-CM | POA: Diagnosis not present

## 2015-10-06 DIAGNOSIS — A419 Sepsis, unspecified organism: Secondary | ICD-10-CM | POA: Diagnosis not present

## 2015-10-06 DIAGNOSIS — I4891 Unspecified atrial fibrillation: Secondary | ICD-10-CM | POA: Diagnosis not present

## 2015-10-06 DIAGNOSIS — R55 Syncope and collapse: Secondary | ICD-10-CM | POA: Diagnosis not present

## 2015-10-06 DIAGNOSIS — I25810 Atherosclerosis of coronary artery bypass graft(s) without angina pectoris: Secondary | ICD-10-CM | POA: Diagnosis not present

## 2015-10-06 DIAGNOSIS — N179 Acute kidney failure, unspecified: Secondary | ICD-10-CM | POA: Diagnosis not present

## 2015-10-06 LAB — BLOOD CULTURE ID PANEL (REFLEXED)
Acinetobacter baumannii: NOT DETECTED
Candida albicans: NOT DETECTED
Candida glabrata: NOT DETECTED
Candida krusei: NOT DETECTED
Candida parapsilosis: NOT DETECTED
Candida tropicalis: NOT DETECTED
Carbapenem resistance: NOT DETECTED
Enterobacter cloacae complex: NOT DETECTED
Enterobacteriaceae species: NOT DETECTED
Enterococcus species: NOT DETECTED
Escherichia coli: NOT DETECTED
Haemophilus influenzae: NOT DETECTED
Klebsiella oxytoca: NOT DETECTED
Klebsiella pneumoniae: NOT DETECTED
Listeria monocytogenes: NOT DETECTED
Methicillin resistance: NOT DETECTED
Neisseria meningitidis: NOT DETECTED
Proteus species: NOT DETECTED
Pseudomonas aeruginosa: NOT DETECTED
Serratia marcescens: NOT DETECTED
Staphylococcus aureus (BCID): NOT DETECTED
Staphylococcus species: DETECTED — AB
Streptococcus agalactiae: NOT DETECTED
Streptococcus pneumoniae: NOT DETECTED
Streptococcus pyogenes: NOT DETECTED
Streptococcus species: NOT DETECTED
Vancomycin resistance: NOT DETECTED

## 2015-10-06 LAB — TROPONIN I
Troponin I: <0.03 ng/mL (ref <0.031)
Troponin I: <0.03 ng/mL (ref <0.031)

## 2015-10-06 LAB — BASIC METABOLIC PANEL
Anion gap: 5 (ref 5–15)
BUN: 14 mg/dL (ref 6–20)
CO2: 25 mmol/L (ref 22–32)
Calcium: 7.7 mg/dL — ABNORMAL LOW (ref 8.9–10.3)
Chloride: 99 mmol/L — ABNORMAL LOW (ref 101–111)
Creatinine, Ser: 1.04 mg/dL (ref 0.61–1.24)
GFR calc Af Amer: >60 mL/min (ref >60)
GFR calc non Af Amer: >60 mL/min (ref >60)
Glucose, Bld: 145 mg/dL — ABNORMAL HIGH (ref 65–99)
Potassium: 3.9 mmol/L (ref 3.5–5.1)
Sodium: 129 mmol/L — ABNORMAL LOW (ref 135–145)

## 2015-10-06 LAB — CBC
HCT: 33.1 % — ABNORMAL LOW (ref 40.0–52.0)
Hemoglobin: 11.3 g/dL — ABNORMAL LOW (ref 13.0–18.0)
MCH: 31.8 pg (ref 26.0–34.0)
MCHC: 34 g/dL (ref 32.0–36.0)
MCV: 93.5 fL (ref 80.0–100.0)
Platelets: 142 10*3/uL — ABNORMAL LOW (ref 150–440)
RBC: 3.55 MIL/uL — ABNORMAL LOW (ref 4.40–5.90)
RDW: 15.5 % — ABNORMAL HIGH (ref 11.5–14.5)
WBC: 12.2 10*3/uL — ABNORMAL HIGH (ref 3.8–10.6)

## 2015-10-06 LAB — DIGOXIN LEVEL: Digoxin Level: 1.1 ng/mL (ref 0.8–2.0)

## 2015-10-06 MED ORDER — DOXYCYCLINE HYCLATE 100 MG PO TABS
100.0000 mg | ORAL_TABLET | Freq: Two times a day (BID) | ORAL | Status: DC
  Administered 2015-10-06 – 2015-10-09 (×7): 100 mg via ORAL
  Filled 2015-10-06 (×7): qty 1

## 2015-10-06 MED ORDER — VANCOMYCIN HCL IN DEXTROSE 750-5 MG/150ML-% IV SOLN
750.0000 mg | Freq: Two times a day (BID) | INTRAVENOUS | Status: DC
  Administered 2015-10-07: 750 mg via INTRAVENOUS
  Filled 2015-10-06 (×2): qty 150

## 2015-10-06 MED ORDER — METOPROLOL TARTRATE 50 MG PO TABS
50.0000 mg | ORAL_TABLET | Freq: Two times a day (BID) | ORAL | Status: DC
  Administered 2015-10-06 (×2): 50 mg via ORAL
  Filled 2015-10-06 (×3): qty 1

## 2015-10-06 MED ORDER — LEVOFLOXACIN IN D5W 750 MG/150ML IV SOLN
750.0000 mg | INTRAVENOUS | Status: DC
  Administered 2015-10-06: 750 mg via INTRAVENOUS
  Filled 2015-10-06: qty 150

## 2015-10-06 MED ORDER — VANCOMYCIN HCL IN DEXTROSE 1-5 GM/200ML-% IV SOLN
1000.0000 mg | INTRAVENOUS | Status: AC
  Administered 2015-10-06: 1000 mg via INTRAVENOUS
  Filled 2015-10-06: qty 200

## 2015-10-06 MED ORDER — METAXALONE 800 MG PO TABS
400.0000 mg | ORAL_TABLET | Freq: Two times a day (BID) | ORAL | Status: DC
  Administered 2015-10-06 (×2): 400 mg via ORAL
  Filled 2015-10-06 (×3): qty 0.5
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
## 2015-10-06 NOTE — Progress Notes (Signed)
Pharmacy Antibiotic Note  Charles Cook is a 82 y.o. male admitted on 10/05/2015 with sepsis.  Pharmacy has been consulted for vancomycin dosing.  BCID with both anaerobic bottles growing staph spp.; spoke with Dr. Tressia Miners.   Plan: Vancomycin 1 g IV x1 then Vancomycin 750 mg  IV every 12 hours.  Goal trough 15-20 mcg/mL.  Will need to monitor renal function carefully.  Trough before 4th dose of regimen, 5th overall dose, for 6/8 at 1530.  Ke 0.051, half life 13.6 h, Vd 57.2 L  Height: 5\' 10"  (177.8 cm) Weight: 180 lb 1.6 oz (81.693 kg) IBW/kg (Calculated) : 73  Temp (24hrs), Avg:99.5 F (37.5 C), Min:98.7 F (37.1 C), Max:100.9 F (38.3 C)   Recent Labs Lab 10/05/15 1550 10/06/15 0422  WBC 13.1* 12.2*  CREATININE 1.21 1.04    Estimated Creatinine Clearance: 55.6 mL/min (by C-G formula based on Cr of 1.04).    Allergies  Allergen Reactions  . Moxifloxacin Hcl In Nacl Other (See Comments)    Reaction:  Confusion     Antimicrobials this admission:   Dose adjustments this admission:   Microbiology results: 6/5  BCx: staph spp/pending   Thank you for allowing pharmacy to be a part of this patient's care.  Rocky Morel 10/06/2015 3:50 PM

## 2015-10-06 NOTE — Evaluation (Signed)
Physical Therapy Evaluation Patient Details Name: Charles Cook MRN: [id number] DOB: 1929-08-04 Today's Date: 10/06/2015   History of Present Illness  Pt with complaints of syncope with syncopal episode at doctors office yesterday. Pt with history of nausea x 2-3 days. Pt with history of CAD s/p CABG with EF at 30%. Pt with negative orthostatics at this time.   Clinical Impression  Pt is a pleasant 82 year old male who was admitted for syncopal event at doctor's office. Pt performs bed mobility independently, transfers with supervision, and ambulation with cga and rw. Pt demonstrates deficits with balance/endurance/strength. Pt needs to use RW for all mobility at this time as he is slightly unsteady with rw during turns. Would benefit from skilled PT to address above deficits and promote optimal return to PLOF. Recommend transition to [place] upon discharge from acute hospitalization.       Follow Up Recommendations Home health PT    Equipment Recommendations  Rolling walker with 5" wheels    Recommendations for Other Services       Precautions / Restrictions Precautions Precautions: Fall Restrictions Weight Bearing Restrictions: No      Mobility  Bed Mobility Overal bed mobility: Independent             General bed mobility comments: safe technique performed  Transfers Overall transfer level: Needs assistance Equipment used: Rolling walker (2 wheeled) Transfers: Sit to/from Stand Sit to Stand: Supervision         General transfer comment: safe technique with rw. Once standing, no LOB noted  Ambulation/Gait Ambulation/Gait assistance: Min guard Ambulation Distance (Feet): 200 Feet Assistive device: Rolling walker (2 wheeled) Gait Pattern/deviations: Step-through pattern     General Gait Details: ambulated using rw and reciprocal gait pattern. Safe technique with slight unsteadiness with turning. Min assist for 1 LOB during ambulation to bathroom. No SOB symptoms  or dizziness symptoms reported  Stairs            Wheelchair Mobility    Modified Rankin (Stroke Patients Only)       Balance Overall balance assessment: Needs assistance Sitting-balance support: Feet supported Sitting balance-Leahy Scale: Normal     Standing balance support: Bilateral upper extremity supported Standing balance-Leahy Scale: Good                               Pertinent Vitals/Pain Pain Assessment: Faces Faces Pain Scale: Hurts a little bit Pain Location: Lateral neck on B sides, aching feeling Pain Descriptors / Indicators: Aching Pain Intervention(s): Limited activity within patient's tolerance    Home Living Family/patient expects to be discharged to:: Private residence Living Arrangements: Spouse/significant other Available Help at Discharge: Family Type of Home: House Home Access: Level entry     Home Layout: One level Home Equipment: Environmental consultant - 2 wheels;Walker - 4 wheels;Shower seat;Cane - single point      Prior Function Level of Independence: Independent         Comments: no falls, community distances     Journalist, newspaper        Extremity/Trunk Assessment   Upper Extremity Assessment: Overall WFL for tasks assessed           Lower Extremity Assessment: Generalized weakness (grossly 4+/5)         Communication   Communication: No difficulties  Cognition Arousal/Alertness: Awake/alert Behavior During Therapy: WFL for tasks assessed/performed Overall Cognitive Status: Within Functional Limits for tasks assessed  General Comments      Exercises Other Exercises Other Exercises: Pt ambulated to bathroom with cga. Pt with slightly unsteadiness and needs min assist to sit down on lower toliet.       Assessment/Plan    PT Assessment Patient needs continued PT services  PT Diagnosis Difficulty walking;Generalized weakness   PT Problem List Decreased strength;Decreased  activity tolerance;Decreased balance  PT Treatment Interventions Gait training;DME instruction;Therapeutic activities;Therapeutic exercise;Balance training   PT Goals (Current goals can be found in the Care Plan section) Acute Rehab PT Goals Patient Stated Goal: to get stronger PT Goal Formulation: With patient Time For Goal Achievement: 10/20/15 Potential to Achieve Goals: Good    Frequency Min 2X/week   Barriers to discharge        Co-evaluation               End of Session Equipment Utilized During Treatment: Gait belt Activity Tolerance: Patient tolerated treatment well Patient left:  (in bathroom with family) Nurse Communication: Mobility status    Functional Assessment Tool Used: clinical judgement Functional Limitation: Mobility: Walking and moving around Mobility: Walking and Moving Around Current Status [phone]): At least 1 percent but less than 20 percent impaired, limited or restricted Mobility: Walking and Moving Around Goal Status [phone]): 0 percent impaired, limited or restricted    Time: 0843-0909 PT Time Calculation (min) (ACUTE ONLY): 26 min   Charges:   PT Evaluation $PT Eval Low Complexity: 1 Procedure PT Treatments $Therapeutic Activity: 8-22 mins   PT G Codes:   PT G-Codes **NOT FOR INPATIENT CLASS** Functional Assessment Tool Used: clinical judgement Functional Limitation: Mobility: Walking and moving around Mobility: Walking and Moving Around Current Status [phone]): At least 1 percent but less than 20 percent impaired, limited or restricted Mobility: Walking and Moving Around Goal Status [phone]): 0 percent impaired, limited or restricted    Joyia Riehle 10/06/2015, 11:49 AM  Greggory Stallion, PT, DPT [phone]

## 2015-10-06 NOTE — Progress Notes (Signed)
PT evaluation completed, patient OOB sitting in the chair, family at bedside, cervical pillow and incentive spirometer given to patient as per Md order.

## 2015-10-06 NOTE — Progress Notes (Signed)
Pharmacy Antibiotic Note  Charles Cook is a 82 y.o. male admitted on 10/05/2015 with acute bronchitis.  Pharmacy has been consulted for levofloxacin dosing.  Plan: Levofloxacin 750 mg IV Q24H  Height: 5\' 10"  (177.8 cm) Weight: 180 lb 1.6 oz (81.693 kg) IBW/kg (Calculated) : 73  Temp (24hrs), Avg:99.6 F (37.6 C), Min:98.7 F (37.1 C), Max:100.9 F (38.3 C)   Recent Labs Lab 10/05/15 1550 10/06/15 0422  WBC 13.1* 12.2*  CREATININE 1.21 1.04    Estimated Creatinine Clearance: 55.6 mL/min (by C-G formula based on Cr of 1.04).    Allergies  Allergen Reactions  . Moxifloxacin Hcl In Nacl Other (See Comments)    Reaction:  Confusion     Thank you for allowing pharmacy to be a part of this patient's care.  Laural Benes, Pharm.D., BCPS Clinical Pharmacist 10/06/2015 7:18 AM

## 2015-10-06 NOTE — Progress Notes (Signed)
[place] at [place] NAME: Charles Cook    MR#:  [id number]  DATE OF BIRTH:  Jul 07, 1929  SUBJECTIVE:  CHIEF COMPLAINT:   Chief Complaint  Patient presents with  . Near Syncope   - Feels stronger today, had an episode of fever this morning, chills at home for 1 day now - sodium improving, BP better - negative orthostatic hypotension  REVIEW OF SYSTEMS:  Review of Systems  Constitutional: Positive for fever, chills and malaise/fatigue.  HENT: Negative for ear discharge, ear pain and nosebleeds.   Eyes: Negative for blurred vision.  Respiratory: Positive for shortness of breath. Negative for cough and wheezing.   Cardiovascular: Negative for chest pain, palpitations and leg swelling.  Gastrointestinal: Negative for nausea, vomiting, abdominal pain, diarrhea and constipation.  Genitourinary: Negative for dysuria and urgency.  Musculoskeletal: Positive for neck pain. Negative for myalgias.  Neurological: Negative for dizziness, speech change, focal weakness, seizures and headaches.  Psychiatric/Behavioral: Negative for depression.    DRUG ALLERGIES:   Allergies  Allergen Reactions  . Moxifloxacin Hcl In Nacl Other (See Comments)    Reaction:  Confusion     VITALS:  Blood pressure 128/48, pulse 89, temperature 99 F (37.2 C), temperature source Oral, resp. rate 18, height 5\' 10"  (1.778 m), weight 81.693 kg (180 lb 1.6 oz), SpO2 95 %.  PHYSICAL EXAMINATION:  Physical Exam  GENERAL: 82 y.o.-year-old patient lying in the bed with no acute distress.  EYES: Pupils equal, round, reactive to light and accommodation. No scleral icterus. Extraocular muscles intact.  HEENT: Head atraumatic, normocephalic. Oropharynx and nasopharynx clear.  NECK: Supple, no jugular venous distention. No thyroid enlargement, no tenderness.  LUNGS: Normal breath sounds bilaterally, no wheezing, rales,rhonchi or crepitation. No use of accessory  muscles of respiration. Still has some right basilar crackles CARDIOVASCULAR: S1, S2 normal. No rubs, or gallops. 2/6 systolic murmur present ABDOMEN: Soft, nontender, nondistended. Bowel sounds present. No organomegaly or mass.  EXTREMITIES: No pedal edema, cyanosis, or clubbing.  NEUROLOGIC: Cranial nerves II through XII are intact. Muscle strength 5/5 in all extremities. Sensation intact. Gait not checked.  PSYCHIATRIC: The patient is alert and oriented x 3.  SKIN: No obvious rash, lesion, or ulcer.    LABORATORY PANEL:   CBC  Recent Labs Lab 10/06/15 0422  WBC 12.2*  HGB 11.3*  HCT 33.1*  PLT 142*   ------------------------------------------------------------------------------------------------------------------  Chemistries   Recent Labs Lab 10/06/15 0422  NA 129*  K 3.9  CL 99*  CO2 25  GLUCOSE 145*  BUN 14  CREATININE 1.04  CALCIUM 7.7*   ------------------------------------------------------------------------------------------------------------------  Cardiac Enzymes  Recent Labs Lab 10/06/15 0422  TROPONINI <0.03   ------------------------------------------------------------------------------------------------------------------  RADIOLOGY:  Dg Chest 2 View  10/05/2015  CLINICAL DATA:  Productive cough for the past 3 days. Syncope today. EXAM: CHEST  2 VIEW COMPARISON:  06/26/2015. FINDINGS: Stable mildly enlarged cardiac silhouette and post CABG changes. Mild linear density at the left lung base. Old, healed right rib fractures. Mild thoracic spine degenerative changes. Mild diffuse peribronchial thickening without significant change. IMPRESSION: 1. Stable mild bronchitic changes. 2. Stable mild cardiomegaly. 3. Interval small amount of linear atelectasis or scarring at the left lung base. Electronically Signed   By: Claudie Revering M.D.   On: 10/05/2015 16:34   Ct Head Wo Contrast  10/05/2015  CLINICAL DATA:  Syncopal episode EXAM: CT HEAD WITHOUT  CONTRAST TECHNIQUE: Contiguous axial images were obtained from the base of the  skull through the vertex without intravenous contrast. COMPARISON:  None. FINDINGS: Bony calvarium is intact. Mild atrophic changes are noted. Mild chronic white matter ischemic change is seen. No findings to suggest acute hemorrhage, acute infarction or space-occupying mass lesion are noted. IMPRESSION: Chronic atrophic and ischemic changes.  No acute abnormality noted. Electronically Signed   By: Inez Catalina M.D.   On: 10/05/2015 16:51   US Abdomen Limited Ruq  10/05/2015  CLINICAL DATA:  Right upper quadrant abdominal pain and nausea for the past 2 weeks. History of cholelithiasis. EXAM: US ABDOMEN LIMITED - RIGHT UPPER QUADRANT COMPARISON:  07/06/2015. FINDINGS: Gallbladder: The previously demonstrated 1.6 cm gallstone in the gallbladder measures 1.7 cm in maximum diameter today. This is mobile. No gallbladder wall thickening or pericholecystic fluid. Common bile duct: Diameter: 5.0 mm Liver: No focal lesion identified. Within normal limits in parenchymal echogenicity. IMPRESSION: Stable cholelithiasis.  No acute abnormality. Electronically Signed   By: Claudie Revering M.D.   On: 10/05/2015 19:10    EKG:   Orders placed or performed during the hospital encounter of 10/05/15  . ED EKG  . ED EKG  . EKG 12-Lead  . EKG 12-Lead    ASSESSMENT AND PLAN:   Charles Cook is a 82 y.o. male with a known history of CAD status post CABG, ischemic cardiomyopathy with ejection fraction of 30%, atrial fibrillation started on eliquis, cholelithiasis, hypertension presents to the hospital secondary to syncopal episode.  #1 syncope-likely vasovagal, also from low blood pressure and weakness. -IV fluids. Hold Lasix. Improving orthostatic blood pressure changes. -Follow up low sodium as well. improving -CT of the head without any acute findings.  #2 Fever- with chills, UA normal, CXR with some bronchitis changes - added levaquin, f/u  blood cultures  Right upper quadrant ultrasound with gall stones, but no cholecystitis findings on [place] or exam.  #4 atrial fibrillation-on amiodarone and digoxin. Normal serum digoxin level. -continue metoprolol -Follows with [facility] cardiology. Consult requested -On eliquis for anticoagulation  #5 CAD-appears stable. Status post CABG. -Recent echocardiogram from March 2017 showing EF of 30%. Has ischemic cardiomyopathy. -Continue cardiac medications.  #6 DVT prophylaxis-already on eliquis  Physical therapy consulted.   All the records are reviewed and case discussed with Care Management/Social Workerr. Management plans discussed with the patient, family and they are in agreement.  CODE STATUS: Full Code  TOTAL TIME TAKING CARE OF THIS PATIENT: 38 minutes.   POSSIBLE D/C IN 1-2 DAYS, DEPENDING ON CLINICAL CONDITION.   Gladstone Lighter M.D on 10/06/2015 at 11:48 AM  Between 7am to 6pm - Pager - [phone]  After 6pm go to [email or web address] - password [email or web address] [facility]  [facility] [place] [facility]  Office  [phone]  CC: Primary care physician; Vernie Murders, PA

## 2015-10-06 NOTE — Care Management (Signed)
Met with patient to discuss PT recommendations. Patient lives at home with his wife. He is independent, active and drives. He picks his wife up from  Dialysis in the evenings. He also works at [employer]. He refused any assistive devices. He has no home health coming in but his wife has someone that come in and helps out with cooking and cleaning. No home health needs identified. Case closed.

## 2015-10-06 NOTE — Progress Notes (Signed)
Pt family upset about the care that their father is receiving. Family expressing interest in transferring pt to [facility]. RN spent total time of 65 minutes updating family and pt on pt's plan of care. Pt and family seemed thankful for the information given.  All questions were answered, and concerns were addressed. No other issues at time. Will continue to monitor.   Charles Cook

## 2015-10-06 NOTE — Progress Notes (Signed)
Patient nauseated, zofran 4mg  iv administer willl continue to monitor

## 2015-10-07 ENCOUNTER — Inpatient Hospital Stay
Admit: 2015-10-07 | Discharge: 2015-10-07 | Disposition: A | Discharge status: Home / Self Care | Payer: Medicare Other | Attending: Internal Medicine | Admitting: Internal Medicine

## 2015-10-07 DIAGNOSIS — Z7901 Long term (current) use of anticoagulants: Secondary | ICD-10-CM | POA: Diagnosis not present

## 2015-10-07 DIAGNOSIS — A419 Sepsis, unspecified organism: Secondary | ICD-10-CM | POA: Diagnosis present

## 2015-10-07 DIAGNOSIS — I4891 Unspecified atrial fibrillation: Secondary | ICD-10-CM | POA: Diagnosis present

## 2015-10-07 DIAGNOSIS — B349 Viral infection, unspecified: Secondary | ICD-10-CM | POA: Diagnosis present

## 2015-10-07 DIAGNOSIS — K8020 Calculus of gallbladder without cholecystitis without obstruction: Secondary | ICD-10-CM | POA: Diagnosis present

## 2015-10-07 DIAGNOSIS — Z951 Presence of aortocoronary bypass graft: Secondary | ICD-10-CM | POA: Diagnosis not present

## 2015-10-07 DIAGNOSIS — N170 Acute kidney failure with tubular necrosis: Secondary | ICD-10-CM | POA: Diagnosis present

## 2015-10-07 DIAGNOSIS — I951 Orthostatic hypotension: Secondary | ICD-10-CM | POA: Diagnosis present

## 2015-10-07 DIAGNOSIS — R634 Abnormal weight loss: Secondary | ICD-10-CM | POA: Diagnosis not present

## 2015-10-07 DIAGNOSIS — I248 Other forms of acute ischemic heart disease: Secondary | ICD-10-CM | POA: Diagnosis present

## 2015-10-07 DIAGNOSIS — R1310 Dysphagia, unspecified: Secondary | ICD-10-CM | POA: Diagnosis present

## 2015-10-07 DIAGNOSIS — Z888 Allergy status to other drugs, medicaments and biological substances status: Secondary | ICD-10-CM | POA: Diagnosis not present

## 2015-10-07 DIAGNOSIS — Z7951 Long term (current) use of inhaled steroids: Secondary | ICD-10-CM | POA: Diagnosis not present

## 2015-10-07 DIAGNOSIS — I255 Ischemic cardiomyopathy: Secondary | ICD-10-CM | POA: Diagnosis present

## 2015-10-07 DIAGNOSIS — R509 Fever, unspecified: Secondary | ICD-10-CM | POA: Diagnosis not present

## 2015-10-07 DIAGNOSIS — Z833 Family history of diabetes mellitus: Secondary | ICD-10-CM | POA: Diagnosis not present

## 2015-10-07 DIAGNOSIS — I252 Old myocardial infarction: Secondary | ICD-10-CM | POA: Diagnosis not present

## 2015-10-07 DIAGNOSIS — Z8546 Personal history of malignant neoplasm of prostate: Secondary | ICD-10-CM | POA: Diagnosis not present

## 2015-10-07 DIAGNOSIS — N400 Enlarged prostate without lower urinary tract symptoms: Secondary | ICD-10-CM | POA: Diagnosis present

## 2015-10-07 DIAGNOSIS — Z809 Family history of malignant neoplasm, unspecified: Secondary | ICD-10-CM | POA: Diagnosis not present

## 2015-10-07 DIAGNOSIS — J984 Other disorders of lung: Secondary | ICD-10-CM | POA: Diagnosis not present

## 2015-10-07 DIAGNOSIS — Z79899 Other long term (current) drug therapy: Secondary | ICD-10-CM | POA: Diagnosis not present

## 2015-10-07 DIAGNOSIS — Z8614 Personal history of Methicillin resistant Staphylococcus aureus infection: Secondary | ICD-10-CM | POA: Diagnosis not present

## 2015-10-07 DIAGNOSIS — N179 Acute kidney failure, unspecified: Secondary | ICD-10-CM | POA: Diagnosis not present

## 2015-10-07 DIAGNOSIS — I110 Hypertensive heart disease with heart failure: Secondary | ICD-10-CM | POA: Diagnosis present

## 2015-10-07 DIAGNOSIS — I509 Heart failure, unspecified: Secondary | ICD-10-CM | POA: Diagnosis present

## 2015-10-07 DIAGNOSIS — E871 Hypo-osmolality and hyponatremia: Secondary | ICD-10-CM | POA: Diagnosis present

## 2015-10-07 DIAGNOSIS — F329 Major depressive disorder, single episode, unspecified: Secondary | ICD-10-CM | POA: Diagnosis present

## 2015-10-07 DIAGNOSIS — R7881 Bacteremia: Secondary | ICD-10-CM | POA: Diagnosis not present

## 2015-10-07 DIAGNOSIS — J40 Bronchitis, not specified as acute or chronic: Secondary | ICD-10-CM | POA: Diagnosis present

## 2015-10-07 DIAGNOSIS — E785 Hyperlipidemia, unspecified: Secondary | ICD-10-CM | POA: Diagnosis present

## 2015-10-07 DIAGNOSIS — Z8249 Family history of ischemic heart disease and other diseases of the circulatory system: Secondary | ICD-10-CM | POA: Diagnosis not present

## 2015-10-07 DIAGNOSIS — Z7982 Long term (current) use of aspirin: Secondary | ICD-10-CM | POA: Diagnosis not present

## 2015-10-07 DIAGNOSIS — I2510 Atherosclerotic heart disease of native coronary artery without angina pectoris: Secondary | ICD-10-CM | POA: Diagnosis present

## 2015-10-07 DIAGNOSIS — A499 Bacterial infection, unspecified: Secondary | ICD-10-CM | POA: Diagnosis not present

## 2015-10-07 LAB — GASTROINTESTINAL PANEL BY PCR, STOOL (REPLACES STOOL CULTURE)

## 2015-10-07 LAB — BASIC METABOLIC PANEL
Anion gap: 7 (ref 5–15)
BUN: 20 mg/dL (ref 6–20)
CO2: 25 mmol/L (ref 22–32)
Calcium: 7.8 mg/dL — ABNORMAL LOW (ref 8.9–10.3)
Chloride: 96 mmol/L — ABNORMAL LOW (ref 101–111)
Creatinine, Ser: 1.59 mg/dL — ABNORMAL HIGH (ref 0.61–1.24)
GFR calc Af Amer: 45 mL/min — ABNORMAL LOW (ref >60)
GFR calc non Af Amer: 38 mL/min — ABNORMAL LOW (ref >60)
Glucose, Bld: 187 mg/dL — ABNORMAL HIGH (ref 65–99)
Potassium: 3.9 mmol/L (ref 3.5–5.1)
Sodium: 128 mmol/L — ABNORMAL LOW (ref 135–145)

## 2015-10-07 LAB — VANCOMYCIN, RANDOM: Vancomycin Rm: 12 ug/mL

## 2015-10-07 LAB — HEPATIC FUNCTION PANEL
ALT: 60 U/L (ref 17–63)
AST: 55 U/L — ABNORMAL HIGH (ref 15–41)
Albumin: 2.7 g/dL — ABNORMAL LOW (ref 3.5–5.0)
Alkaline Phosphatase: 134 U/L — ABNORMAL HIGH (ref 38–126)
Bilirubin, Direct: 1.1 mg/dL — ABNORMAL HIGH (ref 0.1–0.5)
Indirect Bilirubin: 0.7 mg/dL (ref 0.3–0.9)
Total Bilirubin: 1.8 mg/dL — ABNORMAL HIGH (ref 0.3–1.2)
Total Protein: 5.6 g/dL — ABNORMAL LOW (ref 6.5–8.1)

## 2015-10-07 MED ORDER — ENSURE ENLIVE PO LIQD
237.0000 mL | Freq: Two times a day (BID) | ORAL | Status: DC
  Administered 2015-10-09: 237 mL via ORAL

## 2015-10-07 MED ORDER — SODIUM CHLORIDE 0.9 % IV SOLN: INTRAVENOUS | Status: DC

## 2015-10-07 MED ORDER — METOPROLOL TARTRATE 25 MG PO TABS
25.0000 mg | ORAL_TABLET | Freq: Two times a day (BID) | ORAL | Status: DC
Start: 2015-10-07 — End: 2015-10-08
  Administered 2015-10-07: 25 mg via ORAL
  Filled 2015-10-07 (×2): qty 1

## 2015-10-07 MED ORDER — APIXABAN 2.5 MG PO TABS
2.5000 mg | ORAL_TABLET | Freq: Two times a day (BID) | ORAL | Status: DC
  Administered 2015-10-07 – 2015-10-09 (×4): 2.5 mg via ORAL
  Filled 2015-10-07 (×4): qty 1

## 2015-10-07 MED ORDER — SODIUM CHLORIDE 0.9 % IV SOLN
INTRAVENOUS | Status: AC
Start: 2015-10-07 — End: 2015-10-07
  Administered 2015-10-07: 09:00:00 via INTRAVENOUS

## 2015-10-07 NOTE — Progress Notes (Signed)
[place] at [place] NAME: Charles Cook    MR#:  [id number]  DATE OF BIRTH:  [birth year]  SUBJECTIVE:  CHIEF COMPLAINT:   Chief Complaint  Patient presents with  . Near Syncope   - Feels tired today. Had an episode of fever last night with temperature of 100.5  Fahrenheit -Blood pressure low normal today. Blood cultures positive for staph aureus  REVIEW OF SYSTEMS:  Review of Systems  Constitutional: Positive for fever, chills and malaise/fatigue.  HENT: Negative for ear discharge, ear pain and nosebleeds.   Eyes: Negative for blurred vision.  Respiratory: Positive for shortness of breath. Negative for cough and wheezing.   Cardiovascular: Negative for chest pain, palpitations and leg swelling.  Gastrointestinal: Negative for nausea, vomiting, abdominal pain, diarrhea and constipation.  Genitourinary: Negative for dysuria and urgency.  Musculoskeletal: Positive for neck pain. Negative for myalgias.  Neurological: Negative for dizziness, speech change, focal weakness, seizures and headaches.  Psychiatric/Behavioral: Negative for depression.    DRUG ALLERGIES:   Allergies  Allergen Reactions  . Moxifloxacin Hcl In Nacl Other (See Comments)    Reaction:  Confusion     VITALS:  Blood pressure 107/51, pulse 89, temperature 97.8 F (36.6 C), temperature source Oral, resp. rate 19, height 5\' 10"  (1.778 m), weight 81.693 kg (180 lb 1.6 oz), SpO2 95 %.  PHYSICAL EXAMINATION:  Physical Exam  GENERAL: 82 y.o.-year-old patient lying in the bed with no acute distress.  EYES: Pupils equal, round, reactive to light and accommodation. No scleral icterus. Extraocular muscles intact.  HEENT: Head atraumatic, normocephalic. Oropharynx and nasopharynx clear.  NECK: Supple, no jugular venous distention. No thyroid enlargement, no tenderness.  LUNGS: Normal breath sounds bilaterally, no wheezing, rales,rhonchi or crepitation. No use of  accessory muscles of respiration. Still has some right basilar crackles CARDIOVASCULAR: S1, S2 normal. No rubs, or gallops. 2/6 systolic murmur present ABDOMEN: Soft, nontender, nondistended. Bowel sounds present. No organomegaly or mass.  EXTREMITIES: No pedal edema, cyanosis, or clubbing.  NEUROLOGIC: Cranial nerves II through XII are intact. Muscle strength 5/5 in all extremities. Sensation intact. Gait not checked.  PSYCHIATRIC: The patient is alert and oriented x 3.  SKIN: No obvious rash, lesion, or ulcer.    LABORATORY PANEL:   CBC  Recent Labs Lab 10/06/15 0422  WBC 12.2*  HGB 11.3*  HCT 33.1*  PLT 142*   ------------------------------------------------------------------------------------------------------------------  Chemistries   Recent Labs Lab 10/07/15 0315  NA 128*  K 3.9  CL 96*  CO2 25  GLUCOSE 187*  BUN 20  CREATININE 1.59*  CALCIUM 7.8*   ------------------------------------------------------------------------------------------------------------------  Cardiac Enzymes  Recent Labs Lab 10/06/15 0422  TROPONINI <0.03   ------------------------------------------------------------------------------------------------------------------  RADIOLOGY:  Dg Chest 2 View  10/05/2015  CLINICAL DATA:  Productive cough for the past 3 days. Syncope today. EXAM: CHEST  2 VIEW COMPARISON:  06/26/2015. FINDINGS: Stable mildly enlarged cardiac silhouette and post CABG changes. Mild linear density at the left lung base. Old, healed right rib fractures. Mild thoracic spine degenerative changes. Mild diffuse peribronchial thickening without significant change. IMPRESSION: 1. Stable mild bronchitic changes. 2. Stable mild cardiomegaly. 3. Interval small amount of linear atelectasis or scarring at the left lung base. Electronically Signed   By: Claudie Revering M.D.   On: 10/05/2015 16:34   Ct Head Wo Contrast  10/05/2015  CLINICAL DATA:  Syncopal episode EXAM: CT HEAD  WITHOUT CONTRAST TECHNIQUE: Contiguous axial images were obtained from the base of  the skull through the vertex without intravenous contrast. COMPARISON:  None. FINDINGS: Bony calvarium is intact. Mild atrophic changes are noted. Mild chronic white matter ischemic change is seen. No findings to suggest acute hemorrhage, acute infarction or space-occupying mass lesion are noted. IMPRESSION: Chronic atrophic and ischemic changes.  No acute abnormality noted. Electronically Signed   By: Inez Catalina M.D.   On: 10/05/2015 16:51   US Abdomen Limited Ruq  10/05/2015  CLINICAL DATA:  Right upper quadrant abdominal pain and nausea for the past 2 weeks. History of cholelithiasis. EXAM: US ABDOMEN LIMITED - RIGHT UPPER QUADRANT COMPARISON:  07/06/2015. FINDINGS: Gallbladder: The previously demonstrated 1.6 cm gallstone in the gallbladder measures 1.7 cm in maximum diameter today. This is mobile. No gallbladder wall thickening or pericholecystic fluid. Common bile duct: Diameter: 5.0 mm Liver: No focal lesion identified. Within normal limits in parenchymal echogenicity. IMPRESSION: Stable cholelithiasis.  No acute abnormality. Electronically Signed   By: Claudie Revering M.D.   On: 10/05/2015 19:10    EKG:   Orders placed or performed during the hospital encounter of 10/05/15  . ED EKG  . ED EKG  . EKG 12-Lead  . EKG 12-Lead    ASSESSMENT AND PLAN:   Charles Cook is a 82 y.o. male with a known history of CAD status post CABG, ischemic cardiomyopathy with ejection fraction of 30%, atrial fibrillation started on eliquis, cholelithiasis, hypertension presents to the hospital secondary to syncopal episode.  #1 Sepsis- likely was present on admission, elevated wbc, chills and hypotension Now positive blood cultures with GPC in clusters, UA normal -CXR with some bronchitis changes - Right upper quadrant ultrasound with gall stones, but no cholecystitis findings on [place] or exam. - On vancomycin now, doxycycline for the  bronchitis ECHO to r/o vegetations, repeat blood cultures ordered - ID consult, gentle hydration due to sepsis and hypotension- but watch closely with h/o CHF  #2 Hyponatremia and ARF- Male could be hypovolemia. Gentle hydration and restarted today. -If no improvement, will have nephrology consult. -Renal failure noted, started on vancomycin. Check serum levels and adjust the dose appropriately. Hold other nephrotoxins. Likely ATN from hypotension and sepsis. -Continue to monitor closely  #4 atrial fibrillation-on amiodarone and digoxin. Normal serum digoxin level. -continue metoprolol- dose adjusted -Follows with [facility] cardiology. Consult requested -On eliquis for anticoagulation  #5 CAD-appears stable. Status post CABG. -Recent echocardiogram from March 2017 showing EF of 30%. Has ischemic cardiomyopathy. -Continue cardiac medications.  #6 DVT prophylaxis-already on eliquis  Physical therapy consulted.    All the records are reviewed and case discussed with Care Management/Social Workerr. Management plans discussed with the patient, family and they are in agreement.  CODE STATUS: Full Code  TOTAL TIME TAKING CARE OF THIS PATIENT: 38 minutes.   POSSIBLE D/C IN 1-2 DAYS, DEPENDING ON CLINICAL CONDITION.   Gladstone Lighter M.D on 10/07/2015 at 1:34 PM  Between 7am to 6pm - Pager - [phone]  After 6pm go to [email or web address] - password [email or web address] [facility]  [place] [facility]  Office  [phone]  CC: Primary care physician; Vernie Murders, PA

## 2015-10-07 NOTE — Progress Notes (Signed)
Initial Nutrition Assessment  DOCUMENTATION CODES:   Not applicable  INTERVENTION:  -Cater to pt preferences -recommend Ensure Enlive po BID, each supplement provides 350 kcal and 20 grams of protein; pt would like to try Chocolate -Discussed healthy eating plan with pt; pt knows that eating one meal per day was not healhty for him, pt feels week, not happy with rapid wt loss. Pt plans to start eating more frequently, focusing on eating healthy foods, using nutritional supplements if needed  NUTRITION DIAGNOSIS:   Inadequate oral intake related to acute illness as evidenced by per patient/family report.  GOAL:   Patient will meet greater than or equal to 90% of their needs  MONITOR:   PO intake, Supplement acceptance, Labs, Weight trends  REASON FOR ASSESSMENT:   Malnutrition Screening Tool    ASSESSMENT:    82 yo male admitted with sepsis with positive blood cultures, ARF with hyponatremia  Pt reports he has lost 25 pounds since March; pt reports he was told by his MD that he needed to lose "liquid weight" and was started on lasix at that time. Pt also started  cutting back on the amount of po intake, started by cutting out dinner meal and then he cut out his breakfast meal too; thus pt only eating lunch meal. Pt reports he thinks he "went to far" with regards to his diet, feels weak, believes he lost weight too rapidly  Past Medical History  Diagnosis Date  . CAD (coronary artery disease)     s/p CABG in 2006  . Hx MRSA infection   . Depression   . Hyperlipidemia   . Hypertension   . Prostate cancer ([place])   . Atrial fibrillation ([place])     on eliquis  . Cardiomyopathy ([place])   . BPH (benign prostatic hyperplasia)   . Hiatal hernia      Diet Order:  Diet regular Room service appropriate?: Yes; Fluid consistency:: Thin   Energy Intake: pt reports appetite improving; pt ate bites at dinner last night, 100% at breakfast yesterday. Pt reports appetite better today, ate  hamburger steak, potatoes, vegetable and fruit at lunch today  Skin:  Reviewed, no issues  Last BM:  10/07/15 diarrhea   Labs: sodium 128, potassium wdl  Meds: NS at 75 ml.hr, MVI  Height:   Ht Readings from Last 1 Encounters:  10/05/15 5\' 10"  (1.778 m)    Weight:   Wt Readings from Last 1 Encounters:  10/05/15 180 lb 1.6 oz (81.693 kg)    BMI:  Body mass index is 25.84 kg/(m^2).  Estimated Nutritional Needs:   Kcal:  2000-2400 kacls   Protein:  89-113 g  Fluid:  >/= 2 L  EDUCATION NEEDS:   Education needs addressed  Kerman Passey MS, [place], LDN [phone] Pager  [phone] Weekend/On-Call Pager

## 2015-10-07 NOTE — Progress Notes (Signed)
Patient ambulated around nurses station with no difficulties. Used walker. No signs of distress and patient stated he felt good walking.

## 2015-10-07 NOTE — Progress Notes (Signed)
Alert and oriented. Vitals are stable. BP is a little soft. Metoprolol dose cut in half today by Dr. Tressia Miners. No complaints of pain. IV fluids discontinued this evening due to patient's EF history of 30%. Patient currently down getting new ECHO performed. Family has been at bedside periodically throughout the day.

## 2015-10-07 NOTE — Progress Notes (Signed)
Pharmacy Antibiotic Note  Charles Cook is a 82 y.o. male admitted on 10/05/2015 with sepsis.  Pharmacy has been consulted for vancomycin dosing.  BCID with both anaerobic bottles growing staph spp.; spoke with Dr. Tressia Miners.   Plan: Vancomycin 1 g IV x1 then Vancomycin 750 mg  IV every 12 hours.  Goal trough 15-20 mcg/mL.  Will need to monitor renal function carefully.  Trough before 4th dose of regimen, 5th overall dose, for 6/8 at 1530.  ADD: Scr increased from 1.04 to 1.59 definition of AKI. Will need to dose per levels for now. Will order random level for 12 hours post dose. If level within goal range, can redose @  Vancomcin 750 mg IV q12 hours.     Height: 5\' 10"  (177.8 cm) Weight: 180 lb 1.6 oz (81.693 kg) IBW/kg (Calculated) : 73  Temp (24hrs), Avg:99 F (37.2 C), Min:97.6 F (36.4 C), Max:100.5 F (38.1 C)   Recent Labs Lab 10/05/15 1550 10/06/15 0422 10/07/15 0315  WBC 13.1* 12.2*  --   CREATININE 1.21 1.04 1.59*    Estimated Creatinine Clearance: 36.3 mL/min (by C-G formula based on Cr of 1.59).    Allergies  Allergen Reactions  . Moxifloxacin Hcl In Nacl Other (See Comments)    Reaction:  Confusion     Antimicrobials this admission:   Dose adjustments this admission:   Microbiology results: 6/5  BCx: staph spp/pending   Thank you for allowing pharmacy to be a part of this patient's care.  Donisha Hoch D 10/07/2015 11:49 AM
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
## 2015-10-08 ENCOUNTER — Inpatient Hospital Stay: Payer: Medicare Other

## 2015-10-08 LAB — BASIC METABOLIC PANEL
Anion gap: 6 (ref 5–15)
BUN: 29 mg/dL — ABNORMAL HIGH (ref 6–20)
CO2: 25 mmol/L (ref 22–32)
Calcium: 8.2 mg/dL — ABNORMAL LOW (ref 8.9–10.3)
Chloride: 103 mmol/L (ref 101–111)
Creatinine, Ser: 1.9 mg/dL — ABNORMAL HIGH (ref 0.61–1.24)
GFR calc Af Amer: 36 mL/min — ABNORMAL LOW (ref >60)
GFR calc non Af Amer: 31 mL/min — ABNORMAL LOW (ref >60)
Glucose, Bld: 106 mg/dL — ABNORMAL HIGH (ref 65–99)
Potassium: 3.8 mmol/L (ref 3.5–5.1)
Sodium: 134 mmol/L — ABNORMAL LOW (ref 135–145)

## 2015-10-08 LAB — ECHOCARDIOGRAM COMPLETE
Height: 70 in
Weight: 2881.6 oz

## 2015-10-08 NOTE — Progress Notes (Signed)
[place] [facility] [place] NAME: Charles Cook    MR#:  [id number]  DATE OF BIRTH:  02/05/1930  SUBJECTIVE:  CHIEF COMPLAINT:   Chief Complaint  Patient presents with  . Near Syncope   -  no further fevers. Blood pressure is better. Blood cultures still growing gram-positive cocci from 10/05/2015. Repeat blood cultures are negative so far. -Remains on doxycycline at this time. - renal function worsening slowly  REVIEW OF SYSTEMS:  Review of Systems  Constitutional: Negative for fever, chills and malaise/fatigue.  HENT: Negative for ear discharge, ear pain and nosebleeds.   Eyes: Negative for blurred vision.  Respiratory: Positive for shortness of breath. Negative for cough and wheezing.   Cardiovascular: Negative for chest pain, palpitations and leg swelling.  Gastrointestinal: Negative for nausea, vomiting, abdominal pain, diarrhea and constipation.  Genitourinary: Negative for dysuria and urgency.  Musculoskeletal: Negative for myalgias and neck pain.  Neurological: Negative for dizziness, speech change, focal weakness, seizures and headaches.  Psychiatric/Behavioral: Negative for depression.    DRUG ALLERGIES:   Allergies  Allergen Reactions  . Moxifloxacin Hcl In Nacl Other (See Comments)    Reaction:  Confusion     VITALS:  Blood pressure 134/73, pulse 92, temperature 98.2 F (36.8 C), temperature source Oral, resp. rate 18, height 5\' 10"  (1.778 m), weight 81.693 kg (180 lb 1.6 oz), SpO2 96 %.  PHYSICAL EXAMINATION:  Physical Exam  GENERAL: 82 y.o.-year-old patient lying in the bed with no acute distress.  EYES: Pupils equal, round, reactive to light and accommodation. No scleral icterus. Extraocular muscles intact.  HEENT: Head atraumatic, normocephalic. Oropharynx and nasopharynx clear.  NECK: Supple, no jugular venous distention. No thyroid enlargement, no tenderness.  LUNGS: Normal breath sounds bilaterally, no  wheezing, rales,rhonchi or crepitation. No use of accessory muscles of respiration.  fine right basilar crackles CARDIOVASCULAR: S1, S2 normal. No rubs, or gallops. 2/6 systolic murmur present ABDOMEN: Soft, nontender, nondistended. Bowel sounds present. No organomegaly or mass.  EXTREMITIES: No pedal edema, cyanosis, or clubbing.  NEUROLOGIC: Cranial nerves II through XII are intact. Muscle strength 5/5 in all extremities. Sensation intact. Gait not checked.  PSYCHIATRIC: The patient is alert and oriented x 3.  SKIN: No obvious rash, lesion, or ulcer.    LABORATORY PANEL:   CBC  Recent Labs Lab 10/06/15 0422  WBC 12.2*  HGB 11.3*  HCT 33.1*  PLT 142*   ------------------------------------------------------------------------------------------------------------------  Chemistries   Recent Labs Lab 10/07/15 1508 10/08/15 0356  NA  --  134*  K  --  3.8  CL  --  103  CO2  --  25  GLUCOSE  --  106*  BUN  --  29*  CREATININE  --  1.90*  CALCIUM  --  8.2*  AST 55*  --   ALT 60  --   ALKPHOS 134*  --   BILITOT 1.8*  --    ------------------------------------------------------------------------------------------------------------------  Cardiac Enzymes  Recent Labs Lab 10/06/15 0422  TROPONINI <0.03   ------------------------------------------------------------------------------------------------------------------  RADIOLOGY:  No results found.  EKG:   Orders placed or performed during the hospital encounter of 10/05/15  . ED EKG  . ED EKG  . EKG 12-Lead  . EKG 12-Lead    ASSESSMENT AND PLAN:   Charles Cook is a 82 y.o. male with a known history of CAD status post CABG, ischemic cardiomyopathy with ejection fraction of 30%, atrial fibrillation started on eliquis, cholelithiasis, hypertension presents to the hospital  secondary to syncopal episode.  #1 Sepsis- likely was present on admission, elevated wbc, chills and hypotension Now positive blood  cultures with GPC in clusters, UA normal -CXR with some bronchitis changes - Right upper quadrant ultrasound with gall stones, but no cholecystitis findings on [place] or exam. -received vancomycin and now stopped as BCID negative for MRSA, doxycycline for the bronchitis ECHO to r/o vegetations, repeat blood cultures ordered - Appreciate ID consult - ID consult, gentle hydration due to sepsis and hypotension- but watch closely with h/o CHF  #2 Hyponatremia and ARF- received fluids and improved sodium. But renal function worsening - received a dose of vancomycin for positive blood cultures, but troughs were not high -nephrology consult. - Hold other nephrotoxins. Likely ATN from hypotension and sepsis. -Continue to monitor closely  #4 atrial fibrillation-on amiodarone and digoxin. Normal serum digoxin level. -continue metoprolol- dose adjusted -Follows with [facility] cardiology. Consult requested -On eliquis for anticoagulation  #5 CAD-appears stable. Status post CABG. -Recent echocardiogram from March 2017 showing EF of 30%. Has ischemic cardiomyopathy. -Continue cardiac medications. - follow-up repeat echo  #6 DVT prophylaxis-on eliquis  Physical therapy consulted.  Home health therapy at discharge   All the records are reviewed and case discussed with Care Management/Social Workerr. Management plans discussed with the patient, family and they are in agreement.  CODE STATUS: Full Code  TOTAL TIME TAKING CARE OF THIS PATIENT: 38 minutes.   POSSIBLE D/C IN 1 DAYS, DEPENDING ON CLINICAL CONDITION.   Gladstone Lighter M.D on 10/08/2015 at 8:39 AM  Between 7am to 6pm - Pager - [phone]  After 6pm go to [email or web address] - password [email or web address] [facility]  [facility]  Office  [phone]  CC: Primary care physician; Vernie Murders, PA

## 2015-10-08 NOTE — Progress Notes (Signed)
Patient had cardiac monitor simulation preformed for testing from 1330-1400 on 10/08/10. Please disregard any strips from this time period.

## 2015-10-08 NOTE — Progress Notes (Signed)
PT Cancellation Note  Patient Details Name: Charles Cook MRN: [id number] DOB: [birth year]   Cancelled Treatment:    Reason Eval/Treat Not Completed: Patient declined, no reason specified. Chart reviewed. Attempted to see patient however family reports he is on an important phone call right now. Family states that they would like to defer physical therapy to another day. RN notified. Will attempt PT treatment on later date/time as pt is able to participate.  Lyndel Safe Huprich PT, DPT   Huprich,Jason 10/08/2015, 4:49 PM

## 2015-10-08 NOTE — Progress Notes (Signed)
Called Dr. Tressia Miners about patient's BP and giving metoprolol. MD stated to consult Dr. Ubaldo Glassing since this is patient's primary cardiologist. Informed Dr. Ubaldo Glassing of patient's pressure and Dr. Tressia Miners has already decreased metoprolol from 50mg  to 25mg . MD stated to discontinue metoprolol for now.

## 2015-10-08 NOTE — Progress Notes (Signed)
Patient was complaining of some abdominal discomfort this morning from not being able to urinate. Patient states he woke up around 5AM and felt the urge to urinate and was unable to after trying a few times while in bed. Bladder scanned patient and resulted at 700. Stood patient up to [facility] and patient was able to urinate a large amount and stated he felt much better.
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
## 2015-10-08 NOTE — Progress Notes (Signed)
[facility] INFECTIOUS DISEASE PROGRESS NOTE Date of Admission:  10/05/2015     ID: SHAKUR LEMBO is a 82 y.o. male with fever, CNS bacteremia  Active Problems:   Syncope   Sepsis ([place])   Subjective: No further fever,   ROS  Eleven systems are reviewed and negative except per hpi  Medications:  Antibiotics Given (last 72 hours)    Date/Time Action Medication Dose Rate   10/06/15 0831 Given   levofloxacin (LEVAQUIN) IVPB 750 mg 750 mg 100 mL/hr   10/06/15 1603 Given   doxycycline (VIBRA-TABS) tablet 100 mg 100 mg    10/06/15 1603 Given   vancomycin (VANCOCIN) IVPB 1000 mg/200 mL premix 1,000 mg 200 mL/hr   10/06/15 2253 Given   doxycycline (VIBRA-TABS) tablet 100 mg 100 mg    10/07/15 0353 Given   vancomycin (VANCOCIN) IVPB 750 mg/150 ml premix 750 mg 150 mL/hr   10/07/15 0935 Given   doxycycline (VIBRA-TABS) tablet 100 mg 100 mg    10/07/15 2131 Given   doxycycline (VIBRA-TABS) tablet 100 mg 100 mg    10/08/15 1026 Given   doxycycline (VIBRA-TABS) tablet 100 mg 100 mg      . amiodarone  200 mg Oral Daily  . apixaban  2.5 mg Oral BID  . aspirin EC  81 mg Oral QHS  . atorvastatin  80 mg Oral QHS  . digoxin  0.125 mg Oral Daily  . diphenhydrAMINE  25 mg Oral QHS  . doxycycline  100 mg Oral Q12H  . feeding supplement (ENSURE ENLIVE)  237 mL Oral BID WC  . multivitamin with minerals  1 tablet Oral Daily  . niacin  500 mg Oral QHS  . sertraline  25 mg Oral QHS  . sodium chloride flush  3 mL Intravenous Q12H  . vitamin B-12  5,000 mcg Oral Daily    Objective: Vital signs in last 24 hours: Temp:  [97.7 F (36.5 C)-98.2 F (36.8 C)] 98.2 F (36.8 C) (06/08 0532) Pulse Rate:  [84-105] 105 (06/08 1024) Resp:  [18-20] 18 (06/08 0532) BP: (108-139)/(68-82) 108/68 mmHg (06/08 1024) SpO2:  [96 %] 96 % (06/08 0532) Physical Exam  Constitutional: He is oriented to person, place, and time. He appears well-developed and well-nourished. No distress.  HENT:  anicteric Mouth/Throat: Oropharynx is clear and moist. No oropharyngeal exudate.  Cardiovascular: Normal rate, regular rhythm and normal heart sounds.Pulmonary/Chest: Effort normal and breath sounds normal. No respiratory distress. He has no wheezes.  Abdominal: Soft. Bowel sounds are normal. He exhibits no distension. There is no tenderness.  Lymphadenopathy: He has no cervical adenopathy.  Neurological: He is alert and oriented to person, place, and time.  Skin: Skin is warm and dry. No rash noted. No erythema.  Psychiatric: He has a normal mood and affect. His behavior is normal.   Lab Results  Recent Labs  10/05/15 1550 10/06/15 0422 10/07/15 0315 10/08/15 0356  WBC 13.1* 12.2*  --   --   HGB 13.1 11.3*  --   --   HCT 38.0* 33.1*  --   --   NA 128* 129* 128* 134*  K 4.3 3.9 3.9 3.8  CL 96* 99* 96* 103  CO2 21* 25 25 25   BUN 15 14 20  29*  CREATININE 1.21 1.04 1.59* 1.90*    Microbiology: Results for orders placed or performed during the hospital encounter of 10/05/15  CULTURE, BLOOD (ROUTINE X 2) w Reflex to ID Panel     Status: Abnormal (Preliminary result)  Collection Time: 10/05/15  7:12 PM  Result Value Ref Range Status   Specimen Description BLOOD RIGHT HAND  Final   Special Requests BOTTLES DRAWN AEROBIC AND ANAEROBIC  10CC  Final   Culture  Setup Time   Final    GRAM POSITIVE COCCI IN BOTH AEROBIC AND ANAEROBIC BOTTLES CRITICAL RESULT CALLED TO, READ BACK BY AND VERIFIED WITH: KAREN HAYES ON 10/06/15 AT 1458 BY QSD    Culture (A)  Final    STAPHYLOCOCCUS SPECIES (COAGULASE NEGATIVE) SUSCEPTIBILITIES TO FOLLOW Performed at [facility]    Report Status PENDING  Incomplete  CULTURE, BLOOD (ROUTINE X 2) w Reflex to ID Panel     Status: Abnormal (Preliminary result)   Collection Time: 10/05/15  7:12 PM  Result Value Ref Range Status   Specimen Description BLOOD LEFT WRIST  Final   Special Requests BOTTLES DRAWN AEROBIC AND ANAEROBIC 8CC  Final   Culture   Setup Time   Final    GRAM POSITIVE COCCI IN CLUSTERS IN BOTH AEROBIC AND ANAEROBIC BOTTLES CRITICAL VALUE NOTED.  VALUE IS CONSISTENT WITH PREVIOUSLY REPORTED AND CALLED VALUE. Performed at [facility] (COAGULASE NEGATIVE) (A)  Final   Report Status PENDING  Incomplete  Blood Culture ID Panel (Reflexed)     Status: Abnormal   Collection Time: 10/05/15  7:12 PM  Result Value Ref Range Status   Enterococcus species NOT DETECTED NOT DETECTED Final   Vancomycin resistance NOT DETECTED NOT DETECTED Final   Listeria monocytogenes NOT DETECTED NOT DETECTED Final   Staphylococcus species DETECTED (A) NOT DETECTED Final    Comment: CRITICAL RESULT CALLED TO, READ BACK BY AND VERIFIED WITH: KAREN HAYES ON 10/06/15 AT 1458 BY QSD    Staphylococcus aureus NOT DETECTED NOT DETECTED Final   Methicillin resistance NOT DETECTED NOT DETECTED Final   Streptococcus species NOT DETECTED NOT DETECTED Final   Streptococcus agalactiae NOT DETECTED NOT DETECTED Final   Streptococcus pneumoniae NOT DETECTED NOT DETECTED Final   Streptococcus pyogenes NOT DETECTED NOT DETECTED Final   Acinetobacter baumannii NOT DETECTED NOT DETECTED Final   Enterobacteriaceae species NOT DETECTED NOT DETECTED Final   Enterobacter cloacae complex NOT DETECTED NOT DETECTED Final   Escherichia coli NOT DETECTED NOT DETECTED Final   Klebsiella oxytoca NOT DETECTED NOT DETECTED Final   Klebsiella pneumoniae NOT DETECTED NOT DETECTED Final   Proteus species NOT DETECTED NOT DETECTED Final   Serratia marcescens NOT DETECTED NOT DETECTED Final   Carbapenem resistance NOT DETECTED NOT DETECTED Final   Haemophilus influenzae NOT DETECTED NOT DETECTED Final   Neisseria meningitidis NOT DETECTED NOT DETECTED Final   Pseudomonas aeruginosa NOT DETECTED NOT DETECTED Final   Candida albicans NOT DETECTED NOT DETECTED Final   Candida glabrata NOT DETECTED NOT DETECTED Final   Candida krusei NOT  DETECTED NOT DETECTED Final   Candida parapsilosis NOT DETECTED NOT DETECTED Final   Candida tropicalis NOT DETECTED NOT DETECTED Final  Gastrointestinal Panel by PCR , Stool     Status: None   Collection Time: 10/07/15 10:34 AM  Result Value Ref Range Status   Campylobacter species NOT DETECTED NOT DETECTED Final   Plesimonas shigelloides NOT DETECTED NOT DETECTED Final   Salmonella species NOT DETECTED NOT DETECTED Final   Yersinia enterocolitica NOT DETECTED NOT DETECTED Final   Vibrio species NOT DETECTED NOT DETECTED Final   Vibrio cholerae NOT DETECTED NOT DETECTED Final   Enteroaggregative E coli (EAEC) NOT DETECTED NOT  DETECTED Final   Enteropathogenic E coli (EPEC) NOT DETECTED NOT DETECTED Final   Enterotoxigenic E coli (ETEC) NOT DETECTED NOT DETECTED Final   Shiga like toxin producing E coli (STEC) NOT DETECTED NOT DETECTED Final   E. coli O157 NOT DETECTED NOT DETECTED Final   Shigella/Enteroinvasive E coli (EIEC) NOT DETECTED NOT DETECTED Final   Cryptosporidium NOT DETECTED NOT DETECTED Final   Cyclospora cayetanensis NOT DETECTED NOT DETECTED Final   Entamoeba histolytica NOT DETECTED NOT DETECTED Final   Giardia lamblia NOT DETECTED NOT DETECTED Final   Adenovirus F40/41 NOT DETECTED NOT DETECTED Final   Astrovirus NOT DETECTED NOT DETECTED Final   Norovirus GI/GII NOT DETECTED NOT DETECTED Final   Rotavirus A NOT DETECTED NOT DETECTED Final   Sapovirus (I, II, IV, and V) NOT DETECTED NOT DETECTED Final  CULTURE, BLOOD (ROUTINE X 2) w Reflex to ID Panel     Status: None (Preliminary result)   Collection Time: 10/07/15  1:06 PM  Result Value Ref Range Status   Specimen Description BLOOD RIGHT AC  Final   Special Requests   Final    BOTTLES DRAWN AEROBIC AND ANAEROBIC AER 4ML ANA 2ML   Culture NO GROWTH < 24 HOURS  Final   Report Status PENDING  Incomplete  CULTURE, BLOOD (ROUTINE X 2) w Reflex to ID Panel     Status: None (Preliminary result)   Collection Time:  10/07/15  1:06 PM  Result Value Ref Range Status   Specimen Description BLOOD RIGHT HAND  Final   Special Requests   Final    BOTTLES DRAWN AEROBIC AND ANAEROBIC AER 1ML ANA .5ML   Culture NO GROWTH < 24 HOURS  Final   Report Status PENDING  Incomplete    Studies/Results: Dg Chest 2 View  10/08/2015  CLINICAL DATA:  Post syncope.  Elevated white blood cell count. EXAM: CHEST  2 VIEW COMPARISON:  10/05/2015 FINDINGS: Mild cardiac enlargement. Previous median sternotomy and CABG procedure. No pleural effusion or edema. Scarring is noted within the left lateral lung base. Again noted are fracture deformities involving the lateral aspect of the right sixth and seventh ribs. IMPRESSION: 1. No acute findings. 2. Left base scarring 3. Right rib fractures. Electronically Signed   By: Kerby Moors M.D.   On: 10/08/2015 08:57    Assessment/Plan: QUINLAN VOLLMER is a 82 y.o. male with a GI illness, RUQ abd pain, slightly elevated wbc and low grade fevers. He also has 1/2 BCX + staph species pending ID I suspect the [facility] with CNS  is contaminant. He does not have a ppm or other intravascular device.  Likely has viral gastro or other viral illness as cause of his illness. His only other complaint is some mild cough with eating, cxr did show some bronchitic changes and scarring at lung bases. ? If could be aspiration risk  LFTs with mild increase alk phos, mild increase ast and T bili Today had some difficulty starting his urine stream.   Recommendations Wu renal issues given increasing cr and difficulty with urine stream.  Continue doxy for 5 days total for the possible bronchitic changes on cxr and mild cough  Thank you very much for the consult. Will follow with you.  [place], Nocholas Damaso P   10/08/2015, 12:27 PM

## 2015-10-09 ENCOUNTER — Other Ambulatory Visit: Payer: Self-pay | Admitting: *Deleted

## 2015-10-09 LAB — BASIC METABOLIC PANEL
Anion gap: 7 (ref 5–15)
BUN: 31 mg/dL — ABNORMAL HIGH (ref 6–20)
CO2: 27 mmol/L (ref 22–32)
Calcium: 8.2 mg/dL — ABNORMAL LOW (ref 8.9–10.3)
Chloride: 102 mmol/L (ref 101–111)
Creatinine, Ser: 1.85 mg/dL — ABNORMAL HIGH (ref 0.61–1.24)
GFR calc Af Amer: 37 mL/min — ABNORMAL LOW (ref >60)
GFR calc non Af Amer: 32 mL/min — ABNORMAL LOW (ref >60)
Glucose, Bld: 112 mg/dL — ABNORMAL HIGH (ref 65–99)
Potassium: 3.8 mmol/L (ref 3.5–5.1)
Sodium: 136 mmol/L (ref 135–145)

## 2015-10-09 LAB — CULTURE, BLOOD (ROUTINE X 2)

## 2015-10-09 LAB — CBC
HCT: 33.9 % — ABNORMAL LOW (ref 40.0–52.0)
Hemoglobin: 11.7 g/dL — ABNORMAL LOW (ref 13.0–18.0)
MCH: 32.9 pg (ref 26.0–34.0)
MCHC: 34.5 g/dL (ref 32.0–36.0)
MCV: 95.4 fL (ref 80.0–100.0)
Platelets: 199 10*3/uL (ref 150–440)
RBC: 3.55 MIL/uL — ABNORMAL LOW (ref 4.40–5.90)
RDW: 16 % — ABNORMAL HIGH (ref 11.5–14.5)
WBC: 10.2 10*3/uL (ref 3.8–10.6)

## 2015-10-09 MED ORDER — METOPROLOL TARTRATE 25 MG PO TABS
25.0000 mg | ORAL_TABLET | Freq: Three times a day (TID) | ORAL | Status: DC
  Administered 2015-10-09: 25 mg via ORAL
  Filled 2015-10-09: qty 1

## 2015-10-09 MED ORDER — DOXYCYCLINE HYCLATE 100 MG PO TABS: 100.0000 mg | ORAL_TABLET | Freq: Two times a day (BID) | ORAL | Status: DC

## 2015-10-09 MED ORDER — APIXABAN 2.5 MG PO TABS: 2.5000 mg | ORAL_TABLET | Freq: Two times a day (BID) | ORAL | Status: AC

## 2015-10-09 NOTE — Progress Notes (Addendum)
Patient ambulated around unit with walker and aide, approx. 8 times. Tolerated well, no complaints of shortness of breath, dizziness or lightheadedness. HR remained stable in 100s while ambulating. MD aware of successful ambulation.

## 2015-10-09 NOTE — Discharge Summary (Signed)
[facility] [place] NAME: Charles Cook    MR#:  [id number]  DATE OF BIRTH:  08-01-1929  DATE OF ADMISSION:  10/05/2015 ADMITTING PHYSICIAN: Gladstone Lighter, MD  DATE OF DISCHARGE: 10/09/2015  3:33 PM  PRIMARY CARE PHYSICIAN: Vernie Murders, PA    ADMISSION DIAGNOSIS:  Hyponatremia [E87.1] Gall stones [K80.20] Elevated troponin [R79.89] Syncope, unspecified syncope type [R55]  DISCHARGE DIAGNOSIS:  Active Problems:   Syncope   Sepsis ([place])   SECONDARY DIAGNOSIS:   Past Medical History  Diagnosis Date  . CAD (coronary artery disease)     s/p CABG in 2006  . Hx MRSA infection   . Depression   . Hyperlipidemia   . Hypertension   . Prostate cancer ([place])   . Atrial fibrillation ([place])     on eliquis  . Cardiomyopathy ([place])   . BPH (benign prostatic hyperplasia)   . Hiatal hernia     HOSPITAL COURSE:   Charles Cook is a 82 y.o. male with a known history of CAD status post CABG, ischemic cardiomyopathy with ejection fraction of 30%, atrial fibrillation started on eliquis, cholelithiasis, hypertension presents to the hospital secondary to syncopal episode.  #1 Sepsis- likely was present on admission, elevated wbc, chills and hypotension - secondary to bronchitis Now positive blood cultures with GPC in clusters, UA normal -CXR with some bronchitis changes - Right upper quadrant ultrasound with gall stones, but no cholecystitis findings on [place] or exam. -received vancomycin and now stopped as BCID negative for MRSA and per ID- could be contaminant - doxycycline for the bronchitis ECHO with no vegetations, negative blood cultures ordered - Appreciate ID consult - ID consult, gentle hydration due to sepsis and hypotension- but watch closely with h/o CHF  #2 Hyponatremia and ARF- received fluids and improved sodium.  ARF- secondary to ATN from hypotension and sepsis on admission -nephrology consult appreciated. -Improved and  stable now, outpatient BMP in 1 week - renal US with normal kidneys  #4 atrial fibrillation-on amiodarone and digoxin. Normal serum digoxin level. -continue metoprolol- dose adjusted back to home dose as BP much better now -Follows with [facility] cardiology. Appreciate Dr. Bethanne Ginger input -On eliquis for anticoagulation  #5 CAD-appears stable. Status post CABG. -Recent echocardiogram from March 2017 showing EF of 30%. Has ischemic cardiomyopathy. -Continue cardiac medications. Repeat ECHO with improved EF to 50%  #6 Mucus stool - should resolve in the next few days- if not GI f/u -and occasional dysphagia- over years- improving -f/u PCP for a barium swallow if recurs  Patient very stable today, feeling very well Will be discharged home with home health  DISCHARGE CONDITIONS:   stable  CONSULTS OBTAINED:  Treatment Team:  Isaias Cowman, MD Leonel Ramsay, MD Murlean Iba, MD  DRUG ALLERGIES:   Allergies  Allergen Reactions  . Moxifloxacin Hcl In Nacl Other (See Comments)    Reaction:  Confusion     DISCHARGE MEDICATIONS:   Discharge Medication List as of 10/09/2015 12:32 PM    START taking these medications   Details  doxycycline (VIBRA-TABS) 100 MG tablet Take 1 tablet (100 mg total) by mouth every 12 (twelve) hours. X 5 more days, Starting 10/09/2015, Until Discontinued, Normal      CONTINUE these medications which have CHANGED   Details  apixaban (ELIQUIS) 2.5 MG TABS tablet Take 1 tablet (2.5 mg total) by mouth 2 (two) times daily., Starting 10/09/2015, Until Discontinued, Normal      CONTINUE these  medications which have NOT CHANGED   Details  amiodarone (PACERONE) 200 MG tablet Take 200 mg by mouth daily. , Until Discontinued, Historical Med    aspirin EC 81 MG tablet Take 81 mg by mouth at bedtime., Until Discontinued, Historical Med    atorvastatin (LIPITOR) 80 MG tablet Take 80 mg by mouth at bedtime., Until Discontinued, Historical Med    Cyanocobalamin  (VITAMIN B-12) 5000 MCG SUBL Place 5,000 mcg under the tongue daily., Until Discontinued, Historical Med    digoxin (DIGOX) 0.25 MG tablet Take 0.125 mg by mouth daily. , Until Discontinued, Historical Med    diphenhydrAMINE (BENADRYL) 25 MG tablet Take 25 mg by mouth at bedtime. , Until Discontinued, Historical Med    fluticasone (FLONASE) 50 MCG/ACT nasal spray Place 2 sprays into the nose daily as needed for rhinitis. , Until Discontinued, Historical Med    metoprolol succinate (TOPROL-XL) 50 MG 24 hr tablet Take 50 mg by mouth 2 (two) times daily. Take with or immediately following a meal., Until Discontinued, Historical Med    Multiple Vitamin (MULTIVITAMIN WITH MINERALS) TABS tablet Take 1 tablet by mouth daily., Until Discontinued, Historical Med    niacin (NIASPAN) 500 MG CR tablet Take 500 mg by mouth at bedtime., Until Discontinued, Historical Med    ondansetron (ZOFRAN) 4 MG tablet Take 1 tablet (4 mg total) by mouth every 8 (eight) hours as needed for nausea or vomiting., Starting 10/05/2015, Until Discontinued, Normal    sertraline (ZOLOFT) 25 MG tablet Take 25 mg by mouth at bedtime. , Until Discontinued, Historical Med      STOP taking these medications     furosemide (LASIX) 20 MG tablet          DISCHARGE INSTRUCTIONS:   1. PCP f/u in 1-2 weeks 2. Nephrology f/u in 1 week and BMP check 3. Cardiology f/u in 2 weeks  If you experience worsening of your admission symptoms, develop shortness of breath, life threatening emergency, suicidal or homicidal thoughts you must seek medical attention immediately by calling 911 or calling your MD immediately  if symptoms less severe.  You Must read complete instructions/literature along with all the possible adverse reactions/side effects for all the Medicines you take and that have been prescribed to you. Take any new Medicines after you have completely understood and accept all the possible adverse reactions/side effects.    Please note  You were cared for by a hospitalist during your hospital stay. If you have any questions about your discharge medications or the care you received while you were in the hospital after you are discharged, you can call the unit and asked to speak with the hospitalist on call if the hospitalist that took care of you is not available. Once you are discharged, your primary care physician will handle any further medical issues. Please note that NO REFILLS for any discharge medications will be authorized once you are discharged, as it is imperative that you return to your primary care physician (or establish a relationship with a primary care physician if you do not have one) for your aftercare needs so that they can reassess your need for medications and monitor your lab values.    Today   CHIEF COMPLAINT:   Chief Complaint  Patient presents with  . Near Syncope    VITAL SIGNS:  Blood pressure 128/49, pulse 72, temperature 98.1 F (36.7 C), temperature source Oral, resp. rate 18, height 5\' 10"  (1.778 m), weight 81.693 kg (180 lb 1.6 oz),  SpO2 95 %.  I/O:   Intake/Output Summary (Last 24 hours) at 10/09/15 1546 Last data filed at 10/09/15 [id number]  Gross per 24 hour  Intake    480 ml  Output      0 ml  Net    480 ml    PHYSICAL EXAMINATION:   Physical Exam  GENERAL: 82 y.o.-year-old patient lying in the bed with no acute distress.  EYES: Pupils equal, round, reactive to light and accommodation. No scleral icterus. Extraocular muscles intact.  HEENT: Head atraumatic, normocephalic. Oropharynx and nasopharynx clear.  NECK: Supple, no jugular venous distention. No thyroid enlargement, no tenderness.  LUNGS: Normal breath sounds bilaterally, no wheezing, rales,rhonchi or crepitation. No use of accessory muscles of respiration. fine right basilar crackles CARDIOVASCULAR: S1, S2 normal. No rubs, or gallops. 2/6 systolic murmur present ABDOMEN: Soft, nontender,  nondistended. Bowel sounds present. No organomegaly or mass.  EXTREMITIES: No pedal edema, cyanosis, or clubbing.  NEUROLOGIC: Cranial nerves II through XII are intact. Muscle strength 5/5 in all extremities. Sensation intact. Gait not checked.  PSYCHIATRIC: The patient is alert and oriented x 3.  SKIN: No obvious rash, lesion, or ulcer.   DATA REVIEW:   CBC  Recent Labs Lab 10/09/15 0445  WBC 10.2  HGB 11.7*  HCT 33.9*  PLT 199    Chemistries   Recent Labs Lab 10/07/15 1508  10/09/15 0445  NA  --   < > 136  K  --   < > 3.8  CL  --   < > 102  CO2  --   < > 27  GLUCOSE  --   < > 112*  BUN  --   < > 31*  CREATININE  --   < > 1.85*  CALCIUM  --   < > 8.2*  AST 55*  --   --   ALT 60  --   --   ALKPHOS 134*  --   --   BILITOT 1.8*  --   --   < > = values in this interval not displayed.  Cardiac Enzymes  Recent Labs Lab 10/06/15 0422  TROPONINI <0.03    Microbiology Results  Results for orders placed or performed during the hospital encounter of 10/05/15  CULTURE, BLOOD (ROUTINE X 2) w Reflex to ID Panel     Status: Abnormal   Collection Time: 10/05/15  7:12 PM  Result Value Ref Range Status   Specimen Description BLOOD RIGHT HAND  Final   Special Requests BOTTLES DRAWN AEROBIC AND ANAEROBIC  10CC  Final   Culture  Setup Time   Final    GRAM POSITIVE COCCI IN BOTH AEROBIC AND ANAEROBIC BOTTLES CRITICAL RESULT CALLED TO, READ BACK BY AND VERIFIED WITH: KAREN HAYES ON 10/06/15 AT 1458 BY QSD    Culture STAPHYLOCOCCUS SPECIES (COAGULASE NEGATIVE) (A)  Final   Report Status 10/09/2015 FINAL  Final   Organism ID, Bacteria STAPHYLOCOCCUS SPECIES (COAGULASE NEGATIVE)  Final      Susceptibility   Staphylococcus species (coagulase negative) - MIC*    CIPROFLOXACIN <=0.5 SENSITIVE Sensitive     ERYTHROMYCIN >=8 RESISTANT Resistant     GENTAMICIN <=0.5 SENSITIVE Sensitive     OXACILLIN Value in next row Sensitive      <=0.25 SENSITIVEWARNING: For  oxacillin-resistant S.aureus and coagulase-negative staphylococci (MRS), other beta-lactam agents, ie, penicillins, beta-lactam/beta-lactamase inhibitor combinations, cephems (with the exception of the cephalosporins with anti-MRSA activity), and carbapenems, may appear active in vitro, but are not effective  clinically.  --CLSI, Vol.32 No.3, January 2012, pg 70.    TETRACYCLINE Value in next row Resistant      <=0.25 SENSITIVEWARNING: For oxacillin-resistant S.aureus and coagulase-negative staphylococci (MRS), other beta-lactam agents, ie, penicillins, beta-lactam/beta-lactamase inhibitor combinations, cephems (with the exception of the cephalosporins with anti-MRSA activity), and carbapenems, may appear active in vitro, but are not effective clinically.  --CLSI, Vol.32 No.3, January 2012, pg 70.    VANCOMYCIN Value in next row Sensitive      <=0.25 SENSITIVEWARNING: For oxacillin-resistant S.aureus and coagulase-negative staphylococci (MRS), other beta-lactam agents, ie, penicillins, beta-lactam/beta-lactamase inhibitor combinations, cephems (with the exception of the cephalosporins with anti-MRSA activity), and carbapenems, may appear active in vitro, but are not effective clinically.  --CLSI, Vol.32 No.3, January 2012, pg 70.    TRIMETH/SULFA Value in next row Sensitive      <=0.25 SENSITIVEWARNING: For oxacillin-resistant S.aureus and coagulase-negative staphylococci (MRS), other beta-lactam agents, ie, penicillins, beta-lactam/beta-lactamase inhibitor combinations, cephems (with the exception of the cephalosporins with anti-MRSA activity), and carbapenems, may appear active in vitro, but are not effective clinically.  --CLSI, Vol.32 No.3, January 2012, pg 70.    CLINDAMYCIN Value in next row Resistant      <=0.25 SENSITIVEWARNING: For oxacillin-resistant S.aureus and coagulase-negative staphylococci (MRS), other beta-lactam agents, ie, penicillins, beta-lactam/beta-lactamase inhibitor combinations,  cephems (with the exception of the cephalosporins with anti-MRSA activity), and carbapenems, may appear active in vitro, but are not effective clinically.  --CLSI, Vol.32 No.3, January 2012, pg 70.    RIFAMPIN Value in next row Sensitive      <=0.25 SENSITIVEWARNING: For oxacillin-resistant S.aureus and coagulase-negative staphylococci (MRS), other beta-lactam agents, ie, penicillins, beta-lactam/beta-lactamase inhibitor combinations, cephems (with the exception of the cephalosporins with anti-MRSA activity), and carbapenems, may appear active in vitro, but are not effective clinically.  --CLSI, Vol.32 No.3, January 2012, pg 70.    * STAPHYLOCOCCUS SPECIES (COAGULASE NEGATIVE)  CULTURE, BLOOD (ROUTINE X 2) w Reflex to ID Panel     Status: Abnormal   Collection Time: 10/05/15  7:12 PM  Result Value Ref Range Status   Specimen Description BLOOD LEFT WRIST  Final   Special Requests BOTTLES DRAWN AEROBIC AND ANAEROBIC 8CC  Final   Culture  Setup Time   Final    GRAM POSITIVE COCCI IN CLUSTERS IN BOTH AEROBIC AND ANAEROBIC BOTTLES CRITICAL VALUE NOTED.  VALUE IS CONSISTENT WITH PREVIOUSLY REPORTED AND CALLED VALUE.    Culture (A)  Final    STAPHYLOCOCCUS SPECIES (COAGULASE NEGATIVE) SUSCEPTIBILITIES PERFORMED ON PREVIOUS CULTURE WITHIN THE LAST 5 DAYS. Performed at [facility]    Report Status 10/09/2015 FINAL  Final  Blood Culture ID Panel (Reflexed)     Status: Abnormal   Collection Time: 10/05/15  7:12 PM  Result Value Ref Range Status   Enterococcus species NOT DETECTED NOT DETECTED Final   Vancomycin resistance NOT DETECTED NOT DETECTED Final   Listeria monocytogenes NOT DETECTED NOT DETECTED Final   Staphylococcus species DETECTED (A) NOT DETECTED Final    Comment: CRITICAL RESULT CALLED TO, READ BACK BY AND VERIFIED WITH: KAREN HAYES ON 10/06/15 AT 1458 BY QSD    Staphylococcus aureus NOT DETECTED NOT DETECTED Final   Methicillin resistance NOT DETECTED NOT DETECTED Final    Streptococcus species NOT DETECTED NOT DETECTED Final   Streptococcus agalactiae NOT DETECTED NOT DETECTED Final   Streptococcus pneumoniae NOT DETECTED NOT DETECTED Final   Streptococcus pyogenes NOT DETECTED NOT DETECTED Final   Acinetobacter baumannii NOT DETECTED NOT DETECTED Final  Enterobacteriaceae species NOT DETECTED NOT DETECTED Final   Enterobacter cloacae complex NOT DETECTED NOT DETECTED Final   Escherichia coli NOT DETECTED NOT DETECTED Final   Klebsiella oxytoca NOT DETECTED NOT DETECTED Final   Klebsiella pneumoniae NOT DETECTED NOT DETECTED Final   Proteus species NOT DETECTED NOT DETECTED Final   Serratia marcescens NOT DETECTED NOT DETECTED Final   Carbapenem resistance NOT DETECTED NOT DETECTED Final   Haemophilus influenzae NOT DETECTED NOT DETECTED Final   Neisseria meningitidis NOT DETECTED NOT DETECTED Final   Pseudomonas aeruginosa NOT DETECTED NOT DETECTED Final   Candida albicans NOT DETECTED NOT DETECTED Final   Candida glabrata NOT DETECTED NOT DETECTED Final   Candida krusei NOT DETECTED NOT DETECTED Final   Candida parapsilosis NOT DETECTED NOT DETECTED Final   Candida tropicalis NOT DETECTED NOT DETECTED Final  Gastrointestinal Panel by PCR , Stool     Status: None   Collection Time: 10/07/15 10:34 AM  Result Value Ref Range Status   Campylobacter species NOT DETECTED NOT DETECTED Final   Plesimonas shigelloides NOT DETECTED NOT DETECTED Final   Salmonella species NOT DETECTED NOT DETECTED Final   Yersinia enterocolitica NOT DETECTED NOT DETECTED Final   Vibrio species NOT DETECTED NOT DETECTED Final   Vibrio cholerae NOT DETECTED NOT DETECTED Final   Enteroaggregative E coli (EAEC) NOT DETECTED NOT DETECTED Final   Enteropathogenic E coli (EPEC) NOT DETECTED NOT DETECTED Final   Enterotoxigenic E coli (ETEC) NOT DETECTED NOT DETECTED Final   Shiga like toxin producing E coli (STEC) NOT DETECTED NOT DETECTED Final   E. coli O157 NOT DETECTED NOT  DETECTED Final   Shigella/Enteroinvasive E coli (EIEC) NOT DETECTED NOT DETECTED Final   Cryptosporidium NOT DETECTED NOT DETECTED Final   Cyclospora cayetanensis NOT DETECTED NOT DETECTED Final   Entamoeba histolytica NOT DETECTED NOT DETECTED Final   Giardia lamblia NOT DETECTED NOT DETECTED Final   Adenovirus F40/41 NOT DETECTED NOT DETECTED Final   Astrovirus NOT DETECTED NOT DETECTED Final   Norovirus GI/GII NOT DETECTED NOT DETECTED Final   Rotavirus A NOT DETECTED NOT DETECTED Final   Sapovirus (I, II, IV, and V) NOT DETECTED NOT DETECTED Final  CULTURE, BLOOD (ROUTINE X 2) w Reflex to ID Panel     Status: None (Preliminary result)   Collection Time: 10/07/15  1:06 PM  Result Value Ref Range Status   Specimen Description BLOOD RIGHT AC  Final   Special Requests   Final    BOTTLES DRAWN AEROBIC AND ANAEROBIC AER 4ML ANA 2ML   Culture NO GROWTH 2 DAYS  Final   Report Status PENDING  Incomplete  CULTURE, BLOOD (ROUTINE X 2) w Reflex to ID Panel     Status: None (Preliminary result)   Collection Time: 10/07/15  1:06 PM  Result Value Ref Range Status   Specimen Description BLOOD RIGHT HAND  Final   Special Requests   Final    BOTTLES DRAWN AEROBIC AND ANAEROBIC AER 1ML ANA .5ML   Culture NO GROWTH 2 DAYS  Final   Report Status PENDING  Incomplete    RADIOLOGY:  Dg Chest 2 View  10/08/2015  CLINICAL DATA:  Post syncope.  Elevated white blood cell count. EXAM: CHEST  2 VIEW COMPARISON:  10/05/2015 FINDINGS: Mild cardiac enlargement. Previous median sternotomy and CABG procedure. No pleural effusion or edema. Scarring is noted within the left lateral lung base. Again noted are fracture deformities involving the lateral aspect of the right sixth and seventh ribs.  IMPRESSION: 1. No acute findings. 2. Left base scarring 3. Right rib fractures. Electronically Signed   By: Kerby Moors M.D.   On: 10/08/2015 08:57   US Abdomen Complete  10/08/2015  CLINICAL DATA:  Increased bilirubin and  acute renal failure EXAM: ABDOMEN ULTRASOUND COMPLETE COMPARISON:  10/05/2015 FINDINGS: Gallbladder: Cholelithiasis is again identified. No wall thickening or pericholecystic fluid is noted. There is at least 1 stone identified in the gallbladder neck/cystic duct. Common bile duct: Diameter: 2.3 mm. Liver: No focal lesion identified. Within normal limits in parenchymal echogenicity. IVC: No abnormality visualized. Pancreas: Not well visualized due to overlying bowel gas. Spleen: Size and appearance within normal limits. Right Kidney: Length: 12.4 cm. Echogenicity within normal limits. No mass or hydronephrosis visualized. Left Kidney: Length: 11.4 cm. Echogenicity within normal limits. No mass or hydronephrosis visualized. Abdominal aorta: No aneurysm visualized. Other findings: None. IMPRESSION: Cholelithiasis with one in the area of the gallbladder neck/cystic duct. No wall thickening or pericholecystic fluid is noted. No other focal abnormality is seen. Electronically Signed   By: Inez Catalina M.D.   On: 10/08/2015 16:01   US Pelvis Limited  10/08/2015  : Limited ultrasound of the pelvis/bladder No comparison History: Acute renal failure. Difficulty urinating. Suprapubic pain. TECHNIQUE: Grayscale and color Doppler images were obtained of the bladder. FINDINGS: The bladder appears to be within normal limits with ureteral jets identified. Conclusion: The bladder is normal in appearance. Electronically Signed   By: Dorise Bullion III M.D   On: 10/08/2015 16:02    EKG:   Orders placed or performed during the hospital encounter of 10/05/15  . ED EKG  . ED EKG  . EKG 12-Lead  . EKG 12-Lead      Management plans discussed with the patient, family and they are in agreement.  CODE STATUS:     Code Status Orders        Start     Ordered   10/05/15 2157  Full code   Continuous     10/05/15 2157    Code Status History    Date Active Date Inactive Code Status Order ID Comments User Context    This patient has a current code status but no historical code status.      TOTAL TIME TAKING CARE OF THIS PATIENT: 37 minutes.    Gladstone Lighter M.D on 10/09/2015 at 3:46 PM  Between 7am to 6pm - Pager - [phone]  After 6pm go to [email or web address] - password [email or web address] [facility]  [facility]  Office  [phone]  CC: Primary care physician; Vernie Murders, PA

## 2015-10-09 NOTE — Consult Note (Signed)
   [facility] CM Inpatient Consult   10/09/2015  Charles Cook 1929/09/19 [id number]   Chart review revealed patient eligible for [place] Management services with a diagnosis of Atrial Fib. for post hospital discharge follow up. Patient was evaluated for community based chronic disease management services with [facility] care Management Program as a benefit of patient's Traditional Medicare. Met with the patient and [name] at the bedside to explain [place] [facility] services. Patient endorses his primary care provider to be [employer], [place]. Patient states he plans to accept home health services through [facility]. Consent form signed. Patient states the best number to reach him is [phone] and gives written permission to speak with [name], who handles his medical affairs at [phone]. Daughter explained the patient's spouse is ill and the family tries to assist as much as possible by accepting calls for her father. Patient will receive post hospital discharge calls and be evaluated for monthly home visits. [facility] Care Management services does not interfere with or replace any services arranged by the inpatient care management team. RNCM left contact information and [facility] literature at the bedside. Made inpatient RNCM aware that [facility] will be following for care management. For additional questions please contact:   Annais Crafts RN, [facility] Liaison  [phone]) Business Mobile [phone]) Toll free office

## 2015-10-09 NOTE — Progress Notes (Signed)
Subjective:   Doing better today Asking about going home   Objective:  Vital signs in last 24 hours:  Temp:  [97.8 F (36.6 C)-98.1 F (36.7 C)] 98.1 F (36.7 C) (06/09 1116) Pulse Rate:  [72-130] 72 (06/09 1116) Resp:  [16-18] 18 (06/09 1116) BP: (128-166)/(49-85) 128/49 mmHg (06/09 1116) SpO2:  [94 %-98 %] 95 % (06/09 1116)  Weight change:  Filed Weights   10/05/15 1535 10/05/15 2215  Weight: 81.647 kg (180 lb) 81.693 kg (180 lb 1.6 oz)    Intake/Output:    Intake/Output Summary (Last 24 hours) at 10/09/15 1500 Last data filed at 10/09/15 [id number]  Gross per 24 hour  Intake    480 ml  Output      0 ml  Net    480 ml     Physical Exam: General: NAD< laying in bed  HEENT Anicteric, moist oral mucus membranes  Neck supple  Pulm/lungs Clear to auscultation bilaterally  CVS/Heart irregular, no rub or gallop  Abdomen:  Soft, nontender  Extremities: No peripheral edema  Neurologic: Alert, oriented  Skin: No acute rashes          Basic Metabolic Panel:   Recent Labs Lab 10/05/15 1550 10/06/15 0422 10/07/15 0315 10/08/15 0356 10/09/15 0445  NA 128* 129* 128* 134* 136  K 4.3 3.9 3.9 3.8 3.8  CL 96* 99* 96* 103 102  CO2 21* 25 25 25 27   GLUCOSE 141* 145* 187* 106* 112*  BUN 15 14 20  29* 31*  CREATININE 1.21 1.04 1.59* 1.90* 1.85*  CALCIUM 8.1* 7.7* 7.8* 8.2* 8.2*     CBC:  Recent Labs Lab 10/05/15 1550 10/06/15 0422 10/09/15 0445  WBC 13.1* 12.2* 10.2  HGB 13.1 11.3* 11.7*  HCT 38.0* 33.1* 33.9*  MCV 93.9 93.5 95.4  PLT 165 142* 199      Microbiology:  Recent Results (from the past 720 hour(s))  CULTURE, BLOOD (ROUTINE X 2) w Reflex to ID Panel     Status: Abnormal   Collection Time: 10/05/15  7:12 PM  Result Value Ref Range Status   Specimen Description BLOOD RIGHT HAND  Final   Special Requests BOTTLES DRAWN AEROBIC AND ANAEROBIC  10CC  Final   Culture  Setup Time   Final    GRAM POSITIVE COCCI IN BOTH AEROBIC AND ANAEROBIC  BOTTLES CRITICAL RESULT CALLED TO, READ BACK BY AND VERIFIED WITH: KAREN HAYES ON 10/06/15 AT 1458 BY QSD    Culture STAPHYLOCOCCUS SPECIES (COAGULASE NEGATIVE) (A)  Final   Report Status 10/09/2015 FINAL  Final   Organism ID, Bacteria STAPHYLOCOCCUS SPECIES (COAGULASE NEGATIVE)  Final      Susceptibility   Staphylococcus species (coagulase negative) - MIC*    CIPROFLOXACIN <=0.5 SENSITIVE Sensitive     ERYTHROMYCIN >=8 RESISTANT Resistant     GENTAMICIN <=0.5 SENSITIVE Sensitive     OXACILLIN Value in next row Sensitive      <=0.25 SENSITIVEWARNING: For oxacillin-resistant S.aureus and coagulase-negative staphylococci (MRS), other beta-lactam agents, ie, penicillins, beta-lactam/beta-lactamase inhibitor combinations, cephems (with the exception of the cephalosporins with anti-MRSA activity), and carbapenems, may appear active in vitro, but are not effective clinically.  --CLSI, Vol.32 No.3, January 2012, pg 70.    TETRACYCLINE Value in next row Resistant      <=0.25 SENSITIVEWARNING: For oxacillin-resistant S.aureus and coagulase-negative staphylococci (MRS), other beta-lactam agents, ie, penicillins, beta-lactam/beta-lactamase inhibitor combinations, cephems (with the exception of the cephalosporins with anti-MRSA activity), and carbapenems, may appear active in vitro, but are not  effective clinically.  --CLSI, Vol.32 No.3, January 2012, pg 70.    VANCOMYCIN Value in next row Sensitive      <=0.25 SENSITIVEWARNING: For oxacillin-resistant S.aureus and coagulase-negative staphylococci (MRS), other beta-lactam agents, ie, penicillins, beta-lactam/beta-lactamase inhibitor combinations, cephems (with the exception of the cephalosporins with anti-MRSA activity), and carbapenems, may appear active in vitro, but are not effective clinically.  --CLSI, Vol.32 No.3, January 2012, pg 70.    TRIMETH/SULFA Value in next row Sensitive      <=0.25 SENSITIVEWARNING: For oxacillin-resistant S.aureus and  coagulase-negative staphylococci (MRS), other beta-lactam agents, ie, penicillins, beta-lactam/beta-lactamase inhibitor combinations, cephems (with the exception of the cephalosporins with anti-MRSA activity), and carbapenems, may appear active in vitro, but are not effective clinically.  --CLSI, Vol.32 No.3, January 2012, pg 70.    CLINDAMYCIN Value in next row Resistant      <=0.25 SENSITIVEWARNING: For oxacillin-resistant S.aureus and coagulase-negative staphylococci (MRS), other beta-lactam agents, ie, penicillins, beta-lactam/beta-lactamase inhibitor combinations, cephems (with the exception of the cephalosporins with anti-MRSA activity), and carbapenems, may appear active in vitro, but are not effective clinically.  --CLSI, Vol.32 No.3, January 2012, pg 70.    RIFAMPIN Value in next row Sensitive      <=0.25 SENSITIVEWARNING: For oxacillin-resistant S.aureus and coagulase-negative staphylococci (MRS), other beta-lactam agents, ie, penicillins, beta-lactam/beta-lactamase inhibitor combinations, cephems (with the exception of the cephalosporins with anti-MRSA activity), and carbapenems, may appear active in vitro, but are not effective clinically.  --CLSI, Vol.32 No.3, January 2012, pg 70.    * STAPHYLOCOCCUS SPECIES (COAGULASE NEGATIVE)  CULTURE, BLOOD (ROUTINE X 2) w Reflex to ID Panel     Status: Abnormal   Collection Time: 10/05/15  7:12 PM  Result Value Ref Range Status   Specimen Description BLOOD LEFT WRIST  Final   Special Requests BOTTLES DRAWN AEROBIC AND ANAEROBIC 8CC  Final   Culture  Setup Time   Final    GRAM POSITIVE COCCI IN CLUSTERS IN BOTH AEROBIC AND ANAEROBIC BOTTLES CRITICAL VALUE NOTED.  VALUE IS CONSISTENT WITH PREVIOUSLY REPORTED AND CALLED VALUE.    Culture (A)  Final    STAPHYLOCOCCUS SPECIES (COAGULASE NEGATIVE) SUSCEPTIBILITIES PERFORMED ON PREVIOUS CULTURE WITHIN THE LAST 5 DAYS. Performed at [facility]    Report Status 10/09/2015 FINAL  Final  Blood  Culture ID Panel (Reflexed)     Status: Abnormal   Collection Time: 10/05/15  7:12 PM  Result Value Ref Range Status   Enterococcus species NOT DETECTED NOT DETECTED Final   Vancomycin resistance NOT DETECTED NOT DETECTED Final   Listeria monocytogenes NOT DETECTED NOT DETECTED Final   Staphylococcus species DETECTED (A) NOT DETECTED Final    Comment: CRITICAL RESULT CALLED TO, READ BACK BY AND VERIFIED WITH: KAREN HAYES ON 10/06/15 AT 1458 BY QSD    Staphylococcus aureus NOT DETECTED NOT DETECTED Final   Methicillin resistance NOT DETECTED NOT DETECTED Final   Streptococcus species NOT DETECTED NOT DETECTED Final   Streptococcus agalactiae NOT DETECTED NOT DETECTED Final   Streptococcus pneumoniae NOT DETECTED NOT DETECTED Final   Streptococcus pyogenes NOT DETECTED NOT DETECTED Final   Acinetobacter baumannii NOT DETECTED NOT DETECTED Final   Enterobacteriaceae species NOT DETECTED NOT DETECTED Final   Enterobacter cloacae complex NOT DETECTED NOT DETECTED Final   Escherichia coli NOT DETECTED NOT DETECTED Final   Klebsiella oxytoca NOT DETECTED NOT DETECTED Final   Klebsiella pneumoniae NOT DETECTED NOT DETECTED Final   Proteus species NOT DETECTED NOT DETECTED Final   Serratia marcescens NOT DETECTED NOT  DETECTED Final   Carbapenem resistance NOT DETECTED NOT DETECTED Final   Haemophilus influenzae NOT DETECTED NOT DETECTED Final   Neisseria meningitidis NOT DETECTED NOT DETECTED Final   Pseudomonas aeruginosa NOT DETECTED NOT DETECTED Final   Candida albicans NOT DETECTED NOT DETECTED Final   Candida glabrata NOT DETECTED NOT DETECTED Final   Candida krusei NOT DETECTED NOT DETECTED Final   Candida parapsilosis NOT DETECTED NOT DETECTED Final   Candida tropicalis NOT DETECTED NOT DETECTED Final  Gastrointestinal Panel by PCR , Stool     Status: None   Collection Time: 10/07/15 10:34 AM  Result Value Ref Range Status   Campylobacter species NOT DETECTED NOT DETECTED Final    Plesimonas shigelloides NOT DETECTED NOT DETECTED Final   Salmonella species NOT DETECTED NOT DETECTED Final   Yersinia enterocolitica NOT DETECTED NOT DETECTED Final   Vibrio species NOT DETECTED NOT DETECTED Final   Vibrio cholerae NOT DETECTED NOT DETECTED Final   Enteroaggregative E coli (EAEC) NOT DETECTED NOT DETECTED Final   Enteropathogenic E coli (EPEC) NOT DETECTED NOT DETECTED Final   Enterotoxigenic E coli (ETEC) NOT DETECTED NOT DETECTED Final   Shiga like toxin producing E coli (STEC) NOT DETECTED NOT DETECTED Final   E. coli O157 NOT DETECTED NOT DETECTED Final   Shigella/Enteroinvasive E coli (EIEC) NOT DETECTED NOT DETECTED Final   Cryptosporidium NOT DETECTED NOT DETECTED Final   Cyclospora cayetanensis NOT DETECTED NOT DETECTED Final   Entamoeba histolytica NOT DETECTED NOT DETECTED Final   Giardia lamblia NOT DETECTED NOT DETECTED Final   Adenovirus F40/41 NOT DETECTED NOT DETECTED Final   Astrovirus NOT DETECTED NOT DETECTED Final   Norovirus GI/GII NOT DETECTED NOT DETECTED Final   Rotavirus A NOT DETECTED NOT DETECTED Final   Sapovirus (I, II, IV, and V) NOT DETECTED NOT DETECTED Final  CULTURE, BLOOD (ROUTINE X 2) w Reflex to ID Panel     Status: None (Preliminary result)   Collection Time: 10/07/15  1:06 PM  Result Value Ref Range Status   Specimen Description BLOOD RIGHT AC  Final   Special Requests   Final    BOTTLES DRAWN AEROBIC AND ANAEROBIC AER 4ML ANA 2ML   Culture NO GROWTH 2 DAYS  Final   Report Status PENDING  Incomplete  CULTURE, BLOOD (ROUTINE X 2) w Reflex to ID Panel     Status: None (Preliminary result)   Collection Time: 10/07/15  1:06 PM  Result Value Ref Range Status   Specimen Description BLOOD RIGHT HAND  Final   Special Requests   Final    BOTTLES DRAWN AEROBIC AND ANAEROBIC AER 1ML ANA .5ML   Culture NO GROWTH 2 DAYS  Final   Report Status PENDING  Incomplete    Coagulation Studies: No results for input(s): LABPROT, INR in the last  72 hours.  Urinalysis: No results for input(s): COLORURINE, LABSPEC, PHURINE, GLUCOSEU, HGBUR, BILIRUBINUR, KETONESUR, PROTEINUR, UROBILINOGEN, NITRITE, LEUKOCYTESUR in the last 72 hours.  Invalid input(s): APPERANCEUR    Imaging: Dg Chest 2 View  10/08/2015  CLINICAL DATA:  Post syncope.  Elevated white blood cell count. EXAM: CHEST  2 VIEW COMPARISON:  10/05/2015 FINDINGS: Mild cardiac enlargement. Previous median sternotomy and CABG procedure. No pleural effusion or edema. Scarring is noted within the left lateral lung base. Again noted are fracture deformities involving the lateral aspect of the right sixth and seventh ribs. IMPRESSION: 1. No acute findings. 2. Left base scarring 3. Right rib fractures. Electronically Signed   By:  Kerby Moors M.D.   On: 10/08/2015 08:57   US Abdomen Complete  10/08/2015  CLINICAL DATA:  Increased bilirubin and acute renal failure EXAM: ABDOMEN ULTRASOUND COMPLETE COMPARISON:  10/05/2015 FINDINGS: Gallbladder: Cholelithiasis is again identified. No wall thickening or pericholecystic fluid is noted. There is at least 1 stone identified in the gallbladder neck/cystic duct. Common bile duct: Diameter: 2.3 mm. Liver: No focal lesion identified. Within normal limits in parenchymal echogenicity. IVC: No abnormality visualized. Pancreas: Not well visualized due to overlying bowel gas. Spleen: Size and appearance within normal limits. Right Kidney: Length: 12.4 cm. Echogenicity within normal limits. No mass or hydronephrosis visualized. Left Kidney: Length: 11.4 cm. Echogenicity within normal limits. No mass or hydronephrosis visualized. Abdominal aorta: No aneurysm visualized. Other findings: None. IMPRESSION: Cholelithiasis with one in the area of the gallbladder neck/cystic duct. No wall thickening or pericholecystic fluid is noted. No other focal abnormality is seen. Electronically Signed   By: Inez Catalina M.D.   On: 10/08/2015 16:01   US Pelvis Limited  10/08/2015   : Limited ultrasound of the pelvis/bladder No comparison History: Acute renal failure. Difficulty urinating. Suprapubic pain. TECHNIQUE: Grayscale and color Doppler images were obtained of the bladder. FINDINGS: The bladder appears to be within normal limits with ureteral jets identified. Conclusion: The bladder is normal in appearance. Electronically Signed   By: Dorise Bullion III M.D   On: 10/08/2015 16:02     Medications:     . amiodarone  200 mg Oral Daily  . apixaban  2.5 mg Oral BID  . aspirin EC  81 mg Oral QHS  . atorvastatin  80 mg Oral QHS  . digoxin  0.125 mg Oral Daily  . diphenhydrAMINE  25 mg Oral QHS  . doxycycline  100 mg Oral Q12H  . feeding supplement (ENSURE ENLIVE)  237 mL Oral BID WC  . metoprolol tartrate  25 mg Oral TID  . multivitamin with minerals  1 tablet Oral Daily  . niacin  500 mg Oral QHS  . sertraline  25 mg Oral QHS  . sodium chloride flush  3 mL Intravenous Q12H  . vitamin B-12  5,000 mcg Oral Daily   acetaminophen **OR** acetaminophen, fluticasone, ondansetron **OR** ondansetron (ZOFRAN) IV  Assessment/ Plan:  82 y.o. male with medical problems of coronary disease, CABG in 2006,congestive heart failure with EF 30%, atrial fibrillation, depression, hyperlipidemia, hypertension, prostate cancer, atrial fibrillation, cardiomyopathy, BPH, hiatal hernia, who was admitted to [facility] on 10/05/2015 for evaluation of syncope.  1. Acute renal failure   likely ATN from hypotension at the time of syncope BP and hemodynamics have stabilized  S Cr has started to improve  2. H/o BPH - will obtain renal imaging  3. Low albumin, weight loss - abdominal u/s unremarkable Agree with holding furosemide.  May need prn   LOS: 2 Meygan Kyser 6/9/20173:00 PM

## 2015-10-09 NOTE — Care Management Important Message (Signed)
Important Message  Patient Details  Name: Charles Cook MRN: [id number] Date of Birth: Jul 15, 1929   Medicare Important Message Given:  Yes    Katrina Stack, RN 10/09/2015, 9:03 AM

## 2015-10-09 NOTE — Progress Notes (Signed)
Pt. Discharged to home with hh pt via wc. Discharge instructions and medication regimen reviewed at bedside with patient and daughter. Both verbalize understanding of instructions and medication regimen. Prescriptions sent to pharmacy, family aware Patient assessment unchanged from this morning. TELE and IV discontinued per policy.

## 2015-10-09 NOTE — Care Management (Signed)
Met with patient and his daughter this morning to discuss home health.  Patient is very ambivalent regarding this services.  Says he would like an order "like my wife has."  Patient's wife receives in home caregiver assistance through [facility].  Discussed the differences of skilled home health nursing and physical therapy and in home continuous care.  he finally accepts home health and chose [facility].  Referral called to [facility] and accepted

## 2015-10-09 NOTE — Progress Notes (Signed)
[place] INFECTIOUS DISEASE PROGRESS NOTE Date of Admission:  10/05/2015     ID: TAIVON HAROON is a 82 y.o. male with fever, CNS bacteremia  Active Problems:   Syncope   Sepsis ([place])   Subjective: No further fever, oob, still with some mucus type stools btu no blood, no abd pain. Eating and drinking ok  ROS  Eleven systems are reviewed and negative except per hpi  Medications:  Antibiotics Given (last 72 hours)    Date/Time Action Medication Dose Rate   10/06/15 1603 Given   doxycycline (VIBRA-TABS) tablet 100 mg 100 mg    10/06/15 1603 Given   vancomycin (VANCOCIN) IVPB 1000 mg/200 mL premix 1,000 mg 200 mL/hr   10/06/15 2253 Given   doxycycline (VIBRA-TABS) tablet 100 mg 100 mg    10/07/15 0353 Given   vancomycin (VANCOCIN) IVPB 750 mg/150 ml premix 750 mg 150 mL/hr   10/07/15 0935 Given   doxycycline (VIBRA-TABS) tablet 100 mg 100 mg    10/07/15 2131 Given   doxycycline (VIBRA-TABS) tablet 100 mg 100 mg    10/08/15 1026 Given   doxycycline (VIBRA-TABS) tablet 100 mg 100 mg    10/08/15 2114 Given   doxycycline (VIBRA-TABS) tablet 100 mg 100 mg    10/09/15 1003 Given   doxycycline (VIBRA-TABS) tablet 100 mg 100 mg      . amiodarone  200 mg Oral Daily  . apixaban  2.5 mg Oral BID  . aspirin EC  81 mg Oral QHS  . atorvastatin  80 mg Oral QHS  . digoxin  0.125 mg Oral Daily  . diphenhydrAMINE  25 mg Oral QHS  . doxycycline  100 mg Oral Q12H  . feeding supplement (ENSURE ENLIVE)  237 mL Oral BID WC  . metoprolol tartrate  25 mg Oral TID  . multivitamin with minerals  1 tablet Oral Daily  . niacin  500 mg Oral QHS  . sertraline  25 mg Oral QHS  . sodium chloride flush  3 mL Intravenous Q12H  . vitamin B-12  5,000 mcg Oral Daily    Objective: Vital signs in last 24 hours: Temp:  [97.8 F (36.6 C)-98.1 F (36.7 C)] 98.1 F (36.7 C) (06/09 1116) Pulse Rate:  [72-130] 72 (06/09 1116) Resp:  [16-18] 18 (06/09 1116) BP: (128-166)/(49-85) 128/49 mmHg (06/09  1116) SpO2:  [94 %-98 %] 95 % (06/09 1116) Physical Exam  Constitutional: He is oriented to person, place, and time. He appears well-developed and well-nourished. No distress.  HENT: anicteric Mouth/Throat: Oropharynx is clear and moist. No oropharyngeal exudate.  Cardiovascular: Normal rate, regular rhythm and normal heart sounds.Pulmonary/Chest: Effort normal and breath sounds normal. No respiratory distress. He has no wheezes.  Abdominal: Soft. Bowel sounds are normal. He exhibits no distension. There is no tenderness.  Lymphadenopathy: He has no cervical adenopathy.  Neurological: He is alert and oriented to person, place, and time.  Skin: Skin is warm and dry. No rash noted. No erythema.  Psychiatric: He has a normal mood and affect. His behavior is normal.   Lab Results  Recent Labs  10/08/15 0356 10/09/15 0445  WBC  --  10.2  HGB  --  11.7*  HCT  --  33.9*  NA 134* 136  K 3.8 3.8  CL 103 102  CO2 25 27  BUN 29* 31*  CREATININE 1.90* 1.85*    Microbiology: Results for orders placed or performed during the hospital encounter of 10/05/15  CULTURE, BLOOD (ROUTINE X 2) w  Reflex to ID Panel     Status: Abnormal   Collection Time: 10/05/15  7:12 PM  Result Value Ref Range Status   Specimen Description BLOOD RIGHT HAND  Final   Special Requests BOTTLES DRAWN AEROBIC AND ANAEROBIC  10CC  Final   Culture  Setup Time   Final    GRAM POSITIVE COCCI IN BOTH AEROBIC AND ANAEROBIC BOTTLES CRITICAL RESULT CALLED TO, READ BACK BY AND VERIFIED WITH: KAREN HAYES ON 10/06/15 AT 60 BY QSD    Culture STAPHYLOCOCCUS SPECIES (COAGULASE NEGATIVE) (A)  Final   Report Status 10/09/2015 FINAL  Final   Organism ID, Bacteria STAPHYLOCOCCUS SPECIES (COAGULASE NEGATIVE)  Final      Susceptibility   Staphylococcus species (coagulase negative) - MIC*    CIPROFLOXACIN <=0.5 SENSITIVE Sensitive     ERYTHROMYCIN >=8 RESISTANT Resistant     GENTAMICIN <=0.5 SENSITIVE Sensitive     OXACILLIN Value  in next row Sensitive      <=0.25 SENSITIVEWARNING: For oxacillin-resistant S.aureus and coagulase-negative staphylococci (MRS), other beta-lactam agents, ie, penicillins, beta-lactam/beta-lactamase inhibitor combinations, cephems (with the exception of the cephalosporins with anti-MRSA activity), and carbapenems, may appear active in vitro, but are not effective clinically.  --CLSI, Vol.32 No.3, January 2012, pg 70.    TETRACYCLINE Value in next row Resistant      <=0.25 SENSITIVEWARNING: For oxacillin-resistant S.aureus and coagulase-negative staphylococci (MRS), other beta-lactam agents, ie, penicillins, beta-lactam/beta-lactamase inhibitor combinations, cephems (with the exception of the cephalosporins with anti-MRSA activity), and carbapenems, may appear active in vitro, but are not effective clinically.  --CLSI, Vol.32 No.3, January 2012, pg 70.    VANCOMYCIN Value in next row Sensitive      <=0.25 SENSITIVEWARNING: For oxacillin-resistant S.aureus and coagulase-negative staphylococci (MRS), other beta-lactam agents, ie, penicillins, beta-lactam/beta-lactamase inhibitor combinations, cephems (with the exception of the cephalosporins with anti-MRSA activity), and carbapenems, may appear active in vitro, but are not effective clinically.  --CLSI, Vol.32 No.3, January 2012, pg 70.    TRIMETH/SULFA Value in next row Sensitive      <=0.25 SENSITIVEWARNING: For oxacillin-resistant S.aureus and coagulase-negative staphylococci (MRS), other beta-lactam agents, ie, penicillins, beta-lactam/beta-lactamase inhibitor combinations, cephems (with the exception of the cephalosporins with anti-MRSA activity), and carbapenems, may appear active in vitro, but are not effective clinically.  --CLSI, Vol.32 No.3, January 2012, pg 70.    CLINDAMYCIN Value in next row Resistant      <=0.25 SENSITIVEWARNING: For oxacillin-resistant S.aureus and coagulase-negative staphylococci (MRS), other beta-lactam agents, ie,  penicillins, beta-lactam/beta-lactamase inhibitor combinations, cephems (with the exception of the cephalosporins with anti-MRSA activity), and carbapenems, may appear active in vitro, but are not effective clinically.  --CLSI, Vol.32 No.3, January 2012, pg 70.    RIFAMPIN Value in next row Sensitive      <=0.25 SENSITIVEWARNING: For oxacillin-resistant S.aureus and coagulase-negative staphylococci (MRS), other beta-lactam agents, ie, penicillins, beta-lactam/beta-lactamase inhibitor combinations, cephems (with the exception of the cephalosporins with anti-MRSA activity), and carbapenems, may appear active in vitro, but are not effective clinically.  --CLSI, Vol.32 No.3, January 2012, pg 70.    * STAPHYLOCOCCUS SPECIES (COAGULASE NEGATIVE)  CULTURE, BLOOD (ROUTINE X 2) w Reflex to ID Panel     Status: Abnormal   Collection Time: 10/05/15  7:12 PM  Result Value Ref Range Status   Specimen Description BLOOD LEFT WRIST  Final   Special Requests BOTTLES DRAWN AEROBIC AND ANAEROBIC 8CC  Final   Culture  Setup Time   Final    GRAM POSITIVE COCCI IN CLUSTERS IN BOTH  AEROBIC AND ANAEROBIC BOTTLES CRITICAL VALUE NOTED.  VALUE IS CONSISTENT WITH PREVIOUSLY REPORTED AND CALLED VALUE.    Culture (A)  Final    STAPHYLOCOCCUS SPECIES (COAGULASE NEGATIVE) SUSCEPTIBILITIES PERFORMED ON PREVIOUS CULTURE WITHIN THE LAST 5 DAYS. Performed at [facility]    Report Status 10/09/2015 FINAL  Final  Blood Culture ID Panel (Reflexed)     Status: Abnormal   Collection Time: 10/05/15  7:12 PM  Result Value Ref Range Status   Enterococcus species NOT DETECTED NOT DETECTED Final   Vancomycin resistance NOT DETECTED NOT DETECTED Final   Listeria monocytogenes NOT DETECTED NOT DETECTED Final   Staphylococcus species DETECTED (A) NOT DETECTED Final    Comment: CRITICAL RESULT CALLED TO, READ BACK BY AND VERIFIED WITH: KAREN HAYES ON 10/06/15 AT 1458 BY QSD    Staphylococcus aureus NOT DETECTED NOT DETECTED Final    Methicillin resistance NOT DETECTED NOT DETECTED Final   Streptococcus species NOT DETECTED NOT DETECTED Final   Streptococcus agalactiae NOT DETECTED NOT DETECTED Final   Streptococcus pneumoniae NOT DETECTED NOT DETECTED Final   Streptococcus pyogenes NOT DETECTED NOT DETECTED Final   Acinetobacter baumannii NOT DETECTED NOT DETECTED Final   Enterobacteriaceae species NOT DETECTED NOT DETECTED Final   Enterobacter cloacae complex NOT DETECTED NOT DETECTED Final   Escherichia coli NOT DETECTED NOT DETECTED Final   Klebsiella oxytoca NOT DETECTED NOT DETECTED Final   Klebsiella pneumoniae NOT DETECTED NOT DETECTED Final   Proteus species NOT DETECTED NOT DETECTED Final   Serratia marcescens NOT DETECTED NOT DETECTED Final   Carbapenem resistance NOT DETECTED NOT DETECTED Final   Haemophilus influenzae NOT DETECTED NOT DETECTED Final   Neisseria meningitidis NOT DETECTED NOT DETECTED Final   Pseudomonas aeruginosa NOT DETECTED NOT DETECTED Final   Candida albicans NOT DETECTED NOT DETECTED Final   Candida glabrata NOT DETECTED NOT DETECTED Final   Candida krusei NOT DETECTED NOT DETECTED Final   Candida parapsilosis NOT DETECTED NOT DETECTED Final   Candida tropicalis NOT DETECTED NOT DETECTED Final  Gastrointestinal Panel by PCR , Stool     Status: None   Collection Time: 10/07/15 10:34 AM  Result Value Ref Range Status   Campylobacter species NOT DETECTED NOT DETECTED Final   Plesimonas shigelloides NOT DETECTED NOT DETECTED Final   Salmonella species NOT DETECTED NOT DETECTED Final   Yersinia enterocolitica NOT DETECTED NOT DETECTED Final   Vibrio species NOT DETECTED NOT DETECTED Final   Vibrio cholerae NOT DETECTED NOT DETECTED Final   Enteroaggregative E coli (EAEC) NOT DETECTED NOT DETECTED Final   Enteropathogenic E coli (EPEC) NOT DETECTED NOT DETECTED Final   Enterotoxigenic E coli (ETEC) NOT DETECTED NOT DETECTED Final   Shiga like toxin producing E coli (STEC) NOT  DETECTED NOT DETECTED Final   E. coli O157 NOT DETECTED NOT DETECTED Final   Shigella/Enteroinvasive E coli (EIEC) NOT DETECTED NOT DETECTED Final   Cryptosporidium NOT DETECTED NOT DETECTED Final   Cyclospora cayetanensis NOT DETECTED NOT DETECTED Final   Entamoeba histolytica NOT DETECTED NOT DETECTED Final   Giardia lamblia NOT DETECTED NOT DETECTED Final   Adenovirus F40/41 NOT DETECTED NOT DETECTED Final   Astrovirus NOT DETECTED NOT DETECTED Final   Norovirus GI/GII NOT DETECTED NOT DETECTED Final   Rotavirus A NOT DETECTED NOT DETECTED Final   Sapovirus (I, II, IV, and V) NOT DETECTED NOT DETECTED Final  CULTURE, BLOOD (ROUTINE X 2) w Reflex to ID Panel     Status: None (Preliminary result)  Collection Time: 10/07/15  1:06 PM  Result Value Ref Range Status   Specimen Description BLOOD RIGHT AC  Final   Special Requests   Final    BOTTLES DRAWN AEROBIC AND ANAEROBIC AER 4ML ANA 2ML   Culture NO GROWTH 2 DAYS  Final   Report Status PENDING  Incomplete  CULTURE, BLOOD (ROUTINE X 2) w Reflex to ID Panel     Status: None (Preliminary result)   Collection Time: 10/07/15  1:06 PM  Result Value Ref Range Status   Specimen Description BLOOD RIGHT HAND  Final   Special Requests   Final    BOTTLES DRAWN AEROBIC AND ANAEROBIC AER 1ML ANA .5ML   Culture NO GROWTH 2 DAYS  Final   Report Status PENDING  Incomplete    Studies/Results: Dg Chest 2 View  10/08/2015  CLINICAL DATA:  Post syncope.  Elevated white blood cell count. EXAM: CHEST  2 VIEW COMPARISON:  10/05/2015 FINDINGS: Mild cardiac enlargement. Previous median sternotomy and CABG procedure. No pleural effusion or edema. Scarring is noted within the left lateral lung base. Again noted are fracture deformities involving the lateral aspect of the right sixth and seventh ribs. IMPRESSION: 1. No acute findings. 2. Left base scarring 3. Right rib fractures. Electronically Signed   By: Kerby Moors M.D.   On: 10/08/2015 08:57   US  Abdomen Complete  10/08/2015  CLINICAL DATA:  Increased bilirubin and acute renal failure EXAM: ABDOMEN ULTRASOUND COMPLETE COMPARISON:  10/05/2015 FINDINGS: Gallbladder: Cholelithiasis is again identified. No wall thickening or pericholecystic fluid is noted. There is at least 1 stone identified in the gallbladder neck/cystic duct. Common bile duct: Diameter: 2.3 mm. Liver: No focal lesion identified. Within normal limits in parenchymal echogenicity. IVC: No abnormality visualized. Pancreas: Not well visualized due to overlying bowel gas. Spleen: Size and appearance within normal limits. Right Kidney: Length: 12.4 cm. Echogenicity within normal limits. No mass or hydronephrosis visualized. Left Kidney: Length: 11.4 cm. Echogenicity within normal limits. No mass or hydronephrosis visualized. Abdominal aorta: No aneurysm visualized. Other findings: None. IMPRESSION: Cholelithiasis with one in the area of the gallbladder neck/cystic duct. No wall thickening or pericholecystic fluid is noted. No other focal abnormality is seen. Electronically Signed   By: Inez Catalina M.D.   On: 10/08/2015 16:01   US Pelvis Limited  10/08/2015  : Limited ultrasound of the pelvis/bladder No comparison History: Acute renal failure. Difficulty urinating. Suprapubic pain. TECHNIQUE: Grayscale and color Doppler images were obtained of the bladder. FINDINGS: The bladder appears to be within normal limits with ureteral jets identified. Conclusion: The bladder is normal in appearance. Electronically Signed   By: Dorise Bullion III M.D   On: 10/08/2015 16:02    Assessment/Plan: Mindi Slicker Groseclose is a 82 y.o. male with a GI illness, RUQ abd pain, slightly elevated wbc and low grade fevers. He also has 1/2 BCX + CNS I suspect the [facility] with CNS  is contaminant. He does not have a ppm or other intravascular device.  Likely has viral gastro or other viral illness as cause of his illness. His only other complaint is some mild cough with eating,  cxr did show some bronchitic changes and scarring at lung bases. ? If could be aspiration risk  LFTs with mild increase alk phos, mild increase ast and T bili - RUQ USS with some stones btu this is known  Today had some difficulty starting his urine stream. Renal USS neg, cr improving.   Recommendations Continue doxy  for 5 days total for the possible bronchitic changes on cxr and mild cough  I suspect his stool will return to normal in next 1-2 weeks- if not should see GI in fu  Thank you very much for the consult. Will follow with you.  Anielle Headrick P   10/09/2015, 1:57 PM

## 2015-10-10 DIAGNOSIS — I10 Essential (primary) hypertension: Secondary | ICD-10-CM | POA: Diagnosis not present

## 2015-10-10 DIAGNOSIS — I4891 Unspecified atrial fibrillation: Secondary | ICD-10-CM | POA: Diagnosis not present

## 2015-10-10 DIAGNOSIS — Z7901 Long term (current) use of anticoagulants: Secondary | ICD-10-CM | POA: Diagnosis not present

## 2015-10-10 DIAGNOSIS — A419 Sepsis, unspecified organism: Secondary | ICD-10-CM | POA: Diagnosis not present

## 2015-10-10 DIAGNOSIS — I255 Ischemic cardiomyopathy: Secondary | ICD-10-CM | POA: Diagnosis not present

## 2015-10-10 DIAGNOSIS — I2510 Atherosclerotic heart disease of native coronary artery without angina pectoris: Secondary | ICD-10-CM | POA: Diagnosis not present

## 2015-10-10 DIAGNOSIS — Z951 Presence of aortocoronary bypass graft: Secondary | ICD-10-CM | POA: Diagnosis not present

## 2015-10-10 DIAGNOSIS — K8020 Calculus of gallbladder without cholecystitis without obstruction: Secondary | ICD-10-CM | POA: Diagnosis not present

## 2015-10-10 DIAGNOSIS — Z8546 Personal history of malignant neoplasm of prostate: Secondary | ICD-10-CM | POA: Diagnosis not present

## 2015-10-10 DIAGNOSIS — Z8614 Personal history of Methicillin resistant Staphylococcus aureus infection: Secondary | ICD-10-CM | POA: Diagnosis not present

## 2015-10-10 DIAGNOSIS — N400 Enlarged prostate without lower urinary tract symptoms: Secondary | ICD-10-CM | POA: Diagnosis not present

## 2015-10-10 DIAGNOSIS — F329 Major depressive disorder, single episode, unspecified: Secondary | ICD-10-CM | POA: Diagnosis not present

## 2015-10-12 ENCOUNTER — Telehealth: Payer: Self-pay | Admitting: Family Medicine

## 2015-10-12 ENCOUNTER — Other Ambulatory Visit: Payer: Self-pay | Admitting: *Deleted

## 2015-10-12 ENCOUNTER — Encounter: Payer: Self-pay | Admitting: *Deleted

## 2015-10-12 DIAGNOSIS — K8020 Calculus of gallbladder without cholecystitis without obstruction: Secondary | ICD-10-CM | POA: Diagnosis not present

## 2015-10-12 DIAGNOSIS — I4891 Unspecified atrial fibrillation: Secondary | ICD-10-CM | POA: Diagnosis not present

## 2015-10-12 DIAGNOSIS — I255 Ischemic cardiomyopathy: Secondary | ICD-10-CM | POA: Diagnosis not present

## 2015-10-12 DIAGNOSIS — N179 Acute kidney failure, unspecified: Secondary | ICD-10-CM | POA: Diagnosis not present

## 2015-10-12 DIAGNOSIS — A419 Sepsis, unspecified organism: Secondary | ICD-10-CM | POA: Diagnosis not present

## 2015-10-12 DIAGNOSIS — I10 Essential (primary) hypertension: Secondary | ICD-10-CM | POA: Diagnosis not present

## 2015-10-12 DIAGNOSIS — I2510 Atherosclerotic heart disease of native coronary artery without angina pectoris: Secondary | ICD-10-CM | POA: Diagnosis not present

## 2015-10-12 LAB — CULTURE, BLOOD (ROUTINE X 2)
Culture: NO GROWTH
Culture: NO GROWTH

## 2015-10-12 NOTE — Telephone Encounter (Signed)
Advise patient received blood culture reports. No bacterial growth found. Should finish the antibiotic given at the hospital discharge and keep follow up appointments as scheduled.

## 2015-10-12 NOTE — Patient Outreach (Signed)
Transition of care call successful (week 1, pt discharged 6/9).  Spoke with daughter Mordecai Rasmussen (on [facility] consent form, request to call her for transition of care).   Daughter verified HIPPA on pt.  Daughter reports pt went for lab work at kidney MD's office today, to f/u with MD 6/16.  Daughter reports pt is taking it easy since discharge, appetite improving, walking slowly, walker available if needed. Daughter reports pt needs help with information on nutrition, both he and spouse eat out too much.  Daughter reports she manages both pt and spouse's pill planners.  Daughter reports HH RN ([place]) came plus  Home instead comes for spouse, can add pt if needed.  Daughter reports children are close by to assist if needed.   Daughter reports pt is to f/u with Heart MD 6/15, Dr. Natale Milch 6/26.   RN CM discussed with daughter [facility] transition of care program, plan to f/u weekly (phone calls, a home visit) 31 days post discharge day.  Also, discussed with daughter coworker Journalist, [employer] CM (covering for this RN CM) will be f/u next week telephonically as part of ongoing transition of care and home visit with this RN CM planned for 6/27.    Patient was recently discharged from hospital and all medications have been reviewed- done 6/12.   Plan to inform Dr. Natale Milch of  [facility] involvement.  Zara Chess.   [place] [facility]  [phone]

## 2015-10-13 DIAGNOSIS — K8020 Calculus of gallbladder without cholecystitis without obstruction: Secondary | ICD-10-CM | POA: Diagnosis not present

## 2015-10-13 DIAGNOSIS — I10 Essential (primary) hypertension: Secondary | ICD-10-CM | POA: Diagnosis not present

## 2015-10-13 DIAGNOSIS — I2510 Atherosclerotic heart disease of native coronary artery without angina pectoris: Secondary | ICD-10-CM | POA: Diagnosis not present

## 2015-10-13 DIAGNOSIS — I4891 Unspecified atrial fibrillation: Secondary | ICD-10-CM | POA: Diagnosis not present

## 2015-10-13 DIAGNOSIS — A419 Sepsis, unspecified organism: Secondary | ICD-10-CM | POA: Diagnosis not present

## 2015-10-13 DIAGNOSIS — I255 Ischemic cardiomyopathy: Secondary | ICD-10-CM | POA: Diagnosis not present

## 2015-10-13 NOTE — Telephone Encounter (Signed)
Pt advised-aa 

## 2015-10-14 DIAGNOSIS — I10 Essential (primary) hypertension: Secondary | ICD-10-CM | POA: Diagnosis not present

## 2015-10-14 DIAGNOSIS — A419 Sepsis, unspecified organism: Secondary | ICD-10-CM | POA: Diagnosis not present

## 2015-10-14 DIAGNOSIS — I4891 Unspecified atrial fibrillation: Secondary | ICD-10-CM | POA: Diagnosis not present

## 2015-10-14 DIAGNOSIS — I2510 Atherosclerotic heart disease of native coronary artery without angina pectoris: Secondary | ICD-10-CM | POA: Diagnosis not present

## 2015-10-14 DIAGNOSIS — I255 Ischemic cardiomyopathy: Secondary | ICD-10-CM | POA: Diagnosis not present

## 2015-10-14 DIAGNOSIS — K8020 Calculus of gallbladder without cholecystitis without obstruction: Secondary | ICD-10-CM | POA: Diagnosis not present

## 2015-10-15 DIAGNOSIS — I481 Persistent atrial fibrillation: Secondary | ICD-10-CM | POA: Diagnosis not present

## 2015-10-15 DIAGNOSIS — I420 Dilated cardiomyopathy: Secondary | ICD-10-CM | POA: Diagnosis not present

## 2015-10-15 DIAGNOSIS — I10 Essential (primary) hypertension: Secondary | ICD-10-CM | POA: Diagnosis not present

## 2015-10-15 DIAGNOSIS — F3289 Other specified depressive episodes: Secondary | ICD-10-CM | POA: Diagnosis not present

## 2015-10-15 DIAGNOSIS — E782 Mixed hyperlipidemia: Secondary | ICD-10-CM | POA: Diagnosis not present

## 2015-10-15 DIAGNOSIS — R000 Tachycardia, unspecified: Secondary | ICD-10-CM | POA: Diagnosis not present

## 2015-10-16 DIAGNOSIS — N400 Enlarged prostate without lower urinary tract symptoms: Secondary | ICD-10-CM | POA: Diagnosis not present

## 2015-10-16 DIAGNOSIS — K8020 Calculus of gallbladder without cholecystitis without obstruction: Secondary | ICD-10-CM | POA: Diagnosis not present

## 2015-10-16 DIAGNOSIS — I10 Essential (primary) hypertension: Secondary | ICD-10-CM | POA: Diagnosis not present

## 2015-10-16 DIAGNOSIS — N179 Acute kidney failure, unspecified: Secondary | ICD-10-CM | POA: Diagnosis not present

## 2015-10-16 DIAGNOSIS — I4891 Unspecified atrial fibrillation: Secondary | ICD-10-CM | POA: Diagnosis not present

## 2015-10-16 DIAGNOSIS — A419 Sepsis, unspecified organism: Secondary | ICD-10-CM | POA: Diagnosis not present

## 2015-10-16 DIAGNOSIS — I2510 Atherosclerotic heart disease of native coronary artery without angina pectoris: Secondary | ICD-10-CM | POA: Diagnosis not present

## 2015-10-16 DIAGNOSIS — I255 Ischemic cardiomyopathy: Secondary | ICD-10-CM | POA: Diagnosis not present

## 2015-10-23 ENCOUNTER — Encounter (INDEPENDENT_AMBULATORY_CARE_PROVIDER_SITE_OTHER): Payer: Medicare Other | Admitting: Family Medicine

## 2015-10-23 DIAGNOSIS — I2510 Atherosclerotic heart disease of native coronary artery without angina pectoris: Secondary | ICD-10-CM

## 2015-10-23 DIAGNOSIS — I255 Ischemic cardiomyopathy: Secondary | ICD-10-CM

## 2015-10-23 DIAGNOSIS — K8020 Calculus of gallbladder without cholecystitis without obstruction: Secondary | ICD-10-CM

## 2015-10-23 DIAGNOSIS — A419 Sepsis, unspecified organism: Secondary | ICD-10-CM

## 2015-10-26 ENCOUNTER — Ambulatory Visit (INDEPENDENT_AMBULATORY_CARE_PROVIDER_SITE_OTHER): Payer: Medicare Other | Admitting: Family Medicine

## 2015-10-26 ENCOUNTER — Encounter: Payer: Self-pay | Admitting: Family Medicine

## 2015-10-26 ENCOUNTER — Other Ambulatory Visit: Payer: Self-pay | Admitting: Family Medicine

## 2015-10-26 VITALS — BP 122/68 | HR 120 | Temp 97.8°F | Resp 16 | Wt 176.0 lb

## 2015-10-26 DIAGNOSIS — R000 Tachycardia, unspecified: Secondary | ICD-10-CM | POA: Diagnosis not present

## 2015-10-26 DIAGNOSIS — I481 Persistent atrial fibrillation: Secondary | ICD-10-CM | POA: Diagnosis not present

## 2015-10-26 DIAGNOSIS — R55 Syncope and collapse: Secondary | ICD-10-CM

## 2015-10-26 DIAGNOSIS — A419 Sepsis, unspecified organism: Secondary | ICD-10-CM

## 2015-10-26 DIAGNOSIS — I420 Dilated cardiomyopathy: Secondary | ICD-10-CM

## 2015-10-26 DIAGNOSIS — I4819 Other persistent atrial fibrillation: Secondary | ICD-10-CM

## 2015-10-26 LAB — POCT URINALYSIS DIPSTICK
Bilirubin, UA: NEGATIVE
Blood, UA: NEGATIVE
Glucose, UA: NEGATIVE
Ketones, UA: NEGATIVE
Leukocytes, UA: NEGATIVE
Nitrite, UA: NEGATIVE
Spec Grav, UA: 1.015
Urobilinogen, UA: 0.2
pH, UA: 6

## 2015-10-26 NOTE — Progress Notes (Signed)
Patient: Charles Cook Male    DOB: October 19, 1929   82 y.o.   MRN: [id number] Visit Date: 10/26/2015  Today's Provider: Vernie Murders, PA   Chief Complaint  Patient presents with  . Hospitalization Follow-up  . Loss of Consciousness   Subjective:    HPI Patient comes in today for a hospital follow up. He was admitted into [facility] on 10/05/2015 and was discharged on 10/09/2015. Patient denies any syncopic episodes since he was discharged from the hospital. However, patient reports that he feels very tired and weak.     Allergies  Allergen Reactions  . Moxifloxacin Hcl In Nacl Other (See Comments)    Reaction:  Confusion    Current Meds  Medication Sig  . amiodarone (PACERONE) 200 MG tablet Take 200 mg by mouth daily.   Marland Kitchen apixaban (ELIQUIS) 2.5 MG TABS tablet Take 1 tablet (2.5 mg total) by mouth 2 (two) times daily.  Marland Kitchen aspirin EC 81 MG tablet Take 81 mg by mouth at bedtime.  Marland Kitchen atorvastatin (LIPITOR) 80 MG tablet Take 80 mg by mouth at bedtime.  . Cyanocobalamin (VITAMIN B-12) 5000 MCG SUBL Place 5,000 mcg under the tongue daily.  . digoxin (DIGOX) 0.25 MG tablet Take 0.125 mg by mouth daily.   . diphenhydrAMINE (BENADRYL) 25 MG tablet Take 25 mg by mouth at bedtime. Reported on 10/12/2015  . fluticasone (FLONASE) 50 MCG/ACT nasal spray Place 2 sprays into the nose daily as needed for rhinitis.   . metoprolol succinate (TOPROL-XL) 50 MG 24 hr tablet Take 50 mg by mouth 2 (two) times daily. Take with or immediately following a meal.  . Multiple Vitamin (MULTIVITAMIN WITH MINERALS) TABS tablet Take 1 tablet by mouth daily.  . niacin (NIASPAN) 500 MG CR tablet Take 500 mg by mouth at bedtime.  . ondansetron (ZOFRAN) 4 MG tablet Take 1 tablet (4 mg total) by mouth every 8 (eight) hours as needed for nausea or vomiting.  . sertraline (ZOLOFT) 25 MG tablet Take 25 mg by mouth at bedtime.     Review of Systems  Constitutional: Positive for activity change and fatigue.  Respiratory:  Positive for shortness of breath.   Cardiovascular: Negative for chest pain, palpitations and leg swelling.  Neurological: Negative for dizziness, tremors, seizures, syncope, facial asymmetry, speech difficulty, weakness, light-headedness, numbness and headaches.    Social History  Substance Use Topics  . Smoking status: Never Smoker   . Smokeless tobacco: Never Used  . Alcohol Use: No   Objective:   BP 122/68 mmHg  Pulse 120  Temp(Src) 97.8 F (36.6 C)  Resp 16  Wt 176 lb (79.833 kg)  SpO2 96% Body mass index is 25.25 kg/(m^2).   Physical Exam  Constitutional: He is oriented to person, place, and time. He appears well-developed and well-nourished.  HENT:  Head: Normocephalic.  Eyes: Conjunctivae are normal.  Neck: Neck supple.  No carotid bruit heard.  Cardiovascular:  Irregularly irregular rate.(HR 60-120).  Pulmonary/Chest: Effort normal and breath sounds normal.  Abdominal: Soft. Bowel sounds are normal.  Musculoskeletal: Normal range of motion.  Lymphadenopathy:    He has no cervical adenopathy.  Neurological: He is alert and oriented to person, place, and time.      Assessment & Plan:     1. Tachycardia History of persistent atrial fibrillation with rapid fluctuations in heart rate (60-120). Still on Metoprolol, Amiodarone per Dr. Ubaldo Glassing (cardiologist). EKG today shows atrial flutter with RBBB and history of inferior infarct.  Will recheck labs and continue follow up with Dr. Ubaldo Glassing in the next week or two. Denies chest pains or feeling palpitations. No significant dyspnea. - EKG 12-Lead - CBC with Differential/Platelet - Comprehensive metabolic panel  2. Syncope, unspecified syncope type Hospitalized on 10-05-15 after fainting in the office. Was found to have dehydration and hyponatremia. Also, had ARF with creatintine 1.85 and GFR 32 followed by nephrologist (Dr. Candiss Norse). Eating well and denies dizziness. Still has episodes of weakness but no further syncope. Will  recheck labs and encouraged to follow up with nephrologist. - CBC with Differential/Platelet - Comprehensive metabolic panel  3. Sepsis, due to unspecified organism ([place]) Unidentified infection on blood cultures during hospitalization. Denies fever or dysuria. Suspected urosepsis. Urinalysis normal today. Will recheck labs. Nurse case manager to visit his home tomorrow and has appointment to see his gastroenterologist on 10-29-15 regarding some dysphagia and mucus stools while in the hospital.  - POCT urinalysis dipstick - CBC with Differential/Platelet - Comprehensive metabolic panel  4. Dilated cardiomyopathy ([place]) Echocardiogram in March 2017 showed EF of 30-35% but repeat in the hospital 10-09-15 showed EF 55-60%. Follow up with cardiologist.  5. Persistent atrial fibrillation ([place]) Irregularly irregular heart rate with stable BP. No dizziness or further syncope since hospital stay. Still on Metoprolol, Amiodarone and Digoxin. May need further intervention to stabilize heart rate. Follow up with Dr. Ubaldo Glassing scheduled for 11-17-15.       Vernie Murders, PA  [facility]

## 2015-10-27 ENCOUNTER — Encounter: Payer: Self-pay | Admitting: *Deleted

## 2015-10-27 ENCOUNTER — Other Ambulatory Visit: Payer: Self-pay | Admitting: *Deleted

## 2015-10-27 LAB — CBC WITH DIFFERENTIAL/PLATELET
Basophils Absolute: 0.1 10*3/uL (ref 0.0–0.2)
Basos: 1 %
EOS (ABSOLUTE): 0.5 10*3/uL — ABNORMAL HIGH (ref 0.0–0.4)
Eos: 6 %
Hematocrit: 40.2 % (ref 37.5–51.0)
Hemoglobin: 13.6 g/dL (ref 12.6–17.7)
Immature Grans (Abs): 0 10*3/uL (ref 0.0–0.1)
Immature Granulocytes: 0 %
Lymphocytes Absolute: 1.5 10*3/uL (ref 0.7–3.1)
Lymphs: 16 %
MCH: 32.5 pg (ref 26.6–33.0)
MCHC: 33.8 g/dL (ref 31.5–35.7)
MCV: 96 fL (ref 79–97)
Monocytes Absolute: 1.2 10*3/uL — ABNORMAL HIGH (ref 0.1–0.9)
Monocytes: 14 %
Neutrophils Absolute: 5.8 10*3/uL (ref 1.4–7.0)
Neutrophils: 63 %
Platelets: 264 10*3/uL (ref 150–379)
RBC: 4.19 x10E6/uL (ref 4.14–5.80)
RDW: 16.1 % — ABNORMAL HIGH (ref 12.3–15.4)
WBC: 9.1 10*3/uL (ref 3.4–10.8)

## 2015-10-27 LAB — COMPREHENSIVE METABOLIC PANEL
ALT: 50 IU/L — ABNORMAL HIGH (ref 0–44)
AST: 35 IU/L (ref 0–40)
Albumin/Globulin Ratio: 1.2 (ref 1.2–2.2)
Albumin: 3.8 g/dL (ref 3.5–4.7)
Alkaline Phosphatase: 120 IU/L — ABNORMAL HIGH (ref 39–117)
BUN/Creatinine Ratio: 11 (ref 10–24)
BUN: 14 mg/dL (ref 8–27)
Bilirubin Total: 0.5 mg/dL (ref 0.0–1.2)
CO2: 24 mmol/L (ref 18–29)
Calcium: 8.9 mg/dL (ref 8.6–10.2)
Chloride: 99 mmol/L (ref 96–106)
Creatinine, Ser: 1.33 mg/dL — ABNORMAL HIGH (ref 0.76–1.27)
GFR calc Af Amer: 57 mL/min/{1.73_m2} — ABNORMAL LOW (ref >59)
GFR calc non Af Amer: 49 mL/min/{1.73_m2} — ABNORMAL LOW (ref >59)
Globulin, Total: 3.2 g/dL (ref 1.5–4.5)
Glucose: 119 mg/dL — ABNORMAL HIGH (ref 65–99)
Potassium: 4.6 mmol/L (ref 3.5–5.2)
Sodium: 137 mmol/L (ref 134–144)
Total Protein: 7 g/dL (ref 6.0–8.5)

## 2015-10-28 ENCOUNTER — Encounter: Payer: Self-pay | Admitting: *Deleted

## 2015-10-28 NOTE — Patient Outreach (Signed)
[facility]) Care Management   Home visit 10/27/15  Charles Cook 1929-10-07 [id number]  Charles Cook is an 82 y.o. male  Subjective:  Pt reports being weak, tired, no much of an appetite, started going back  into the office for a couple of hours, today decided to stay home because RN CM was coming.   Pt reports saw MD yesterday, good report, labs and urine taken.   Pt reports he mostly eats out, picks restaurants with healthy choices.  Pt reports weighs daily, staying at 170 lbs. Pt reports no issues with dizziness, careful with transfers.    Objective:   Filed Vitals:   10/27/15 1400  BP: 138/60  Pulse: 54    ROS  Physical Exam  Constitutional: He is oriented to person, place, and time. He appears well-developed.  Cardiovascular: Regular rhythm and normal heart sounds.   HR 54.    Respiratory: Effort normal and breath sounds normal.  GI: Soft. Bowel sounds are normal.  Musculoskeletal: Normal range of motion. He exhibits no edema.  Neurological: He is alert and oriented to person, place, and time.  Skin: Skin is warm and dry.  Psychiatric: He has a normal mood and affect. His behavior is normal. Judgment and thought content normal.    Encounter Medications:   Outpatient Encounter Prescriptions as of 10/27/2015  Medication Sig Note  . amiodarone (PACERONE) 200 MG tablet Take 200 mg by mouth daily.    Marland Kitchen apixaban (ELIQUIS) 2.5 MG TABS tablet Take 1 tablet (2.5 mg total) by mouth 2 (two) times daily.   Marland Kitchen aspirin EC 81 MG tablet Take 81 mg by mouth at bedtime.   Marland Kitchen atorvastatin (LIPITOR) 80 MG tablet Take 80 mg by mouth at bedtime.   . Cyanocobalamin (VITAMIN B-12) 5000 MCG SUBL Place 5,000 mcg under the tongue daily.   . digoxin (DIGOX) 0.25 MG tablet Take 0.125 mg by mouth daily.    . metoprolol succinate (TOPROL-XL) 50 MG 24 hr tablet Take 50 mg by mouth 2 (two) times daily. Take with or immediately following a meal.   . Multiple Vitamin (MULTIVITAMIN WITH  MINERALS) TABS tablet Take 1 tablet by mouth daily.   . niacin (NIASPAN) 500 MG CR tablet Take 500 mg by mouth at bedtime.   . sertraline (ZOLOFT) 25 MG tablet Take 25 mg by mouth at bedtime.    . diphenhydrAMINE (BENADRYL) 25 MG tablet Take 25 mg by mouth at bedtime. Reported on 10/27/2015   . doxycycline (VIBRA-TABS) 100 MG tablet Take 1 tablet (100 mg total) by mouth every 12 (twelve) hours. X 5 more days (Patient not taking: Reported on 10/26/2015)   . fluticasone (FLONASE) 50 MCG/ACT nasal spray Place 2 sprays into the nose daily as needed for rhinitis. Reported on 10/27/2015 10/12/2015: As needed.   . ondansetron (ZOFRAN) 4 MG tablet Take 1 tablet (4 mg total) by mouth every 8 (eight) hours as needed for nausea or vomiting. (Patient not taking: Reported on 10/27/2015) 10/12/2015: Daughter reports pt does not have, does pill planner, to f/u.    No facility-administered encounter medications on file as of 10/27/2015.    Functional Status:   In your present state of health, do you have any difficulty performing the following activities: 10/27/2015 10/05/2015  Hearing? N N  Vision? N N  Difficulty concentrating or making decisions? N N  Walking or climbing stairs? N N  Dressing or bathing? N N  Doing errands, shopping? N [employer]  and eating ? N -  Using the Toilet? N -  In the past six months, have you accidently leaked urine? N -  Do you have problems with loss of bowel control? N -  Managing your Medications? N -  Managing your Finances? N -  Housekeeping or managing your Housekeeping? Y -    Fall/Depression Screening:    PHQ 2/9 Scores 10/27/2015 02/06/2015 02/06/2015  PHQ - 2 Score 0 5 5  PHQ- 9 Score - 14 -    Assessment:   Pleasant 82 year old man, resides with spouse.                             Weakness, tiredness reported.  Lungs clear, HR low.   Falls assessment done- no c/o dizziness                            Transferring from sitting to standing, no sob walking short  distance.                           Appetite- per pt, compromised. Discussed with pt importance of nutrition, Heart Healthy diet (Emmi                             Information provided on Eating out, healthy grocery shopping) in recovery post discharge.   Plan:   Pt to continue to rest, pace activities, allow time for recovery.               Pt to f/u with Dr. Ubaldo Glassing 7/18.             Plan to continue to follow pt for transition of care, f/u again 7/7 telephonically.               Plan to send 6/27 home visit encounter to [facility] by in basket.    [facility] CM Care Plan Problem One        Most Recent Value   Care Plan Problem One  Risk for readmission related to recent hospitalization for Syncope, sepsis    Role Documenting the Problem One  Care Management [place] for Problem One  Active   [facility] Long Term Goal (31-90 days)  Pt would not readmit 31 days from day of discharge.    [facility] Long Term Goal Start Date  10/12/15   Interventions for Problem One Long Term Goal  Home visit done    [facility] CM Short Term Goal #1 (0-30 days)  Pt would have no recurrent syncope episodes in the next 14 days    [facility] CM Short Term Goal #1 Start Date  10/12/15   [facility] CM Short Term Goal #1 Met Date  10/27/15 [met- per pt no recurrent episodes. ]   [facility] CM Short Term Goal #2 (0-30 days)  Pt's weakness would improve in the next 14 days    [facility] CM Short Term Goal #2 Start Date  10/27/15   Interventions for Short Term Goal #2  discussed with pt importance of Healthy diet, rest, pacing activities      Charles Cook M.   [place] Care Management  [phone]

## 2015-10-29 DIAGNOSIS — R1310 Dysphagia, unspecified: Secondary | ICD-10-CM | POA: Diagnosis not present

## 2015-10-29 DIAGNOSIS — R195 Other fecal abnormalities: Secondary | ICD-10-CM | POA: Diagnosis not present

## 2015-11-02 NOTE — Patient Outreach (Signed)
Addendum to chart made 11/02/15.   Documented in cognitive status, HIPAA verified on pt by daughter [name] (on [facility] consent form).     Zara Chess.   [place] Care Management  [phone]

## 2015-11-04 ENCOUNTER — Other Ambulatory Visit: Payer: Self-pay | Admitting: *Deleted

## 2015-11-04 NOTE — Patient Outreach (Signed)
Transition of care call successful (week 4, discharged 6/9).  Spoke with pt, HIPAA verified.   Pt reports strength getting better each day, appetite improved, eating 3 meals a day.   Pt reports no dizziness, compliant with taking medications.   Pt reports no daughter keeps track of MD appointments.   As discussed with pt, plan to f/u again 7/10- final transition of care call, talk then any further community nurse case management needs.    Plan to f/u again 7/10- final transition of care call.   Zara Chess.   [place] Care Management  [phone]

## 2015-11-09 ENCOUNTER — Encounter: Payer: Self-pay | Admitting: *Deleted

## 2015-11-09 ENCOUNTER — Other Ambulatory Visit: Payer: Self-pay | Admitting: *Deleted

## 2015-11-09 NOTE — Patient Outreach (Signed)
Final transition of care call successful( recent hospital admission 6/5-10/09/15 syncope, weakness).   Spoke with Charles Cook, HIPAA verified.  Charles Cook reports doing very well, weakness 90% gone, back to working with no problems.   Charles Cook reports no dizziness,taking all of his medications, appetite good, weight staying at 170 lbs.  Charles Cook reports to f/u with Dr. Ubaldo Glassing soon.   RN CM reinforced with Charles Cook making healthy choices when eating out (review information provided) to which Charles Cook reports he is.   RN CM discussed with Charles Cook today's final transition of care, Charles Cook denies any further case management needs, case to be closed.   Plan to send by in basket to [employer] PA case closure lettter. As discussed with Charles Cook, case closure letter to be sent which includes [facility]'s main contact number to call if  RN CM case management needs  arise in the future.  Plan to inform [facility] care management assistant to close case.   Zara Chess.   [place] Care Management  [phone]

## 2015-11-17 DIAGNOSIS — I420 Dilated cardiomyopathy: Secondary | ICD-10-CM | POA: Diagnosis not present

## 2015-11-17 DIAGNOSIS — I481 Persistent atrial fibrillation: Secondary | ICD-10-CM | POA: Diagnosis not present

## 2015-11-17 DIAGNOSIS — E782 Mixed hyperlipidemia: Secondary | ICD-10-CM | POA: Diagnosis not present

## 2015-11-17 DIAGNOSIS — I25810 Atherosclerosis of coronary artery bypass graft(s) without angina pectoris: Secondary | ICD-10-CM | POA: Diagnosis not present

## 2015-11-17 DIAGNOSIS — R000 Tachycardia, unspecified: Secondary | ICD-10-CM | POA: Diagnosis not present

## 2015-11-17 DIAGNOSIS — I10 Essential (primary) hypertension: Secondary | ICD-10-CM | POA: Diagnosis not present

## 2015-12-15 DIAGNOSIS — I25810 Atherosclerosis of coronary artery bypass graft(s) without angina pectoris: Secondary | ICD-10-CM | POA: Diagnosis not present

## 2015-12-15 DIAGNOSIS — I420 Dilated cardiomyopathy: Secondary | ICD-10-CM | POA: Diagnosis not present

## 2015-12-15 DIAGNOSIS — I10 Essential (primary) hypertension: Secondary | ICD-10-CM | POA: Diagnosis not present

## 2015-12-15 DIAGNOSIS — E782 Mixed hyperlipidemia: Secondary | ICD-10-CM | POA: Diagnosis not present

## 2015-12-15 DIAGNOSIS — I481 Persistent atrial fibrillation: Secondary | ICD-10-CM | POA: Diagnosis not present

## 2016-01-26 ENCOUNTER — Telehealth: Payer: Self-pay

## 2016-01-26 NOTE — Telephone Encounter (Signed)
Patient is due for a follow up office visit with Simona Huh. Tried calling patient. Left a message with his Daughter Benjamine Mola that he needs to schedule an appointment. She states he was in a meeting at the time I called, but he would call back to schedule an appointment afterwards.    FYI: This message is for the [place] who checks patient in during follow up appointment:   Patient will need a PHQ9 repeated during the follow up visit. We need this documentation in his chart before 03/07/2016. Thanks

## 2016-02-05 ENCOUNTER — Telehealth: Payer: Self-pay | Admitting: Family Medicine

## 2016-02-16 ENCOUNTER — Encounter: Payer: Self-pay | Admitting: Family Medicine

## 2016-02-16 ENCOUNTER — Ambulatory Visit (INDEPENDENT_AMBULATORY_CARE_PROVIDER_SITE_OTHER): Payer: Medicare Other | Admitting: Family Medicine

## 2016-02-16 VITALS — BP 124/64 | HR 61 | Temp 97.4°F | Resp 14 | Ht 71.75 in | Wt 177.0 lb

## 2016-02-16 DIAGNOSIS — Z8546 Personal history of malignant neoplasm of prostate: Secondary | ICD-10-CM | POA: Diagnosis not present

## 2016-02-16 DIAGNOSIS — I481 Persistent atrial fibrillation: Secondary | ICD-10-CM

## 2016-02-16 DIAGNOSIS — I4819 Other persistent atrial fibrillation: Secondary | ICD-10-CM

## 2016-02-16 DIAGNOSIS — Z0000 Encounter for general adult medical examination without abnormal findings: Secondary | ICD-10-CM | POA: Diagnosis not present

## 2016-02-16 DIAGNOSIS — E785 Hyperlipidemia, unspecified: Secondary | ICD-10-CM

## 2016-02-16 DIAGNOSIS — Z1211 Encounter for screening for malignant neoplasm of colon: Secondary | ICD-10-CM | POA: Diagnosis not present

## 2016-02-16 DIAGNOSIS — Z23 Encounter for immunization: Secondary | ICD-10-CM

## 2016-02-16 DIAGNOSIS — R251 Tremor, unspecified: Secondary | ICD-10-CM | POA: Diagnosis not present

## 2016-02-16 DIAGNOSIS — I420 Dilated cardiomyopathy: Secondary | ICD-10-CM | POA: Diagnosis not present

## 2016-02-16 DIAGNOSIS — F329 Major depressive disorder, single episode, unspecified: Secondary | ICD-10-CM | POA: Diagnosis not present

## 2016-02-16 LAB — IFOBT (OCCULT BLOOD): IFOBT: NEGATIVE
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
## 2016-02-17 LAB — COMPREHENSIVE METABOLIC PANEL
ALT: 45 IU/L — ABNORMAL HIGH (ref 0–44)
AST: 34 IU/L (ref 0–40)
Albumin/Globulin Ratio: 1.9 (ref 1.2–2.2)
Albumin: 4.4 g/dL (ref 3.5–4.7)
Alkaline Phosphatase: 86 IU/L (ref 39–117)
BUN/Creatinine Ratio: 12 (ref 10–24)
BUN: 17 mg/dL (ref 8–27)
Bilirubin Total: 0.4 mg/dL (ref 0.0–1.2)
CO2: 23 mmol/L (ref 18–29)
Calcium: 8.8 mg/dL (ref 8.6–10.2)
Chloride: 97 mmol/L (ref 96–106)
Creatinine, Ser: 1.4 mg/dL — ABNORMAL HIGH (ref 0.76–1.27)
GFR calc Af Amer: 53 mL/min/{1.73_m2} — ABNORMAL LOW (ref >59)
GFR calc non Af Amer: 46 mL/min/{1.73_m2} — ABNORMAL LOW (ref >59)
Globulin, Total: 2.3 g/dL (ref 1.5–4.5)
Glucose: 96 mg/dL (ref 65–99)
Potassium: 5 mmol/L (ref 3.5–5.2)
Sodium: 136 mmol/L (ref 134–144)
Total Protein: 6.7 g/dL (ref 6.0–8.5)

## 2016-02-17 LAB — CBC WITH DIFFERENTIAL/PLATELET
Basophils Absolute: 0 10*3/uL (ref 0.0–0.2)
Basos: 0 %
EOS (ABSOLUTE): 0.7 10*3/uL — ABNORMAL HIGH (ref 0.0–0.4)
Eos: 8 %
Hematocrit: 42.3 % (ref 37.5–51.0)
Hemoglobin: 14.1 g/dL (ref 12.6–17.7)
Immature Grans (Abs): 0 10*3/uL (ref 0.0–0.1)
Immature Granulocytes: 0 %
Lymphocytes Absolute: 1.5 10*3/uL (ref 0.7–3.1)
Lymphs: 17 %
MCH: 32.8 pg (ref 26.6–33.0)
MCHC: 33.3 g/dL (ref 31.5–35.7)
MCV: 98 fL — ABNORMAL HIGH (ref 79–97)
Monocytes Absolute: 0.9 10*3/uL (ref 0.1–0.9)
Monocytes: 11 %
Neutrophils Absolute: 5.6 10*3/uL (ref 1.4–7.0)
Neutrophils: 64 %
Platelets: 246 10*3/uL (ref 150–379)
RBC: 4.3 x10E6/uL (ref 4.14–5.80)
RDW: 13.9 % (ref 12.3–15.4)
WBC: 8.8 10*3/uL (ref 3.4–10.8)

## 2016-02-17 LAB — LIPID PANEL
Chol/HDL Ratio: 3.1 ratio units (ref 0.0–5.0)
Cholesterol, Total: 152 mg/dL (ref 100–199)
HDL: 49 mg/dL (ref >39)
LDL Calculated: 85 mg/dL (ref 0–99)
Triglycerides: 92 mg/dL (ref 0–149)
VLDL Cholesterol Cal: 18 mg/dL (ref 5–40)

## 2016-02-17 LAB — PSA: Prostate Specific Ag, Serum: 39.8 ng/mL — ABNORMAL HIGH (ref 0.0–4.0)

## 2016-02-17 LAB — TSH: TSH: 0.334 u[IU]/mL — ABNORMAL LOW (ref 0.450–4.500)

## 2016-02-18 ENCOUNTER — Telehealth: Payer: Self-pay

## 2016-02-18 DIAGNOSIS — R972 Elevated prostate specific antigen [PSA]: Secondary | ICD-10-CM

## 2016-02-18 NOTE — Telephone Encounter (Signed)
-----   Message from Margo Common, [place] sent at 02/18/2016 11:14 AM EDT ----- Normal blood cell counts. No sign of anemia. Creatinine slightly elevated and PSA very high. Need to schedule urology referral.

## 2016-02-18 NOTE — Telephone Encounter (Signed)
Patient advised. Patient agrees to proceed with urology referral. Referral ordered.

## 2016-02-23 ENCOUNTER — Telehealth: Payer: Self-pay | Admitting: Family Medicine

## 2016-02-23 DIAGNOSIS — R251 Tremor, unspecified: Secondary | ICD-10-CM

## 2016-02-23 NOTE — Telephone Encounter (Signed)
Schedule referral to neurologist for spontaneous tremor of hands.

## 2016-02-23 NOTE — Telephone Encounter (Signed)
Pt daughter, Charles Cook called to requesting a referral for pt to see a specialist due to his hands shaking.  [phone]

## 2016-03-08 ENCOUNTER — Encounter: Payer: Self-pay | Admitting: Urology

## 2016-03-08 ENCOUNTER — Ambulatory Visit (INDEPENDENT_AMBULATORY_CARE_PROVIDER_SITE_OTHER): Payer: Medicare Other | Admitting: Urology

## 2016-03-08 VITALS — BP 129/69 | HR 58 | Ht 72.0 in | Wt 175.8 lb

## 2016-03-08 DIAGNOSIS — C61 Malignant neoplasm of prostate: Secondary | ICD-10-CM | POA: Diagnosis not present
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
## 2016-03-29 ENCOUNTER — Encounter
Admission: RE | Admit: 2016-03-29 | Discharge: 2016-03-29 | Disposition: A | Discharge status: Home / Self Care | Payer: Medicare Other | Source: Ambulatory Visit | Attending: Urology | Admitting: Urology

## 2016-03-29 ENCOUNTER — Ambulatory Visit
Admission: RE | Admit: 2016-03-29 | Discharge: 2016-03-29 | Disposition: A | Discharge status: Home / Self Care | Payer: Medicare Other | Source: Ambulatory Visit | Attending: Urology | Admitting: Urology

## 2016-03-29 DIAGNOSIS — I700 Atherosclerosis of aorta: Secondary | ICD-10-CM | POA: Diagnosis not present

## 2016-03-29 DIAGNOSIS — K862 Cyst of pancreas: Secondary | ICD-10-CM | POA: Diagnosis not present

## 2016-03-29 DIAGNOSIS — K8020 Calculus of gallbladder without cholecystitis without obstruction: Secondary | ICD-10-CM | POA: Diagnosis not present

## 2016-03-29 DIAGNOSIS — C61 Malignant neoplasm of prostate: Secondary | ICD-10-CM

## 2016-03-29 LAB — POCT I-STAT CREATININE: Creatinine, Ser: 1.4 mg/dL — ABNORMAL HIGH (ref 0.61–1.24)

## 2016-03-29 MED ORDER — TECHNETIUM TC 99M MEDRONATE IV KIT
25.0000 | PACK | Freq: Once | INTRAVENOUS | Status: AC | PRN
  Administered 2016-03-29: 23.31 via INTRAVENOUS

## 2016-03-29 MED ORDER — IOPAMIDOL (ISOVUE-300) INJECTION 61%
85.0000 mL | Freq: Once | INTRAVENOUS | Status: AC | PRN
  Administered 2016-03-29: 85 mL via INTRAVENOUS

## 2016-04-01 ENCOUNTER — Encounter: Payer: Self-pay | Admitting: Urology

## 2016-04-01 ENCOUNTER — Ambulatory Visit (INDEPENDENT_AMBULATORY_CARE_PROVIDER_SITE_OTHER): Payer: Medicare Other | Admitting: Urology

## 2016-04-01 VITALS — BP 165/74 | HR 54 | Ht 72.0 in | Wt 176.1 lb

## 2016-04-01 DIAGNOSIS — Z9189 Other specified personal risk factors, not elsewhere classified: Secondary | ICD-10-CM | POA: Diagnosis not present

## 2016-04-01 DIAGNOSIS — C61 Malignant neoplasm of prostate: Secondary | ICD-10-CM | POA: Diagnosis not present

## 2016-04-01 DIAGNOSIS — M810 Age-related osteoporosis without current pathological fracture: Secondary | ICD-10-CM

## 2016-04-01 DIAGNOSIS — N139 Obstructive and reflux uropathy, unspecified: Secondary | ICD-10-CM

## 2016-04-01 MED ORDER — DEGARELIX ACETATE 120 MG ~~LOC~~ SOLR
240.0000 mg | Freq: Once | SUBCUTANEOUS | Status: AC
  Administered 2016-04-01: 240 mg via SUBCUTANEOUS
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
## 2016-04-01 NOTE — Progress Notes (Signed)
Firmagon Sub Q Injection  Due to Prostate Cancer patient is present today for a Firmagon Injection.   Medication: Mills Koller (Degarelix)  Dose: 240mg  Location: Right and Left upper abdomen Lot: [id number] Exp: 04/2018 Patient tolerated well, no complications were noted  Performed by: Elberta Leatherwood, [place], Donnie [place], [place]

## 2016-04-01 NOTE — Patient Instructions (Signed)
Discussed importance of bone health on ADT, recommend 1000-1200 mg daily calcium suppliment and 800-1000 IU vit D daily.  Also encouraged weight being exercises and cardiovascular health.  

## 2016-04-04 ENCOUNTER — Other Ambulatory Visit: Payer: Self-pay | Admitting: Nurse Practitioner

## 2016-04-04 DIAGNOSIS — R2689 Other abnormalities of gait and mobility: Secondary | ICD-10-CM | POA: Diagnosis not present

## 2016-04-04 DIAGNOSIS — R251 Tremor, unspecified: Secondary | ICD-10-CM

## 2016-04-04 DIAGNOSIS — I481 Persistent atrial fibrillation: Secondary | ICD-10-CM | POA: Diagnosis not present

## 2016-04-07 ENCOUNTER — Ambulatory Visit: Payer: Medicare Other | Attending: Neurology | Admitting: Physical Therapy

## 2016-04-07 ENCOUNTER — Encounter: Payer: Self-pay | Admitting: Physical Therapy

## 2016-04-07 DIAGNOSIS — R2681 Unsteadiness on feet: Secondary | ICD-10-CM
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
## 2016-04-11 ENCOUNTER — Ambulatory Visit: Payer: Medicare Other | Admitting: Physical Therapy

## 2016-04-13 ENCOUNTER — Encounter: Payer: Self-pay | Admitting: Physical Therapy

## 2016-04-13 ENCOUNTER — Ambulatory Visit: Payer: Medicare Other | Admitting: Physical Therapy

## 2016-04-13 DIAGNOSIS — R2681 Unsteadiness on feet: Secondary | ICD-10-CM | POA: Diagnosis not present
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
## 2016-04-13 NOTE — Patient Instructions (Addendum)
Bound: Lateral   Start beside incline. Jump, pushing up and out to side, gaining distance and height. Land on uphill foot only. Push off immediately and repeat downhill, landing on downhill foot only. Repeat _5-10__ times. Repeat on other side for set. Rest _2__ seconds after set. Do _1__ sets per session.  http://plyo.exer.us/93   Copyright  VHI. All rights reserved.  Forward Lunge   Standing with feet shoulder width apart, step forward into lunge with left leg. Hold _2-3___ seconds. Repeat _10___ times per session. Do _1___ sessions per day. Repeat with opposite leg.  Copyright  VHI. All rights reserved.  Four Square Stepping   Stand in right front square of four square pattern. Move clockwise, stepping from one square to next. Keep facing forward. Repeat __10__ times per session. Do _1___ sessions per day.  Copyright  VHI. All rights reserved.  Lower Extremity: Lunge / Reach (Lateral)   Hold ball or object in front of chest. Lunging to side, push ball forward over knee. Repeat _10__ times. Repeat with other leg for set. Rest _2__ seconds after set. Do _1__ sets per session.  http://plyo.exer.us/14   Copyright  VHI. All rights reserved.  Marching in Place: [place] in place, throw ball down to floor. Track ball movement with head and eyes. Repeat __10__ times per session. Do _1___ sessions per day. Repeat on compliant surface: ________.  Copyright  VHI. All rights reserved.  Marching in Place: Ball Throw Hand to [employer] in place, throw ball from hand to hand. Track ball movement with head and eyes. Repeat __10__ times per session. Do __1__ sessions per day. Repeat on compliant surface: ________.  Backward Walking    Walk backward, toes of each foot coming down first. Take long, even strides. Make sure you have a clear pathway with no obstructions when you do this. Repeat 10 laps in kitchen;   Copyright  VHI. All rights reserved.

## 2016-04-18 ENCOUNTER — Ambulatory Visit
Admission: RE | Admit: 2016-04-18 | Discharge: 2016-04-18 | Disposition: A | Discharge status: Home / Self Care | Payer: Medicare Other | Source: Ambulatory Visit | Attending: Nurse Practitioner | Admitting: Nurse Practitioner

## 2016-04-18 DIAGNOSIS — J019 Acute sinusitis, unspecified: Secondary | ICD-10-CM | POA: Diagnosis not present

## 2016-04-18 DIAGNOSIS — R9089 Other abnormal findings on diagnostic imaging of central nervous system: Secondary | ICD-10-CM | POA: Diagnosis not present

## 2016-04-18 DIAGNOSIS — R251 Tremor, unspecified: Secondary | ICD-10-CM | POA: Diagnosis not present

## 2016-04-18 MED ORDER — GADOBENATE DIMEGLUMINE 529 MG/ML IV SOLN
15.0000 mL | Freq: Once | INTRAVENOUS | Status: AC | PRN
  Administered 2016-04-18: 15 mL via INTRAVENOUS

## 2016-04-20 ENCOUNTER — Ambulatory Visit: Payer: Medicare Other | Admitting: Physical Therapy

## 2016-04-27 ENCOUNTER — Encounter: Payer: Self-pay | Admitting: Physical Therapy

## 2016-04-27 ENCOUNTER — Ambulatory Visit: Payer: Medicare Other | Admitting: Physical Therapy

## 2016-04-27 VITALS — BP 175/57 | HR 52

## 2016-04-27 DIAGNOSIS — R2681 Unsteadiness on feet: Secondary | ICD-10-CM | POA: Diagnosis not present
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
## 2016-05-03 ENCOUNTER — Ambulatory Visit: Payer: Medicare Other | Attending: Neurology | Admitting: Physical Therapy

## 2016-05-03 ENCOUNTER — Encounter: Payer: Self-pay | Admitting: Physical Therapy

## 2016-05-03 DIAGNOSIS — R2681 Unsteadiness on feet: Secondary | ICD-10-CM | POA: Diagnosis not present
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
## 2016-05-04 ENCOUNTER — Ambulatory Visit (INDEPENDENT_AMBULATORY_CARE_PROVIDER_SITE_OTHER): Payer: Medicare Other

## 2016-05-04 DIAGNOSIS — Z8546 Personal history of malignant neoplasm of prostate: Secondary | ICD-10-CM | POA: Diagnosis not present

## 2016-05-04 MED ORDER — LEUPROLIDE ACETATE (3 MONTH) 22.5 MG IM KIT
22.5000 mg | PACK | Freq: Once | INTRAMUSCULAR | Status: AC
  Administered 2016-05-04: 22.5 mg via INTRAMUSCULAR

## 2016-05-04 NOTE — Progress Notes (Signed)
IM Injection  Patient is present today for an IM Injection for treatment of prostate cancer Drug: Lupron  Dose:22.5 mg Location:upper left gluteal Lot: [id number] Exp:08/24/2018 Patient tolerated well, no complications were noted  Preformed by: Toniann Fail, LPN   Additional notes/ Follow up: 70mo for lupron and MD in 53mo

## 2016-05-05 ENCOUNTER — Ambulatory Visit: Payer: Medicare Other | Admitting: Physical Therapy

## 2016-05-09 ENCOUNTER — Ambulatory Visit: Payer: Medicare Other | Admitting: Physical Therapy

## 2016-05-09 ENCOUNTER — Encounter: Payer: Self-pay | Admitting: Physical Therapy

## 2016-05-09 DIAGNOSIS — R2681 Unsteadiness on feet: Secondary | ICD-10-CM
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
## 2016-05-11 ENCOUNTER — Ambulatory Visit: Payer: Medicare Other | Admitting: Physical Therapy

## 2016-05-11 ENCOUNTER — Encounter: Payer: Self-pay | Admitting: Physical Therapy

## 2016-05-11 DIAGNOSIS — R2681 Unsteadiness on feet: Secondary | ICD-10-CM
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
## 2016-05-11 NOTE — Patient Instructions (Signed)
Balance, Proprioception: Hip Abduction With Tubing   With tubing attached to both ankles, Standing holding onto counter, kick one leg out to side and then Return.  Repeat _10___ times  On each side.  Do ___2_ sessions per day.  http://cc.exer.us/20    Copyright  VHI. All rights reserved.  Balance, Proprioception: Hip Extension With Tubing   With tubing tied around both legs, holding onto kitchen counter, swing leg back. Return. Repeat _10___ times . Do __2__ sessions per day.  http://cc.exer.us/19    Copyright  VHI. All rights reserved.  Band Walk: Side Stepping   Tie band around legs, around ankles. Step _10__ feet to one side, then step back to start. Repeat _2-3__ feet per session. Note: Small towel between band and skin eases rubbing.  http://plyo.exer.us/76    Copyright  VHI. All rights reserved.   ABDUCTION: Sitting - Exercise Ball: Resistance Band (Active)   Sit with feet flat. With band tied around both legs, Lift right leg slightly and, against resistance band, draw it out to side. Complete __2_ sets of __10_ repetitions. Perform _2__ sessions per day.  Copyright  VHI. All rights reserved.  FLEXION: Sitting - Resistance Band (Active)   Sit, both feet flat. Have band tied around both legs above knees, lift right knee toward ceiling.Repeat with other knee Complete _2__ sets of _10__ repetitions. Perform _2__ sessions per day.  http://gtsc.exer.us/21   Copyright  VHI. All rights reserved.  HIP / KNEE: Extension - Sit to Stand   Sitting, lean chest forward, raise hips up from surface. Straighten hips and knees. Weight bear equally on left and right sides. Backs of legs should not push off surface. __10_ reps per set, __2_ sets per day, _5__ days per week Use assistive device as needed.  Copyright  VHI. All rights reserved.  FLEXION: Sitting - Resistance Band (Active)   Sit with right foot flat. Have band tied around both feet, bend ankle, bringing  toes toward head. Complete __2_ sets of __10_ repetitions. Perform _2__ sessions per day.

## 2016-05-12 ENCOUNTER — Telehealth: Payer: Self-pay | Admitting: Family Medicine

## 2016-05-12 NOTE — Telephone Encounter (Signed)
Blood test of cholesterol was in very good shape in October 2017. Should recheck lipid panel in the next 3-4 weeks to see if the Atorvastatin is needed again.

## 2016-05-12 NOTE — Telephone Encounter (Signed)
Pt's daughter wants to know if her dad is supposed to be taking the atorvastatin (LIPITOR) 80 MG tablet  She says she has not had that filled for him a while and the pharmacy verified that they haven't filled this medication.  Pleaser advise.  Daughter's call back is [phone]  Thanks, Con Memos

## 2016-05-12 NOTE — Telephone Encounter (Signed)
Patient's daughter Charles Cook advised. She states patient is seeing several other providers also, so she will see if Dr. Ubaldo Glassing can order a lipid panel since patient will follow up with him soon.

## 2016-05-16 ENCOUNTER — Encounter: Payer: Self-pay | Admitting: Physical Therapy

## 2016-05-16 ENCOUNTER — Ambulatory Visit: Payer: Medicare Other | Admitting: Physical Therapy

## 2016-05-16 DIAGNOSIS — R2681 Unsteadiness on feet: Secondary | ICD-10-CM | POA: Diagnosis not present
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
## 2016-05-18 ENCOUNTER — Ambulatory Visit: Payer: Medicare Other | Admitting: Physical Therapy

## 2016-05-20 ENCOUNTER — Ambulatory Visit: Payer: Medicare Other | Admitting: Physical Therapy

## 2016-05-20 ENCOUNTER — Encounter: Payer: Self-pay | Admitting: Physical Therapy

## 2016-05-20 DIAGNOSIS — R2681 Unsteadiness on feet: Secondary | ICD-10-CM | POA: Diagnosis not present
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
## 2016-05-24 ENCOUNTER — Encounter: Payer: Self-pay | Admitting: Physical Therapy

## 2016-05-24 ENCOUNTER — Ambulatory Visit: Payer: Medicare Other | Admitting: Physical Therapy

## 2016-05-24 DIAGNOSIS — R2681 Unsteadiness on feet: Secondary | ICD-10-CM | POA: Diagnosis not present
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
## 2016-05-26 ENCOUNTER — Emergency Department: Payer: Medicare Other

## 2016-05-26 ENCOUNTER — Encounter: Payer: Self-pay | Admitting: Emergency Medicine

## 2016-05-26 ENCOUNTER — Ambulatory Visit: Payer: Medicare Other | Admitting: Physical Therapy

## 2016-05-26 ENCOUNTER — Telehealth: Payer: Self-pay

## 2016-05-26 ENCOUNTER — Emergency Department
Admission: EM | Admit: 2016-05-26 | Discharge: 2016-05-26 | Disposition: A | Discharge status: Home / Self Care | Payer: Medicare Other | Attending: Emergency Medicine | Admitting: Emergency Medicine

## 2016-05-26 DIAGNOSIS — Z7982 Long term (current) use of aspirin: Secondary | ICD-10-CM | POA: Insufficient documentation

## 2016-05-26 DIAGNOSIS — I2510 Atherosclerotic heart disease of native coronary artery without angina pectoris: Secondary | ICD-10-CM | POA: Insufficient documentation

## 2016-05-26 DIAGNOSIS — Z79899 Other long term (current) drug therapy: Secondary | ICD-10-CM | POA: Insufficient documentation

## 2016-05-26 DIAGNOSIS — E871 Hypo-osmolality and hyponatremia: Secondary | ICD-10-CM | POA: Diagnosis not present

## 2016-05-26 DIAGNOSIS — Z87891 Personal history of nicotine dependence: Secondary | ICD-10-CM | POA: Diagnosis not present

## 2016-05-26 DIAGNOSIS — I10 Essential (primary) hypertension: Secondary | ICD-10-CM | POA: Insufficient documentation

## 2016-05-26 DIAGNOSIS — R55 Syncope and collapse: Secondary | ICD-10-CM | POA: Diagnosis not present

## 2016-05-26 DIAGNOSIS — Z951 Presence of aortocoronary bypass graft: Secondary | ICD-10-CM | POA: Insufficient documentation

## 2016-05-26 DIAGNOSIS — S069X9A Unspecified intracranial injury with loss of consciousness of unspecified duration, initial encounter: Secondary | ICD-10-CM | POA: Diagnosis not present

## 2016-05-26 LAB — URINALYSIS, COMPLETE (UACMP) WITH MICROSCOPIC
Bacteria, UA: NONE SEEN
Bilirubin Urine: NEGATIVE
Glucose, UA: NEGATIVE mg/dL
Hgb urine dipstick: NEGATIVE
Ketones, ur: NEGATIVE mg/dL
Leukocytes, UA: NEGATIVE
Nitrite: NEGATIVE
Protein, ur: NEGATIVE mg/dL
Specific Gravity, Urine: 1.025 (ref 1.005–1.030)
pH: 5 (ref 5.0–8.0)

## 2016-05-26 LAB — CBC
HCT: 43.1 % (ref 40.0–52.0)
Hemoglobin: 14.9 g/dL (ref 13.0–18.0)
MCH: 34.2 pg — ABNORMAL HIGH (ref 26.0–34.0)
MCHC: 34.6 g/dL (ref 32.0–36.0)
MCV: 98.8 fL (ref 80.0–100.0)
Platelets: 215 10*3/uL (ref 150–440)
RBC: 4.36 MIL/uL — ABNORMAL LOW (ref 4.40–5.90)
RDW: 13.5 % (ref 11.5–14.5)
WBC: 15.2 10*3/uL — ABNORMAL HIGH (ref 3.8–10.6)

## 2016-05-26 LAB — BASIC METABOLIC PANEL
Anion gap: 8 (ref 5–15)
BUN: 28 mg/dL — ABNORMAL HIGH (ref 6–20)
CO2: 26 mmol/L (ref 22–32)
Calcium: 8.6 mg/dL — ABNORMAL LOW (ref 8.9–10.3)
Chloride: 98 mmol/L — ABNORMAL LOW (ref 101–111)
Creatinine, Ser: 1.42 mg/dL — ABNORMAL HIGH (ref 0.61–1.24)
GFR calc Af Amer: 51 mL/min — ABNORMAL LOW (ref >60)
GFR calc non Af Amer: 44 mL/min — ABNORMAL LOW (ref >60)
Glucose, Bld: 131 mg/dL — ABNORMAL HIGH (ref 65–99)
Potassium: 4.8 mmol/L (ref 3.5–5.1)
Sodium: 132 mmol/L — ABNORMAL LOW (ref 135–145)

## 2016-05-26 LAB — HEPATIC FUNCTION PANEL
ALT: 58 U/L (ref 17–63)
AST: 53 U/L — ABNORMAL HIGH (ref 15–41)
Albumin: 4 g/dL (ref 3.5–5.0)
Alkaline Phosphatase: 66 U/L (ref 38–126)
Bilirubin, Direct: 0.2 mg/dL (ref 0.1–0.5)
Indirect Bilirubin: 0.6 mg/dL (ref 0.3–0.9)
Total Bilirubin: 0.8 mg/dL (ref 0.3–1.2)
Total Protein: 7 g/dL (ref 6.5–8.1)

## 2016-05-26 LAB — DIGOXIN LEVEL: Digoxin Level: 1.4 ng/mL (ref 0.8–2.0)

## 2016-05-26 LAB — TROPONIN I: Troponin I: <0.03 ng/mL (ref <0.03)

## 2016-05-26 MED ORDER — ONDANSETRON HCL 4 MG/2ML IJ SOLN
4.0000 mg | Freq: Once | INTRAMUSCULAR | Status: DC
  Filled 2016-05-26: qty 2

## 2016-05-26 MED ORDER — ONDANSETRON 4 MG PO TBDP: ORAL_TABLET | ORAL | 0 refills | Status: DC

## 2016-05-26 MED ORDER — SODIUM CHLORIDE 0.9 % IV BOLUS (SEPSIS)
1000.0000 mL | Freq: Once | INTRAVENOUS | Status: AC
  Administered 2016-05-26: 1000 mL via INTRAVENOUS

## 2016-05-26 NOTE — Telephone Encounter (Signed)
Pt daughter, [name], called stating last night pt had a syncope episode. [name] described incident as pt felt sick after eating dinner, laid down, later got up to get some water, and passed out. Around 1:00am pt wife found pt on floor and pt was not able to get up. No word was given as to how pt was picked up from floor. [name] inquired about Lupron injections causing these type of s/s. Reinforced with [name] Lupron typically does not have those type of side effects. Reinforced with daughter to call PCP as pt may have a virus or something else going on. [name] voiced understanding of whole conversation.
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
## 2016-05-26 NOTE — Discharge Instructions (Signed)
Stay hydrated.   Take zofran as needed for nausea.   Continue your other meds.   See your doctor.   Your sodium level is slightly low. Repeat with your doctor in a week   Return to ER if you have severe abdominal pain, passing out, chest pain, headaches, vomiting, fevers.

## 2016-05-26 NOTE — Telephone Encounter (Addendum)
Pt's daughter Mordecai Rasmussen calling that her father Charles Cook fainted last night. Per daughter her father got up around 10:30-11 pm because he was not feeling well and went to the kitchen to get an Copywriter, advertising and that is when he fainted. Per [name] her mother reports to her that she got up around 1 and found the patient on the floor and that's when he got up and she gave him the [place] and patient vomit once. She reports her father feels week. No chest pain or diarrhea, blurred vision, or arm pain.  Please advise.  Thanks,  -Joseline

## 2016-05-26 NOTE — Telephone Encounter (Addendum)
LMTCB for Charles Cook.  Thanks,  -Joseline

## 2016-05-26 NOTE — Telephone Encounter (Signed)
[name] was advised as directed below, verbalized understanding.  Thanks,  -Joseline

## 2016-05-26 NOTE — ED Triage Notes (Signed)
Patient presents to ED via POV from home with c/o LOC last night at 2300. Patient denies aura. Patient states this has only happened one time before. Denies hitting head. Patient does take eliquis for a fib. Patient states he stayed on the floor for awhile d/t difficulty getting up. Patient states he then had 3 episodes of vomiting. Patient denies any symptoms at this time. Patient states he feels back to baseline at this time. A&O x4.

## 2016-05-26 NOTE — Telephone Encounter (Signed)
Recommend he go to the ER with fainting spells. May need IV hydration with vomiting and evaluation for cardiac arrhythmias or impending stroke.

## 2016-05-31 ENCOUNTER — Ambulatory Visit: Payer: Medicare Other | Admitting: Physical Therapy

## 2016-06-02 ENCOUNTER — Encounter: Payer: Medicare Other | Admitting: Physical Therapy

## 2016-06-07 ENCOUNTER — Encounter: Payer: Self-pay | Admitting: Physical Therapy

## 2016-06-07 ENCOUNTER — Ambulatory Visit: Payer: Medicare Other | Attending: Neurology | Admitting: Physical Therapy

## 2016-06-07 DIAGNOSIS — R2681 Unsteadiness on feet: Secondary | ICD-10-CM | POA: Insufficient documentation
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
## 2016-06-14 ENCOUNTER — Ambulatory Visit: Payer: Medicare Other | Admitting: Physical Therapy

## 2016-06-15 ENCOUNTER — Ambulatory Visit: Payer: Medicare Other | Admitting: Physical Therapy

## 2016-06-17 DIAGNOSIS — I481 Persistent atrial fibrillation: Secondary | ICD-10-CM | POA: Diagnosis not present

## 2016-06-17 DIAGNOSIS — I25810 Atherosclerosis of coronary artery bypass graft(s) without angina pectoris: Secondary | ICD-10-CM | POA: Diagnosis not present

## 2016-06-17 DIAGNOSIS — I10 Essential (primary) hypertension: Secondary | ICD-10-CM | POA: Diagnosis not present

## 2016-06-17 DIAGNOSIS — E782 Mixed hyperlipidemia: Secondary | ICD-10-CM | POA: Diagnosis not present

## 2016-06-17 DIAGNOSIS — I420 Dilated cardiomyopathy: Secondary | ICD-10-CM | POA: Diagnosis not present

## 2016-06-30 ENCOUNTER — Ambulatory Visit: Payer: Medicare Other

## 2016-07-05 DIAGNOSIS — F320 Major depressive disorder, single episode, mild: Secondary | ICD-10-CM | POA: Diagnosis not present

## 2016-07-05 DIAGNOSIS — G4700 Insomnia, unspecified: Secondary | ICD-10-CM | POA: Diagnosis not present

## 2016-07-05 DIAGNOSIS — R251 Tremor, unspecified: Secondary | ICD-10-CM | POA: Diagnosis not present

## 2016-07-05 DIAGNOSIS — I481 Persistent atrial fibrillation: Secondary | ICD-10-CM | POA: Diagnosis not present

## 2016-07-05 DIAGNOSIS — R2689 Other abnormalities of gait and mobility: Secondary | ICD-10-CM | POA: Diagnosis not present

## 2016-08-10 ENCOUNTER — Ambulatory Visit (INDEPENDENT_AMBULATORY_CARE_PROVIDER_SITE_OTHER): Payer: Medicare Other | Admitting: Urology

## 2016-08-10 ENCOUNTER — Encounter: Payer: Self-pay | Admitting: Urology

## 2016-08-10 VITALS — BP 123/64 | HR 68 | Ht 72.0 in | Wt 170.0 lb

## 2016-08-10 DIAGNOSIS — C61 Malignant neoplasm of prostate: Secondary | ICD-10-CM

## 2016-08-10 DIAGNOSIS — N139 Obstructive and reflux uropathy, unspecified: Secondary | ICD-10-CM

## 2016-08-10 DIAGNOSIS — Z9189 Other specified personal risk factors, not elsewhere classified: Secondary | ICD-10-CM

## 2016-08-10 MED ORDER — LEUPROLIDE ACETATE (6 MONTH) 45 MG IM KIT
45.0000 mg | PACK | Freq: Once | INTRAMUSCULAR | Status: AC
  Administered 2016-08-10: 45 mg via INTRAMUSCULAR

## 2016-08-10 NOTE — Progress Notes (Signed)
Lupron IM Injection   Due to Prostate Cancer patient is present today for a Lupron Injection.  Medication: Lupron 6 month Dose: 45 mg  Location: right upper outer buttocks Lot: [id number] Exp: 10/03/2017  Patient tolerated well, no complications were noted  Performed by: Fonnie Jarvis, CMA  Follow up: 17month
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
## 2016-08-11 ENCOUNTER — Telehealth: Payer: Self-pay

## 2016-08-11 ENCOUNTER — Other Ambulatory Visit: Payer: Self-pay | Admitting: Family Medicine

## 2016-08-11 LAB — PSA: Prostate Specific Ag, Serum: 0.5 ng/mL (ref 0.0–4.0)

## 2016-08-11 NOTE — Telephone Encounter (Signed)
That's who I spoke with this morning. She was going to call them for him.  Charles Cook

## 2016-08-11 NOTE — Telephone Encounter (Signed)
So I called his PCP and all he has to do is call [facility] to schedule this. I called the patient and gave him the number and he is calling today to get this set up.   Thanks,   Sharyn Lull

## 2016-08-11 NOTE — Telephone Encounter (Signed)
Cool, thanks. Could you please run this by his daughter as well, I think he has some memory issues.  Hollice Espy, MD

## 2016-08-11 NOTE — Telephone Encounter (Signed)
-----   Message from Hollice Espy, MD sent at 08/10/2016  5:29 PM EDT ----- Regarding: f/u bone density Bone density scan was ordered for this patient but never performed. Can you look into why this was never done?  I would still like him to have it.  Hollice Espy, MD

## 2016-08-12 ENCOUNTER — Telehealth: Payer: Self-pay

## 2016-08-12 NOTE — Telephone Encounter (Signed)
Spoke with pt daughter, [name], in reference to PSA results. [name] voiced understanding.

## 2016-08-12 NOTE — Telephone Encounter (Signed)
LMOM

## 2016-08-12 NOTE — Telephone Encounter (Signed)
-----   Message from Hollice Espy, MD sent at 08/11/2016  2:37 PM EDT ----- Please call the patient's  daughter with resultsWells Guiles  [phone].  PSA down to 0.5 from 39.8.  We will recheck at his next visit.

## 2016-08-17 ENCOUNTER — Ambulatory Visit
Admission: RE | Admit: 2016-08-17 | Discharge: 2016-08-17 | Disposition: A | Discharge status: Home / Self Care | Payer: Medicare Other | Source: Ambulatory Visit | Attending: Urology | Admitting: Urology

## 2016-08-17 DIAGNOSIS — M810 Age-related osteoporosis without current pathological fracture: Secondary | ICD-10-CM | POA: Diagnosis present

## 2016-08-17 DIAGNOSIS — M8588 Other specified disorders of bone density and structure, other site: Secondary | ICD-10-CM | POA: Diagnosis not present

## 2016-08-17 DIAGNOSIS — M85851 Other specified disorders of bone density and structure, right thigh: Secondary | ICD-10-CM | POA: Diagnosis not present

## 2016-08-25 ENCOUNTER — Ambulatory Visit
Admission: RE | Admit: 2016-08-25 | Discharge: 2016-08-25 | Disposition: A | Discharge status: Home / Self Care | Payer: Medicare Other | Source: Ambulatory Visit | Attending: Physician Assistant | Admitting: Physician Assistant

## 2016-08-25 ENCOUNTER — Ambulatory Visit (INDEPENDENT_AMBULATORY_CARE_PROVIDER_SITE_OTHER): Payer: Medicare Other | Admitting: Physician Assistant

## 2016-08-25 ENCOUNTER — Encounter: Payer: Self-pay | Admitting: Physician Assistant

## 2016-08-25 ENCOUNTER — Telehealth: Payer: Self-pay

## 2016-08-25 VITALS — BP 152/76 | HR 60 | Temp 97.8°F | Resp 16 | Wt 184.0 lb

## 2016-08-25 DIAGNOSIS — M25552 Pain in left hip: Secondary | ICD-10-CM | POA: Diagnosis not present

## 2016-08-25 DIAGNOSIS — W19XXXA Unspecified fall, initial encounter: Secondary | ICD-10-CM | POA: Diagnosis not present

## 2016-08-25 DIAGNOSIS — S59902A Unspecified injury of left elbow, initial encounter: Secondary | ICD-10-CM | POA: Diagnosis not present

## 2016-08-25 DIAGNOSIS — S51012A Laceration without foreign body of left elbow, initial encounter: Secondary | ICD-10-CM | POA: Insufficient documentation

## 2016-08-25 DIAGNOSIS — S7002XA Contusion of left hip, initial encounter: Secondary | ICD-10-CM | POA: Insufficient documentation

## 2016-08-25 DIAGNOSIS — S79912A Unspecified injury of left hip, initial encounter: Secondary | ICD-10-CM | POA: Diagnosis not present

## 2016-08-25 DIAGNOSIS — M25522 Pain in left elbow: Secondary | ICD-10-CM | POA: Diagnosis not present

## 2016-08-25 NOTE — Progress Notes (Signed)
Patient: Charles Cook Male    DOB: 1929-12-18   83 y.o.   MRN: [id number] Visit Date: 08/25/2016  Today's Provider: Mar Daring, PA-C   Chief Complaint  Patient presents with  . Fall   Subjective:    Pt fell in the shower in a hotel in [place] that he was staying at for a conference. Pt denies lightheadedness or syncope as cause. Pt was getting out of shower, placed his foot on the towel outside of the tub. The towel slipped due to being on a marble floor without anything to hold it in place and no rug. He landed directly on his left elbow on the floor and then his left hip. He did not hit his head. He did split his elbow open. He bled quite a bit from being on blood thinners. The first aid facilities at the hotel cleaned him up and held pressure to stop the bleeding. No further immediate care was sought.   Fall  Incident onset: yesterday morning at 7:00 am. He fell from a height of 3 to 5 ft. He landed on hard floor. The point of impact was the left elbow and left hip. The pain is present in the left hip and left elbow. The pain is at a severity of 7/10. The pain is moderate. The symptoms are aggravated by ambulation and pressure on injury. Pertinent negatives include no abdominal pain, headaches, loss of consciousness, nausea, numbness, tingling, visual change or vomiting. He has tried NSAID for the symptoms. The treatment provided moderate relief.   Pt is currently taking Eliquis and ASA 81 mg. Pt notes a large bruise on his hip. Pt has fallen about 5 times in the last year. Pt is seeing neurology, who referred pt to balance therapy. Pt is no longer in this therapy, but is trying to get back into this program. 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 He also reports he does feel more fatigued after work and most of his falls have been during that time. He did have improvement in his strength  and balance during PT.     Allergies  Allergen Reactions  . Moxifloxacin Hcl In Nacl Other (See Comments)    Reaction:  Confusion      Current Outpatient Prescriptions:  .  amiodarone (PACERONE) 200 MG tablet, Take 200 mg by mouth daily. , Disp: , Rfl:  .  apixaban (ELIQUIS) 2.5 MG TABS tablet, Take 1 tablet (2.5 mg total) by mouth 2 (two) times daily., Disp: 60 tablet, Rfl: 1 .  aspirin EC 81 MG tablet, Take 81 mg by mouth at bedtime., Disp: , Rfl:  .  atorvastatin (LIPITOR) 80 MG tablet, Take 80 mg by mouth at bedtime., Disp: , Rfl:  .  Calcium Carbonate-Vitamin D (CALCIUM-D) 600-400 MG-UNIT TABS, Take 1 tablet by mouth daily., Disp: , Rfl:  .  Cyanocobalamin (VITAMIN B-12) 5000 MCG SUBL, Place 5,000 mcg under the tongue daily., Disp: , Rfl:  .  digoxin (DIGOX) 0.25 MG tablet, Take 0.125 mg by mouth daily. , Disp: , Rfl:  .  diphenhydrAMINE (BENADRYL) 25 MG tablet, Take 25 mg by mouth at bedtime as needed for allergies. Reported on 10/27/2015, Disp: , Rfl:  .  fluticasone (FLONASE) 50 MCG/ACT nasal spray, Place 2 sprays into the nose daily as needed for rhinitis. Reported on 10/27/2015, Disp: , Rfl:  .  Leuprolide Acetate, 6 Month, (LUPRON DEPOT, 85-MONTH, IM), Inject into the muscle every 6 (six) months., Disp: , Rfl:  .  metoprolol succinate (TOPROL-XL) 50 MG 24 hr tablet, Take 50 mg by mouth 2 (two) times daily. Take with or immediately following a meal., Disp: , Rfl:  .  Multiple Vitamin (MULTIVITAMIN WITH MINERALS) TABS tablet, Take 1 tablet by mouth daily., Disp: , Rfl:  .  niacin (NIASPAN) 500 MG CR tablet, TAKE ONE TABLET BY MOUTH ONCE DAILY, Disp: 90 tablet, Rfl: 3 .  traZODone (DESYREL) 50 MG tablet, , Disp: , Rfl: 0  Review of Systems  Gastrointestinal: Negative for abdominal pain, nausea and vomiting.  Skin: Positive for wound.  Neurological: Negative for tingling, loss of consciousness, syncope, numbness and headaches.  Hematological: Bruises/bleeds easily.    Social  History  Substance Use Topics  . Smoking status: Never Smoker  . Smokeless tobacco: Never Used  . Alcohol use 0.0 oz/week     Comment: one glass in 6 months    Objective:   BP (!) 152/76 (BP Location: Right Arm, Patient Position: Sitting, Cuff Size: Normal)   Pulse 60   Temp 97.8 F (36.6 C) (Oral)   Resp 16   Wt 184 lb (83.5 kg)   BMI 24.95 kg/m  Vitals:   08/25/16 1030  BP: (!) 152/76  Pulse: 60  Resp: 16  Temp: 97.8 F (36.6 C)  TempSrc: Oral  Weight: 184 lb (83.5 kg)     Physical Exam  Constitutional: He appears well-developed and well-nourished. No distress.  HENT:  Head: Normocephalic and atraumatic.  Neck: Normal range of motion. Neck supple.  Cardiovascular: Normal rate, regular rhythm and normal heart sounds.  Exam reveals no gallop and no friction rub.   No murmur heard. Pulmonary/Chest: Effort normal and breath sounds normal. No respiratory distress. He has no wheezes. He has no rales.  Musculoskeletal: Normal range of motion. He exhibits no edema.       Left elbow: He exhibits swelling. He exhibits normal range of motion, no effusion, no deformity and no laceration. No tenderness found.       Left hip: Normal. He exhibits normal range of motion, normal strength, no tenderness, no bony tenderness and no swelling.  Skin: Ecchymosis and laceration noted. He is not diaphoretic.     Vitals reviewed.     Assessment & Plan:     1. Fall, initial encounter Will get imaging as below of points of contact from fall. Recent BMD showed osteopenia. Has recently been started on Vit D and Calcium supplements due to being on Lupron for prostate cancer. I will follow up pending the results of the imaging.  - DG Elbow Complete Left; Future - DG HIP UNILAT WITH PELVIS 2-3 VIEWS LEFT; Future  2. Elbow laceration, left, initial encounter Laceration was cleaned today. 2 steri-strips placed to close the wound. Dry dressing will be applied following xray to cover. Do not remove  steri-strips. Patient advised they will fall off on their own in 7-10 days. Wash wound with soap and water daily. May recover if needed for drainage. Call if wound starts to look infected.  - DG Elbow Complete Left; Future  3. Traumatic hematoma of hip, left, initial encounter Advised to apply heating pad to area with clothing between heating pad and skin to avoid burn. Call if develops sharp, shooting, stabbing, burning pain or if ROM or weight bearing becomes intolerable.  - DG HIP UNILAT WITH PELVIS 2-3 VIEWS LEFT; Future  Patient seen and examined by Mar Daring, PA-C, and note scribed by Renaldo Fiddler, CMA.  Mar Daring, PA-C  [facility]

## 2016-08-25 NOTE — Telephone Encounter (Signed)
Patient calling back. Jiles Garter had called patient regarding the x-ray results. Per Jenni Hip and elbow xrays were normal.Patient advised.  Thanks,  -Zyon Rosser

## 2016-08-25 NOTE — Patient Instructions (Signed)
Fall Prevention in the Home Falls can cause injuries. They can happen to people of all ages. There are many things you can do to make your home safe and to help prevent falls. What can I do on the outside of my home?  Regularly fix the edges of walkways and driveways and fix any cracks.  Remove anything that might make you trip as you walk through a door, such as a raised step or threshold.  Trim any bushes or trees on the path to your home.  Use bright outdoor lighting.  Clear any walking paths of anything that might make someone trip, such as rocks or tools.  Regularly check to see if handrails are loose or broken. Make sure that both sides of any steps have handrails.  Any raised decks and porches should have guardrails on the edges.  Have any leaves, snow, or ice cleared regularly.  Use sand or salt on walking paths during winter.  Clean up any spills in your garage right away. This includes oil or grease spills. What can I do in the bathroom?  Use night lights.  Install grab bars by the toilet and in the tub and shower. Do not use towel bars as grab bars.  Use non-skid mats or decals in the tub or shower.  If you need to sit down in the shower, use a plastic, non-slip stool.  Keep the floor dry. Clean up any water that spills on the floor as soon as it happens.  Remove soap buildup in the tub or shower regularly.  Attach bath mats securely with double-sided non-slip rug tape.  Do not have throw rugs and other things on the floor that can make you trip. What can I do in the bedroom?  Use night lights.  Make sure that you have a light by your bed that is easy to reach.  Do not use any sheets or blankets that are too big for your bed. They should not hang down onto the floor.  Have a firm chair that has side arms. You can use this for support while you get dressed.  Do not have throw rugs and other things on the floor that can make you trip. What can I do in the  kitchen?  Clean up any spills right away.  Avoid walking on wet floors.  Keep items that you use a lot in easy-to-reach places.  If you need to reach something above you, use a strong step stool that has a grab bar.  Keep electrical cords out of the way.  Do not use floor polish or wax that makes floors slippery. If you must use wax, use non-skid floor wax.  Do not have throw rugs and other things on the floor that can make you trip. What can I do with my stairs?  Do not leave any items on the stairs.  Make sure that there are handrails on both sides of the stairs and use them. Fix handrails that are broken or loose. Make sure that handrails are as long as the stairways.  Check any carpeting to make sure that it is firmly attached to the stairs. Fix any carpet that is loose or worn.  Avoid having throw rugs at the top or bottom of the stairs. If you do have throw rugs, attach them to the floor with carpet tape.  Make sure that you have a light switch at the top of the stairs and the bottom of the stairs. If you do   not have them, ask someone to add them for you. What else can I do to help prevent falls?  Wear shoes that:  Do not have high heels.  Have rubber bottoms.  Are comfortable and fit you well.  Are closed at the toe. Do not wear sandals.  If you use a stepladder:  Make sure that it is fully opened. Do not climb a closed stepladder.  Make sure that both sides of the stepladder are locked into place.  Ask someone to hold it for you, if possible.  Clearly mark and make sure that you can see:  Any grab bars or handrails.  First and last steps.  Where the edge of each step is.  Use tools that help you move around (mobility aids) if they are needed. These include:  Canes.  Walkers.  Scooters.  Crutches.  Turn on the lights when you go into a dark area. Replace any light bulbs as soon as they burn out.  Set up your furniture so you have a clear path.  Avoid moving your furniture around.  If any of your floors are uneven, fix them.  If there are any pets around you, be aware of where they are.  Review your medicines with your doctor. Some medicines can make you feel dizzy. This can increase your chance of falling. Ask your doctor what other things that you can do to help prevent falls. This information is not intended to replace advice given to you by your health care provider. Make sure you discuss any questions you have with your health care provider. Document Released: 02/12/2009 Document Revised: 09/24/2015 Document Reviewed: 05/23/2014 Elsevier Interactive Patient Education  2017 [employer]  

## 2016-09-01 ENCOUNTER — Encounter: Payer: Self-pay | Admitting: Physician Assistant

## 2016-09-01 ENCOUNTER — Ambulatory Visit (INDEPENDENT_AMBULATORY_CARE_PROVIDER_SITE_OTHER): Payer: Medicare Other | Admitting: Physician Assistant

## 2016-09-01 VITALS — BP 130/70 | HR 56 | Temp 98.0°F | Resp 16 | Wt 183.6 lb

## 2016-09-01 DIAGNOSIS — S8012XD Contusion of left lower leg, subsequent encounter: Secondary | ICD-10-CM | POA: Diagnosis not present

## 2016-09-01 DIAGNOSIS — S51012D Laceration without foreign body of left elbow, subsequent encounter: Secondary | ICD-10-CM

## 2016-09-01 DIAGNOSIS — W19XXXD Unspecified fall, subsequent encounter: Secondary | ICD-10-CM

## 2016-09-01 NOTE — Patient Instructions (Signed)
Hematoma A hematoma is a collection of blood. The collection of blood can turn into a hard, painful lump under the skin. Your skin may turn blue or yellow if the hematoma is close to the surface of the skin. Most hematomas get better in a few days to weeks. Some hematomas are serious and need medical care. Hematomas can be very small or very big. Follow these instructions at home:  Apply ice to the injured area: ? Put ice in a plastic bag. ? Place a towel between your skin and the bag. ? Leave the ice on for 20 minutes, 2-3 times a day for the first 1 to 2 days.  After the first 2 days, switch to using warm packs on the injured area.  Raise (elevate) the injured area to lessen pain and puffiness (swelling). You may also wrap the area with an elastic bandage. Make sure the bandage is not wrapped too tight.  If you have a painful hematoma on your leg or foot, you may use crutches for a couple days.  Only take medicines as told by your doctor. Get help right away if:  Your pain gets worse.  Your pain is not controlled with medicine.  You have a fever.  Your puffiness gets worse.  Your skin turns more blue or yellow.  Your skin over the hematoma breaks or starts bleeding.  Your hematoma is in your chest or belly (abdomen) and you are short of breath, feel weak, or have a change in consciousness.  Your hematoma is on your scalp and you have a headache that gets worse or a change in alertness or consciousness. This information is not intended to replace advice given to you by your health care provider. Make sure you discuss any questions you have with your health care provider. Document Released: 05/26/2004 Document Revised: 09/24/2015 Document Reviewed: 09/26/2012 Elsevier Interactive Patient Education  2017 [employer]  

## 2016-09-01 NOTE — Progress Notes (Signed)
Patient: Charles Cook Male    DOB: 26-Apr-1930   83 y.o.   MRN: [id number] Visit Date: 09/01/2016  Today's Provider: Mar Daring, PA-C   Chief Complaint  Patient presents with  . Follow-up    Left Hip    Subjective:    HPI Patient is here today to follow-up on bruise left hip, looking a little red and feels warm. Patient was seen 04/26 for a fall he had on 04/25 in a hotel in [place] while in the shower. X-rays obtained and results were normal. Daughter is also concern about his left elbow looking a little red. He has been using a heating pad on his left hip and putting Neosporin Ointment on his elbow.     Allergies  Allergen Reactions  . Moxifloxacin Hcl In Nacl Other (See Comments)    Reaction:  Confusion      Current Outpatient Prescriptions:  .  amiodarone (PACERONE) 200 MG tablet, Take 200 mg by mouth daily. , Disp: , Rfl:  .  apixaban (ELIQUIS) 2.5 MG TABS tablet, Take 1 tablet (2.5 mg total) by mouth 2 (two) times daily., Disp: 60 tablet, Rfl: 1 .  aspirin EC 81 MG tablet, Take 81 mg by mouth at bedtime., Disp: , Rfl:  .  atorvastatin (LIPITOR) 80 MG tablet, Take 80 mg by mouth at bedtime., Disp: , Rfl:  .  Calcium Carbonate-Vitamin D (CALCIUM-D) 600-400 MG-UNIT TABS, Take 1 tablet by mouth daily., Disp: , Rfl:  .  Cyanocobalamin (VITAMIN B-12) 5000 MCG SUBL, Place 5,000 mcg under the tongue daily., Disp: , Rfl:  .  digoxin (DIGOX) 0.25 MG tablet, Take 0.125 mg by mouth daily. , Disp: , Rfl:  .  diphenhydrAMINE (BENADRYL) 25 MG tablet, Take 25 mg by mouth at bedtime as needed for allergies. Reported on 10/27/2015, Disp: , Rfl:  .  fluticasone (FLONASE) 50 MCG/ACT nasal spray, Place 2 sprays into the nose daily as needed for rhinitis. Reported on 10/27/2015, Disp: , Rfl:  .  Leuprolide Acetate, 6 Month, (LUPRON DEPOT, 80-MONTH, IM), Inject into the muscle every 6 (six) months., Disp: , Rfl:  .  metoprolol succinate (TOPROL-XL) 50 MG 24 hr tablet, Take 50 mg by  mouth 2 (two) times daily. Take with or immediately following a meal., Disp: , Rfl:  .  Multiple Vitamin (MULTIVITAMIN WITH MINERALS) TABS tablet, Take 1 tablet by mouth daily., Disp: , Rfl:  .  niacin (NIASPAN) 500 MG CR tablet, TAKE ONE TABLET BY MOUTH ONCE DAILY, Disp: 90 tablet, Rfl: 3 .  traZODone (DESYREL) 50 MG tablet, , Disp: , Rfl: 0  Review of Systems  Constitutional: Negative.   Respiratory: Negative.   Cardiovascular: Negative.   Gastrointestinal: Negative.   Musculoskeletal: Negative for back pain and gait problem.  Skin: Positive for color change and wound.  Neurological: Negative for weakness and numbness.    Social History  Substance Use Topics  . Smoking status: Never Smoker  . Smokeless tobacco: Never Used  . Alcohol use 0.0 oz/week     Comment: one glass in 6 months    Objective:   BP 130/70 (BP Location: Right Arm, Patient Position: Sitting, Cuff Size: Large)   Pulse (!) 56   Temp 98 F (36.7 C) (Oral)   Resp 16   Wt 183 lb 9.6 oz (83.3 kg)   BMI 24.90 kg/m    Physical Exam  Constitutional: He appears well-developed and well-nourished. No distress.  Skin: Ecchymosis and  laceration noted. He is not diaphoretic.     Vitals reviewed.       Assessment & Plan:     1. Hematoma of lower limb, left, subsequent encounter Healing hematoma. Advised to continue heat to the area and add light massage to start breaking up the clotted tissues there. Mild pain to palpation. Continue to monitor and call if no improvement.    2. Laceration of left elbow, subsequent encounter Normal healing.   3. Fall, subsequent encounter Continues to improve.        Mar Daring, PA-C  [facility]

## 2016-10-20 ENCOUNTER — Encounter: Payer: Self-pay | Admitting: Physical Therapy

## 2016-10-20 ENCOUNTER — Ambulatory Visit: Payer: Medicare Other | Attending: Neurology | Admitting: Physical Therapy

## 2016-10-20 DIAGNOSIS — R262 Difficulty in walking, not elsewhere classified: Secondary | ICD-10-CM | POA: Insufficient documentation

## 2016-10-20 DIAGNOSIS — R2681 Unsteadiness on feet: Secondary | ICD-10-CM | POA: Diagnosis not present
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
## 2016-10-24 ENCOUNTER — Ambulatory Visit: Payer: Medicare Other | Admitting: Physical Therapy

## 2016-10-26 ENCOUNTER — Encounter: Payer: Self-pay | Admitting: Physical Therapy

## 2016-10-26 ENCOUNTER — Ambulatory Visit: Payer: Medicare Other | Admitting: Physical Therapy

## 2016-10-26 DIAGNOSIS — R2681 Unsteadiness on feet: Secondary | ICD-10-CM

## 2016-10-26 DIAGNOSIS — R262 Difficulty in walking, not elsewhere classified: Secondary | ICD-10-CM
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
## 2016-11-03 ENCOUNTER — Ambulatory Visit: Payer: Medicare Other | Attending: Neurology | Admitting: Physical Therapy

## 2016-11-03 DIAGNOSIS — R2681 Unsteadiness on feet: Secondary | ICD-10-CM | POA: Insufficient documentation

## 2016-11-03 DIAGNOSIS — R262 Difficulty in walking, not elsewhere classified: Secondary | ICD-10-CM | POA: Insufficient documentation

## 2016-11-07 ENCOUNTER — Ambulatory Visit: Payer: Medicare Other | Admitting: Physical Therapy

## 2016-11-09 ENCOUNTER — Ambulatory Visit: Payer: Medicare Other | Admitting: Physician Assistant

## 2016-11-09 ENCOUNTER — Encounter: Payer: Self-pay | Admitting: Physical Therapy

## 2016-11-09 ENCOUNTER — Ambulatory Visit: Payer: Medicare Other | Admitting: Physical Therapy

## 2016-11-09 DIAGNOSIS — R2681 Unsteadiness on feet: Secondary | ICD-10-CM

## 2016-11-09 DIAGNOSIS — R262 Difficulty in walking, not elsewhere classified: Secondary | ICD-10-CM | POA: Diagnosis not present
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
## 2016-11-14 ENCOUNTER — Ambulatory Visit: Payer: Medicare Other

## 2016-11-14 DIAGNOSIS — R262 Difficulty in walking, not elsewhere classified: Secondary | ICD-10-CM

## 2016-11-14 DIAGNOSIS — R2681 Unsteadiness on feet: Secondary | ICD-10-CM | POA: Diagnosis not present
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
## 2016-11-16 ENCOUNTER — Ambulatory Visit: Payer: Medicare Other

## 2016-11-16 VITALS — BP 168/87 | HR 58

## 2016-11-16 DIAGNOSIS — R2681 Unsteadiness on feet: Secondary | ICD-10-CM

## 2016-11-16 DIAGNOSIS — R262 Difficulty in walking, not elsewhere classified: Secondary | ICD-10-CM
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
## 2016-11-21 ENCOUNTER — Ambulatory Visit: Payer: Medicare Other | Admitting: Physical Therapy

## 2016-11-23 ENCOUNTER — Encounter: Payer: Self-pay | Admitting: Physical Therapy

## 2016-11-23 ENCOUNTER — Ambulatory Visit: Payer: Medicare Other | Admitting: Physical Therapy

## 2016-11-23 DIAGNOSIS — R2681 Unsteadiness on feet: Secondary | ICD-10-CM

## 2016-11-23 DIAGNOSIS — R262 Difficulty in walking, not elsewhere classified: Secondary | ICD-10-CM
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
## 2016-11-28 ENCOUNTER — Ambulatory Visit: Payer: Medicare Other | Admitting: Physical Therapy

## 2016-11-28 ENCOUNTER — Encounter: Payer: Self-pay | Admitting: Physical Therapy

## 2016-11-28 VITALS — BP 155/56 | HR 78

## 2016-11-28 DIAGNOSIS — R262 Difficulty in walking, not elsewhere classified: Secondary | ICD-10-CM | POA: Diagnosis not present

## 2016-11-28 DIAGNOSIS — R2681 Unsteadiness on feet: Secondary | ICD-10-CM | POA: Diagnosis not present
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
## 2016-11-30 ENCOUNTER — Encounter: Payer: Self-pay | Admitting: Physical Therapy

## 2016-11-30 ENCOUNTER — Ambulatory Visit: Payer: Medicare Other | Attending: Neurology | Admitting: Physical Therapy

## 2016-11-30 DIAGNOSIS — R262 Difficulty in walking, not elsewhere classified: Secondary | ICD-10-CM | POA: Diagnosis not present

## 2016-11-30 DIAGNOSIS — R2681 Unsteadiness on feet: Secondary | ICD-10-CM | POA: Insufficient documentation
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
## 2016-12-01 DIAGNOSIS — I481 Persistent atrial fibrillation: Secondary | ICD-10-CM | POA: Diagnosis not present

## 2016-12-01 DIAGNOSIS — I25810 Atherosclerosis of coronary artery bypass graft(s) without angina pectoris: Secondary | ICD-10-CM | POA: Diagnosis not present

## 2016-12-01 DIAGNOSIS — I10 Essential (primary) hypertension: Secondary | ICD-10-CM | POA: Diagnosis not present

## 2016-12-01 DIAGNOSIS — I420 Dilated cardiomyopathy: Secondary | ICD-10-CM | POA: Diagnosis not present

## 2016-12-01 DIAGNOSIS — R000 Tachycardia, unspecified: Secondary | ICD-10-CM | POA: Diagnosis not present

## 2016-12-01 DIAGNOSIS — I4891 Unspecified atrial fibrillation: Secondary | ICD-10-CM | POA: Diagnosis not present

## 2016-12-01 DIAGNOSIS — E782 Mixed hyperlipidemia: Secondary | ICD-10-CM | POA: Diagnosis not present

## 2016-12-06 ENCOUNTER — Ambulatory Visit: Payer: Medicare Other | Admitting: Physical Therapy

## 2016-12-06 ENCOUNTER — Encounter: Payer: Self-pay | Admitting: Physical Therapy

## 2016-12-06 VITALS — BP 154/61 | HR 67

## 2016-12-06 DIAGNOSIS — R262 Difficulty in walking, not elsewhere classified: Secondary | ICD-10-CM | POA: Diagnosis not present

## 2016-12-06 DIAGNOSIS — R2681 Unsteadiness on feet: Secondary | ICD-10-CM | POA: Diagnosis not present
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
## 2016-12-08 ENCOUNTER — Encounter: Payer: Self-pay | Admitting: Physical Therapy

## 2016-12-08 ENCOUNTER — Ambulatory Visit: Payer: Medicare Other | Admitting: Physical Therapy

## 2016-12-08 DIAGNOSIS — R262 Difficulty in walking, not elsewhere classified: Secondary | ICD-10-CM

## 2016-12-08 DIAGNOSIS — R2681 Unsteadiness on feet: Secondary | ICD-10-CM | POA: Diagnosis not present
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
## 2016-12-13 ENCOUNTER — Encounter: Payer: Self-pay | Admitting: Physical Therapy

## 2016-12-13 ENCOUNTER — Ambulatory Visit: Payer: Medicare Other | Admitting: Physical Therapy

## 2016-12-13 DIAGNOSIS — R262 Difficulty in walking, not elsewhere classified: Secondary | ICD-10-CM | POA: Diagnosis not present

## 2016-12-13 DIAGNOSIS — R2681 Unsteadiness on feet: Secondary | ICD-10-CM
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
## 2016-12-15 ENCOUNTER — Encounter: Payer: Self-pay | Admitting: Physical Therapy

## 2016-12-15 ENCOUNTER — Ambulatory Visit: Payer: Medicare Other

## 2016-12-15 VITALS — BP 184/67 | HR 94

## 2016-12-15 DIAGNOSIS — R2681 Unsteadiness on feet: Secondary | ICD-10-CM | POA: Diagnosis not present

## 2016-12-15 DIAGNOSIS — R262 Difficulty in walking, not elsewhere classified: Secondary | ICD-10-CM
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
## 2016-12-20 ENCOUNTER — Ambulatory Visit: Payer: Medicare Other | Admitting: Physical Therapy

## 2016-12-27 ENCOUNTER — Ambulatory Visit: Payer: Medicare Other | Admitting: Physical Therapy

## 2017-01-04 ENCOUNTER — Ambulatory Visit: Payer: Medicare Other | Admitting: Physical Therapy

## 2017-01-09 ENCOUNTER — Ambulatory Visit: Payer: Medicare Other | Admitting: Physical Therapy

## 2017-01-11 ENCOUNTER — Ambulatory Visit: Payer: Medicare Other | Admitting: Physical Therapy

## 2017-01-16 ENCOUNTER — Ambulatory Visit: Payer: Medicare Other | Admitting: Physical Therapy

## 2017-01-18 ENCOUNTER — Ambulatory Visit: Payer: Medicare Other | Admitting: Physical Therapy

## 2017-01-23 ENCOUNTER — Ambulatory Visit: Payer: Medicare Other | Admitting: Physical Therapy

## 2017-01-25 ENCOUNTER — Ambulatory Visit: Payer: Medicare Other | Admitting: Physical Therapy

## 2017-02-10 ENCOUNTER — Encounter: Payer: Self-pay | Admitting: Urology

## 2017-02-10 ENCOUNTER — Telehealth: Payer: Self-pay | Admitting: Oncology

## 2017-02-10 ENCOUNTER — Ambulatory Visit (INDEPENDENT_AMBULATORY_CARE_PROVIDER_SITE_OTHER): Payer: Medicare Other | Admitting: Urology

## 2017-02-10 VITALS — BP 123/73 | HR 69 | Ht 72.0 in | Wt 180.6 lb

## 2017-02-10 DIAGNOSIS — R3912 Poor urinary stream: Secondary | ICD-10-CM | POA: Diagnosis not present

## 2017-02-10 DIAGNOSIS — C61 Malignant neoplasm of prostate: Secondary | ICD-10-CM

## 2017-02-10 DIAGNOSIS — M8580 Other specified disorders of bone density and structure, unspecified site: Secondary | ICD-10-CM | POA: Diagnosis not present

## 2017-02-10 LAB — BLADDER SCAN AMB NON-IMAGING

## 2017-02-10 MED ORDER — LEUPROLIDE ACETATE (6 MONTH) 45 MG IM KIT
45.0000 mg | PACK | Freq: Once | INTRAMUSCULAR | Status: AC
  Administered 2017-02-10: 45 mg via INTRAMUSCULAR

## 2017-02-10 NOTE — Telephone Encounter (Signed)
Consult for Prostate Ca. Ref by Dr Erlene Quan, Caryl Pina. Notes in Epic.  Appt conf with patient.  New patient packet left at registration for patient to complete.
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
## 2017-02-10 NOTE — Telephone Encounter (Signed)
Daughter returned call and confirmed appt.  Daughter and patient was not aware that he was referred to an Materials engineer.

## 2017-02-10 NOTE — Telephone Encounter (Signed)
Left message on patient's daughter voicemail with appointment detail. Also confirmed appt with patient.

## 2017-02-12 LAB — PSA: Prostate Specific Ag, Serum: <0.1 ng/mL (ref 0.0–4.0)

## 2017-02-15 ENCOUNTER — Inpatient Hospital Stay: Payer: Medicare Other | Admitting: Oncology

## 2017-02-16 ENCOUNTER — Telehealth: Payer: Self-pay

## 2017-02-16 NOTE — Telephone Encounter (Signed)
-----   Message from Hollice Espy, MD sent at 02/15/2017  7:55 PM EDT ----- Please let patient's daughter know is PSA is excellent (undetectable).  Her cell is at the bottom of my note.    Hollice Espy, MD

## 2017-02-16 NOTE — Telephone Encounter (Signed)
Called patient. No answer. Left vmail per DPR.
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
## 2017-02-28 ENCOUNTER — Encounter: Payer: Self-pay | Admitting: Oncology

## 2017-02-28 ENCOUNTER — Inpatient Hospital Stay: Payer: Medicare Other | Attending: Oncology | Admitting: Oncology

## 2017-02-28 VITALS — BP 158/79 | HR 69 | Temp 98.0°F | Resp 18 | Wt 181.2 lb

## 2017-02-28 DIAGNOSIS — M8580 Other specified disorders of bone density and structure, unspecified site: Secondary | ICD-10-CM | POA: Diagnosis not present

## 2017-02-28 DIAGNOSIS — K449 Diaphragmatic hernia without obstruction or gangrene: Secondary | ICD-10-CM | POA: Diagnosis not present

## 2017-02-28 DIAGNOSIS — J45909 Unspecified asthma, uncomplicated: Secondary | ICD-10-CM | POA: Diagnosis not present

## 2017-02-28 DIAGNOSIS — I10 Essential (primary) hypertension: Secondary | ICD-10-CM | POA: Insufficient documentation

## 2017-02-28 DIAGNOSIS — E785 Hyperlipidemia, unspecified: Secondary | ICD-10-CM | POA: Insufficient documentation

## 2017-02-28 DIAGNOSIS — I481 Persistent atrial fibrillation: Secondary | ICD-10-CM | POA: Insufficient documentation

## 2017-02-28 DIAGNOSIS — I420 Dilated cardiomyopathy: Secondary | ICD-10-CM | POA: Insufficient documentation

## 2017-02-28 DIAGNOSIS — Z923 Personal history of irradiation: Secondary | ICD-10-CM | POA: Diagnosis not present

## 2017-02-28 DIAGNOSIS — I2510 Atherosclerotic heart disease of native coronary artery without angina pectoris: Secondary | ICD-10-CM | POA: Insufficient documentation

## 2017-02-28 DIAGNOSIS — C61 Malignant neoplasm of prostate: Secondary | ICD-10-CM | POA: Diagnosis not present

## 2017-02-28 DIAGNOSIS — Z7982 Long term (current) use of aspirin: Secondary | ICD-10-CM | POA: Diagnosis not present

## 2017-02-28 DIAGNOSIS — Z79899 Other long term (current) drug therapy: Secondary | ICD-10-CM | POA: Insufficient documentation

## 2017-02-28 DIAGNOSIS — Z951 Presence of aortocoronary bypass graft: Secondary | ICD-10-CM | POA: Insufficient documentation

## 2017-02-28 DIAGNOSIS — N400 Enlarged prostate without lower urinary tract symptoms: Secondary | ICD-10-CM | POA: Diagnosis not present

## 2017-02-28 DIAGNOSIS — F329 Major depressive disorder, single episode, unspecified: Secondary | ICD-10-CM | POA: Diagnosis not present

## 2017-02-28 DIAGNOSIS — C772 Secondary and unspecified malignant neoplasm of intra-abdominal lymph nodes: Secondary | ICD-10-CM

## 2017-02-28 NOTE — Progress Notes (Signed)
In today for information on bone density medication and whether to go forward with treatment.  Pt currently on lupron for prostate cancer.  Dr Erlene Quan is pt's urologist.

## 2017-03-03 ENCOUNTER — Telehealth: Payer: Self-pay | Admitting: *Deleted

## 2017-03-03 NOTE — Telephone Encounter (Signed)
Dr. Janese Banks had asked me to call pt to see if he would like to start prolia for his bones and I called him and he has some questions and wants to see Dr. Janese Banks one more time to get more info about it. I asked if he wanted to talk to dentist or questions for me but he wants to speak with her. I made him an appt for 11/5 at 11:30 and will send message to schedule it. Pt agreeable and knows where to come to [facility]

## 2017-03-06 ENCOUNTER — Inpatient Hospital Stay: Payer: Medicare Other | Attending: Oncology | Admitting: Oncology

## 2017-03-06 ENCOUNTER — Encounter: Payer: Self-pay | Admitting: Oncology

## 2017-03-06 VITALS — BP 164/78 | HR 59 | Temp 97.7°F | Resp 14 | Wt 180.6 lb

## 2017-03-06 DIAGNOSIS — M8580 Other specified disorders of bone density and structure, unspecified site: Secondary | ICD-10-CM | POA: Diagnosis not present

## 2017-03-06 DIAGNOSIS — F329 Major depressive disorder, single episode, unspecified: Secondary | ICD-10-CM | POA: Diagnosis not present

## 2017-03-06 DIAGNOSIS — E785 Hyperlipidemia, unspecified: Secondary | ICD-10-CM | POA: Diagnosis not present

## 2017-03-06 DIAGNOSIS — Z951 Presence of aortocoronary bypass graft: Secondary | ICD-10-CM | POA: Insufficient documentation

## 2017-03-06 DIAGNOSIS — N400 Enlarged prostate without lower urinary tract symptoms: Secondary | ICD-10-CM | POA: Insufficient documentation

## 2017-03-06 DIAGNOSIS — Z79899 Other long term (current) drug therapy: Secondary | ICD-10-CM | POA: Insufficient documentation

## 2017-03-06 DIAGNOSIS — K449 Diaphragmatic hernia without obstruction or gangrene: Secondary | ICD-10-CM | POA: Diagnosis not present

## 2017-03-06 DIAGNOSIS — Z8546 Personal history of malignant neoplasm of prostate: Secondary | ICD-10-CM | POA: Insufficient documentation

## 2017-03-06 DIAGNOSIS — I4891 Unspecified atrial fibrillation: Secondary | ICD-10-CM | POA: Diagnosis not present

## 2017-03-06 DIAGNOSIS — I2510 Atherosclerotic heart disease of native coronary artery without angina pectoris: Secondary | ICD-10-CM | POA: Diagnosis not present

## 2017-03-06 DIAGNOSIS — I429 Cardiomyopathy, unspecified: Secondary | ICD-10-CM | POA: Diagnosis not present

## 2017-03-06 DIAGNOSIS — M940 Chondrocostal junction syndrome [Tietze]: Secondary | ICD-10-CM | POA: Diagnosis not present

## 2017-03-06 DIAGNOSIS — I10 Essential (primary) hypertension: Secondary | ICD-10-CM | POA: Diagnosis not present
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
## 2017-03-06 NOTE — Progress Notes (Signed)
Patient here today for follow up and to discuss Prolia. He is accompanied by his daughter. He states that he feels well today and denies having any pain.

## 2017-03-10 ENCOUNTER — Other Ambulatory Visit: Payer: Self-pay | Admitting: *Deleted

## 2017-03-13 ENCOUNTER — Other Ambulatory Visit: Payer: Self-pay | Admitting: *Deleted

## 2017-03-13 ENCOUNTER — Other Ambulatory Visit: Payer: Self-pay | Admitting: Oncology

## 2017-03-13 DIAGNOSIS — M8580 Other specified disorders of bone density and structure, unspecified site: Secondary | ICD-10-CM | POA: Insufficient documentation

## 2017-03-13 DIAGNOSIS — M85859 Other specified disorders of bone density and structure, unspecified thigh: Secondary | ICD-10-CM

## 2017-03-15 ENCOUNTER — Other Ambulatory Visit: Payer: Self-pay | Admitting: *Deleted

## 2017-03-16 ENCOUNTER — Ambulatory Visit (INDEPENDENT_AMBULATORY_CARE_PROVIDER_SITE_OTHER): Payer: Medicare Other

## 2017-03-16 ENCOUNTER — Inpatient Hospital Stay: Payer: Medicare Other

## 2017-03-16 ENCOUNTER — Other Ambulatory Visit: Payer: Self-pay | Admitting: *Deleted

## 2017-03-16 VITALS — BP 138/64 | HR 60 | Temp 97.8°F | Ht 73.0 in | Wt 182.8 lb

## 2017-03-16 DIAGNOSIS — Z0000 Encounter for general adult medical examination without abnormal findings: Secondary | ICD-10-CM

## 2017-03-16 DIAGNOSIS — M8580 Other specified disorders of bone density and structure, unspecified site: Secondary | ICD-10-CM | POA: Diagnosis not present

## 2017-03-16 DIAGNOSIS — I10 Essential (primary) hypertension: Secondary | ICD-10-CM | POA: Diagnosis not present

## 2017-03-16 DIAGNOSIS — M85859 Other specified disorders of bone density and structure, unspecified thigh: Secondary | ICD-10-CM

## 2017-03-16 DIAGNOSIS — I429 Cardiomyopathy, unspecified: Secondary | ICD-10-CM | POA: Diagnosis not present

## 2017-03-16 DIAGNOSIS — E785 Hyperlipidemia, unspecified: Secondary | ICD-10-CM | POA: Diagnosis not present

## 2017-03-16 DIAGNOSIS — I2510 Atherosclerotic heart disease of native coronary artery without angina pectoris: Secondary | ICD-10-CM | POA: Diagnosis not present

## 2017-03-16 DIAGNOSIS — Z8546 Personal history of malignant neoplasm of prostate: Secondary | ICD-10-CM | POA: Diagnosis not present

## 2017-03-16 LAB — BASIC METABOLIC PANEL
Anion gap: 7 (ref 5–15)
BUN: 19 mg/dL (ref 6–20)
CO2: 25 mmol/L (ref 22–32)
Calcium: 9 mg/dL (ref 8.9–10.3)
Chloride: 99 mmol/L — ABNORMAL LOW (ref 101–111)
Creatinine, Ser: 1.25 mg/dL — ABNORMAL HIGH (ref 0.61–1.24)
GFR calc Af Amer: 59 mL/min — ABNORMAL LOW (ref >60)
GFR calc non Af Amer: 51 mL/min — ABNORMAL LOW (ref >60)
Glucose, Bld: 114 mg/dL — ABNORMAL HIGH (ref 65–99)
Potassium: 4.5 mmol/L (ref 3.5–5.1)
Sodium: 131 mmol/L — ABNORMAL LOW (ref 135–145)

## 2017-03-16 MED ORDER — DENOSUMAB 60 MG/ML ~~LOC~~ SOLN
60.0000 mg | SUBCUTANEOUS | Status: DC
  Administered 2017-03-16: 60 mg via SUBCUTANEOUS
  Filled 2017-03-16: qty 1

## 2017-03-16 NOTE — Patient Instructions (Addendum)
Charles Cook , Thank you for taking time to come for your Medicare Wellness Visit. I appreciate your ongoing commitment to your health goals. Please review the following plan we discussed and let me know if I can assist you in the future.   Screening recommendations/referrals: Colonoscopy: no longer required Recommended yearly ophthalmology/optometry visit for glaucoma screening and checkup Recommended yearly dental visit for hygiene and checkup  Vaccinations: Influenza vaccine: declined Pneumococcal vaccine: Prevnar 13 due Tdap vaccine: up to date Shingles vaccine: completed in 2013  Advanced directives: Please bring a copy of your POA (Power of [place]) and/or Living Will to your next appointment.   Conditions/risks identified: Recommend increasing water intake to 4 glasses a day.   Next appointment: None, advised pt to schedule a follow up with PCP.  Preventive Care 17 Years and Older, Male Preventive care refers to lifestyle choices and visits with your health care provider that can promote health and wellness. What does preventive care include?  A yearly physical exam. This is also called an annual well check.  Dental exams once or twice a year.  Routine eye exams. Ask your health care provider how often you should have your eyes checked.  Personal lifestyle choices, including:  Daily care of your teeth and gums.  Regular physical activity.  Eating a healthy diet.  Avoiding tobacco and drug use.  Limiting alcohol use.  Practicing safe sex.  Taking low doses of aspirin every day.  Taking vitamin and mineral supplements as recommended by your health care provider. What happens during an annual well check? The services and screenings done by your health care provider during your annual well check will depend on your age, overall health, lifestyle risk factors, and family history of disease. Counseling  Your health care provider may ask you questions about  your:  Alcohol use.  Tobacco use.  Drug use.  Emotional well-being.  Home and relationship well-being.  Sexual activity.  Eating habits.  History of falls.  Memory and ability to understand (cognition).  Work and work Statistician. Screening  You may have the following tests or measurements:  Height, weight, and BMI.  Blood pressure.  Lipid and cholesterol levels. These may be checked every 5 years, or more frequently if you are over 26 years old.  Skin check.  Lung cancer screening. You may have this screening every year starting at age 95 if you have a 30-pack-year history of smoking and currently smoke or have quit within the past 15 years.  Fecal occult blood test (FOBT) of the stool. You may have this test every year starting at age 53.  Flexible sigmoidoscopy or colonoscopy. You may have a sigmoidoscopy every 5 years or a colonoscopy every 10 years starting at age 94.  Prostate cancer screening. Recommendations will vary depending on your family history and other risks.  Hepatitis C blood test.  Hepatitis B blood test.  Sexually transmitted disease (STD) testing.  Diabetes screening. This is done by checking your blood sugar (glucose) after you have not eaten for a while (fasting). You may have this done every 1-3 years.  Abdominal aortic aneurysm (AAA) screening. You may need this if you are a current or former smoker.  Osteoporosis. You may be screened starting at age 2 if you are at high risk. Talk with your health care provider about your test results, treatment options, and if necessary, the need for more tests. Vaccines  Your health care provider may recommend certain vaccines, such as:  Influenza vaccine.  This is recommended every year.  Tetanus, diphtheria, and acellular pertussis (Tdap, Td) vaccine. You may need a Td booster every 10 years.  Zoster vaccine. You may need this after age 71.  Pneumococcal 13-valent conjugate (PCV13) vaccine.  One dose is recommended after age 3.  Pneumococcal polysaccharide (PPSV23) vaccine. One dose is recommended after age 31. Talk to your health care provider about which screenings and vaccines you need and how often you need them. This information is not intended to replace advice given to you by your health care provider. Make sure you discuss any questions you have with your health care provider. Document Released: 05/15/2015 Document Revised: 01/06/2016 Document Reviewed: 02/17/2015 Elsevier Interactive Patient Education  2017 [place] Prevention in the Home Falls can cause injuries. They can happen to people of all ages. There are many things you can do to make your home safe and to help prevent falls. What can I do on the outside of my home?  Regularly fix the edges of walkways and driveways and fix any cracks.  Remove anything that might make you trip as you walk through a door, such as a raised step or threshold.  Trim any bushes or trees on the path to your home.  Use bright outdoor lighting.  Clear any walking paths of anything that might make someone trip, such as rocks or tools.  Regularly check to see if handrails are loose or broken. Make sure that both sides of any steps have handrails.  Any raised decks and porches should have guardrails on the edges.  Have any leaves, snow, or ice cleared regularly.  Use sand or salt on walking paths during winter.  Clean up any spills in your garage right away. This includes oil or grease spills. What can I do in the bathroom?  Use night lights.  Install grab bars by the toilet and in the tub and shower. Do not use towel bars as grab bars.  Use non-skid mats or decals in the tub or shower.  If you need to sit down in the shower, use a plastic, non-slip stool.  Keep the floor dry. Clean up any water that spills on the floor as soon as it happens.  Remove soap buildup in the tub or shower regularly.  Attach bath  mats securely with double-sided non-slip rug tape.  Do not have throw rugs and other things on the floor that can make you trip. What can I do in the bedroom?  Use night lights.  Make sure that you have a light by your bed that is easy to reach.  Do not use any sheets or blankets that are too big for your bed. They should not hang down onto the floor.  Have a firm chair that has side arms. You can use this for support while you get dressed.  Do not have throw rugs and other things on the floor that can make you trip. What can I do in the kitchen?  Clean up any spills right away.  Avoid walking on wet floors.  Keep items that you use a lot in easy-to-reach places.  If you need to reach something above you, use a strong step stool that has a grab bar.  Keep electrical cords out of the way.  Do not use floor polish or wax that makes floors slippery. If you must use wax, use non-skid floor wax.  Do not have throw rugs and other things on the floor that can make  you trip. What can I do with my stairs?  Do not leave any items on the stairs.  Make sure that there are handrails on both sides of the stairs and use them. Fix handrails that are broken or loose. Make sure that handrails are as long as the stairways.  Check any carpeting to make sure that it is firmly attached to the stairs. Fix any carpet that is loose or worn.  Avoid having throw rugs at the top or bottom of the stairs. If you do have throw rugs, attach them to the floor with carpet tape.  Make sure that you have a light switch at the top of the stairs and the bottom of the stairs. If you do not have them, ask someone to add them for you. What else can I do to help prevent falls?  Wear shoes that:  Do not have high heels.  Have rubber bottoms.  Are comfortable and fit you well.  Are closed at the toe. Do not wear sandals.  If you use a stepladder:  Make sure that it is fully opened. Do not climb a closed  stepladder.  Make sure that both sides of the stepladder are locked into place.  Ask someone to hold it for you, if possible.  Clearly mark and make sure that you can see:  Any grab bars or handrails.  First and last steps.  Where the edge of each step is.  Use tools that help you move around (mobility aids) if they are needed. These include:  Canes.  Walkers.  Scooters.  Crutches.  Turn on the lights when you go into a dark area. Replace any light bulbs as soon as they burn out.  Set up your furniture so you have a clear path. Avoid moving your furniture around.  If any of your floors are uneven, fix them.  If there are any pets around you, be aware of where they are.  Review your medicines with your doctor. Some medicines can make you feel dizzy. This can increase your chance of falling. Ask your doctor what other things that you can do to help prevent falls. This information is not intended to replace advice given to you by your health care provider. Make sure you discuss any questions you have with your health care provider. Document Released: 02/12/2009 Document Revised: 09/24/2015 Document Reviewed: 05/23/2014 Elsevier Interactive Patient Education  2017 [employer].
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
## 2017-03-16 NOTE — Progress Notes (Unsigned)
me

## 2017-03-20 ENCOUNTER — Ambulatory Visit: Payer: Medicare Other | Admitting: Family Medicine

## 2017-03-20 NOTE — Progress Notes (Deleted)
Patient: Charles Cook Male    DOB: [birth year]   84 y.o.   MRN: [id number] Visit Date: 03/20/2017  Today's Provider: Vernie Murders, PA   No chief complaint on file.  Subjective:    HPI Patient is here today to follow up from [facility] on 03/16/2017. Patient reports feeling well.      Hypertension, follow-up:  BP Readings from Last 3 Encounters:  03/16/17 138/64  03/06/17 (!) 164/78  02/28/17 (!) 158/79    He was last seen for hypertension 1 years ago.  BP at that visit was 124/64. Management changes since that visit include continue medication. He reports good compliance with treatment. He is not having side effects.  He {is/is not:9024} exercising. He {is/is not:9024} adherent to low salt diet.   Outside blood pressures are ***. He is experiencing none.  Patient denies chest pain, chest pressure/discomfort, claudication, dyspnea, exertional chest pressure/discomfort, fatigue, irregular heart beat, lower extremity edema, near-syncope, orthopnea, palpitations, paroxysmal nocturnal dyspnea, syncope and tachypnea.   Cardiovascular risk factors include advanced age (older than 10 for men, 30 for women), dyslipidemia, hypertension and male gender.  Use of agents associated with hypertension: none.     Weight trend: stable Wt Readings from Last 3 Encounters:  03/16/17 182 lb 12.8 oz (82.9 kg)  03/06/17 180 lb 8.9 oz (81.9 kg)  02/28/17 181 lb 4 oz (82.2 kg)   ------------------------------------------------------------------------   Allergies  Allergen Reactions  . Moxifloxacin Hcl In Nacl Other (See Comments)    Reaction:  Confusion      Current Outpatient Medications:  .  amiodarone (PACERONE) 200 MG tablet, Take 200 mg by mouth daily. , Disp: , Rfl:  .  apixaban (ELIQUIS) 2.5 MG TABS tablet, Take 1 tablet (2.5 mg total) by mouth 2 (two) times daily., Disp: 60 tablet, Rfl: 1 .  aspirin EC 81 MG tablet, Take 81 mg by mouth at bedtime., Disp: , Rfl:  .   atorvastatin (LIPITOR) 80 MG tablet, Take 80 mg by mouth at bedtime., Disp: , Rfl:  .  Calcium Carbonate-Vitamin D (CALCIUM-D) 600-400 MG-UNIT TABS, Take 1 tablet by mouth daily., Disp: , Rfl:  .  Cyanocobalamin (VITAMIN B-12) 5000 MCG SUBL, Place 5,000 mcg under the tongue daily., Disp: , Rfl:  .  digoxin (DIGOX) 0.25 MG tablet, Take 0.125 mg by mouth daily. , Disp: , Rfl:  .  diphenhydrAMINE (BENADRYL) 25 MG tablet, Take 25 mg by mouth at bedtime as needed for allergies. Reported on 10/27/2015, Disp: , Rfl:  .  diphenhydramine-acetaminophen (TYLENOL PM) 25-500 MG TABS tablet, Take 1 tablet by mouth at bedtime as needed., Disp: , Rfl:  .  fluticasone (FLONASE) 50 MCG/ACT nasal spray, Place 2 sprays into the nose daily as needed for rhinitis. Reported on 10/27/2015, Disp: , Rfl:  .  Leuprolide Acetate, 6 Month, (LUPRON DEPOT, 101-MONTH, IM), Inject into the muscle every 6 (six) months., Disp: , Rfl:  .  metoprolol succinate (TOPROL-XL) 25 MG 24 hr tablet, Take 1 tablet by mouth 2 (two) times daily., Disp: , Rfl:  .  Multiple Vitamin (MULTIVITAMIN WITH MINERALS) TABS tablet, Take 1 tablet by mouth daily., Disp: , Rfl:  .  niacin (NIASPAN) 500 MG CR tablet, TAKE ONE TABLET BY MOUTH ONCE DAILY, Disp: 90 tablet, Rfl: 3 .  sertraline (ZOLOFT) 50 MG tablet, Take 1 tablet by mouth daily., Disp: , Rfl:   Review of Systems  Constitutional: Negative.   Respiratory: Negative.   Cardiovascular: Negative.  Social History   Tobacco Use  . Smoking status: Never Smoker  . Smokeless tobacco: Never Used  Substance Use Topics  . Alcohol use: Yes    Alcohol/week: 0.0 oz    Comment: one glass in 6 months    Objective:   There were no vitals taken for this visit.   Physical Exam      Assessment & Plan:           Vernie Murders, PA  [facility]

## 2017-03-21 ENCOUNTER — Ambulatory Visit: Payer: Medicare Other | Admitting: Podiatry

## 2017-04-26 ENCOUNTER — Telehealth: Payer: Self-pay | Admitting: Family Medicine

## 2017-04-26 ENCOUNTER — Emergency Department: Payer: Medicare Other

## 2017-04-26 ENCOUNTER — Emergency Department
Admission: EM | Admit: 2017-04-26 | Discharge: 2017-04-26 | Disposition: A | Discharge status: Home / Self Care | Payer: Medicare Other | Attending: Emergency Medicine | Admitting: Emergency Medicine

## 2017-04-26 ENCOUNTER — Other Ambulatory Visit: Payer: Self-pay

## 2017-04-26 DIAGNOSIS — S2241XA Multiple fractures of ribs, right side, initial encounter for closed fracture: Secondary | ICD-10-CM | POA: Insufficient documentation

## 2017-04-26 DIAGNOSIS — Z8546 Personal history of malignant neoplasm of prostate: Secondary | ICD-10-CM | POA: Insufficient documentation

## 2017-04-26 DIAGNOSIS — Y999 Unspecified external cause status: Secondary | ICD-10-CM | POA: Diagnosis not present

## 2017-04-26 DIAGNOSIS — Z7901 Long term (current) use of anticoagulants: Secondary | ICD-10-CM | POA: Insufficient documentation

## 2017-04-26 DIAGNOSIS — S299XXA Unspecified injury of thorax, initial encounter: Secondary | ICD-10-CM | POA: Diagnosis not present

## 2017-04-26 DIAGNOSIS — Z8551 Personal history of malignant neoplasm of bladder: Secondary | ICD-10-CM | POA: Insufficient documentation

## 2017-04-26 DIAGNOSIS — I10 Essential (primary) hypertension: Secondary | ICD-10-CM | POA: Insufficient documentation

## 2017-04-26 DIAGNOSIS — Y939 Activity, unspecified: Secondary | ICD-10-CM | POA: Insufficient documentation

## 2017-04-26 DIAGNOSIS — Z7982 Long term (current) use of aspirin: Secondary | ICD-10-CM | POA: Insufficient documentation

## 2017-04-26 DIAGNOSIS — W01198A Fall on same level from slipping, tripping and stumbling with subsequent striking against other object, initial encounter: Secondary | ICD-10-CM | POA: Diagnosis not present

## 2017-04-26 DIAGNOSIS — Z951 Presence of aortocoronary bypass graft: Secondary | ICD-10-CM | POA: Diagnosis not present

## 2017-04-26 DIAGNOSIS — Y929 Unspecified place or not applicable: Secondary | ICD-10-CM | POA: Diagnosis not present

## 2017-04-26 DIAGNOSIS — I2510 Atherosclerotic heart disease of native coronary artery without angina pectoris: Secondary | ICD-10-CM | POA: Diagnosis not present

## 2017-04-26 DIAGNOSIS — Z79899 Other long term (current) drug therapy: Secondary | ICD-10-CM | POA: Diagnosis not present

## 2017-04-26 MED ORDER — OXYCODONE HCL 5 MG PO TABS: 5.0000 mg | ORAL_TABLET | Freq: Three times a day (TID) | ORAL | 0 refills | Status: DC | PRN

## 2017-04-26 NOTE — Telephone Encounter (Signed)
Pt requesting an x-ray order be sent over to the hospital.  Pt is in ED currently due to a fall a few days ago and was told by ED staff if an order was sent over they would be able to see him sooner.    Pt requesting a call back once the order is place.

## 2017-04-26 NOTE — Telephone Encounter (Signed)
Please review. Thanks!  

## 2017-04-26 NOTE — ED Triage Notes (Signed)
Pt states he slipped and fell Saturday and injury his right ribs. Pt denies any SOB. States he just wants to make sure there isnt a displaced fx that will not heal properly.
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
## 2017-04-26 NOTE — ED Notes (Signed)
Pt fell Saturday and since the fall he has been unable to sleep due to pain in right side

## 2017-05-04 ENCOUNTER — Ambulatory Visit (INDEPENDENT_AMBULATORY_CARE_PROVIDER_SITE_OTHER): Payer: Medicare Other | Admitting: Physician Assistant

## 2017-05-04 ENCOUNTER — Encounter: Payer: Self-pay | Admitting: Physician Assistant

## 2017-05-04 VITALS — BP 140/90 | HR 58 | Temp 97.9°F | Resp 16 | Ht 72.0 in | Wt 181.2 lb

## 2017-05-04 DIAGNOSIS — S2020XD Contusion of thorax, unspecified, subsequent encounter: Secondary | ICD-10-CM | POA: Diagnosis not present

## 2017-05-04 DIAGNOSIS — R630 Anorexia: Secondary | ICD-10-CM | POA: Diagnosis not present

## 2017-05-04 DIAGNOSIS — S2241XD Multiple fractures of ribs, right side, subsequent encounter for fracture with routine healing: Secondary | ICD-10-CM

## 2017-05-04 NOTE — Progress Notes (Signed)
Patient: Charles Cook Male    DOB: October 28, 1929   84 y.o.   MRN: [id number] Visit Date: 05/04/2017  Today's Provider: Mar Daring, PA-C   Chief Complaint  Patient presents with  . Follow-up   Subjective:    HPI Patient here today to follow up on fall that occurred on 04/23/2017. Per ER report he fell against a hard object on his right side and had a significant bruise there. Patient was seen at [facility] ER on 04/26/17 due to some discomfort primarily when sleeping if he lies on his right side. RADIOLOGY  Chest x-ray shows right-sided rib fractures, questionable old per radiology however this is where the patient's bruising is.   FINAL CLINICAL IMPRESSION(S) / ED DIAGNOSES Patient was started on oxycodone. Final diagnoses:  Closed fracture of multiple ribs of right side, initial encounter   Patient reports that he has been feeling very tired and poor appetite. Patient reports that he usually takes coffee for breakfast and one meal a day. Patient's daughter is concerned about him being very weak and just not feeling well.      Allergies  Allergen Reactions  . Moxifloxacin Hcl In Nacl Other (See Comments)    Reaction:  Confusion      Current Outpatient Medications:  .  amiodarone (PACERONE) 200 MG tablet, Take 200 mg by mouth daily. , Disp: , Rfl:  .  apixaban (ELIQUIS) 2.5 MG TABS tablet, Take 1 tablet (2.5 mg total) by mouth 2 (two) times daily., Disp: 60 tablet, Rfl: 1 .  aspirin EC 81 MG tablet, Take 81 mg by mouth at bedtime., Disp: , Rfl:  .  atorvastatin (LIPITOR) 80 MG tablet, Take 80 mg by mouth at bedtime., Disp: , Rfl:  .  Calcium Carbonate-Vitamin D (CALCIUM-D) 600-400 MG-UNIT TABS, Take 1 tablet by mouth daily., Disp: , Rfl:  .  Cyanocobalamin (VITAMIN B-12) 5000 MCG SUBL, Place 5,000 mcg under the tongue daily., Disp: , Rfl:  .  digoxin (DIGOX) 0.25 MG tablet, Take 0.125 mg by mouth daily. , Disp: , Rfl:  .  diphenhydrAMINE (BENADRYL) 25 MG tablet, Take  25 mg by mouth at bedtime as needed for allergies. Reported on 10/27/2015, Disp: , Rfl:  .  diphenhydramine-acetaminophen (TYLENOL PM) 25-500 MG TABS tablet, Take 1 tablet by mouth at bedtime as needed., Disp: , Rfl:  .  fluticasone (FLONASE) 50 MCG/ACT nasal spray, Place 2 sprays into the nose daily as needed for rhinitis. Reported on 10/27/2015, Disp: , Rfl:  .  Leuprolide Acetate, 6 Month, (LUPRON DEPOT, 72-MONTH, IM), Inject into the muscle every 6 (six) months., Disp: , Rfl:  .  metoprolol succinate (TOPROL-XL) 25 MG 24 hr tablet, Take 1 tablet by mouth 2 (two) times daily., Disp: , Rfl:  .  Multiple Vitamin (MULTIVITAMIN WITH MINERALS) TABS tablet, Take 1 tablet by mouth daily., Disp: , Rfl:  .  niacin (NIASPAN) 500 MG CR tablet, TAKE ONE TABLET BY MOUTH ONCE DAILY, Disp: 90 tablet, Rfl: 3 .  oxyCODONE (ROXICODONE) 5 MG immediate release tablet, Take 1 tablet (5 mg total) by mouth every 8 (eight) hours as needed for severe pain., Disp: 20 tablet, Rfl: 0 .  sertraline (ZOLOFT) 50 MG tablet, Take 1 tablet by mouth daily., Disp: , Rfl:   Review of Systems  Constitutional: Positive for activity change and appetite change.  Respiratory: Negative.   Cardiovascular: Negative.   Gastrointestinal: Positive for nausea.  Neurological: Positive for weakness.  Social History   Tobacco Use  . Smoking status: Never Smoker  . Smokeless tobacco: Never Used  Substance Use Topics  . Alcohol use: Yes    Alcohol/week: 0.0 oz    Comment: one glass in 6 months    Objective:   BP 140/90 (BP Location: Left Arm, Patient Position: Sitting, Cuff Size: Large)   Pulse (!) 58   Temp 97.9 F (36.6 C) (Oral)   Resp 16   Ht 6' (1.829 m)   Wt 181 lb 3.2 oz (82.2 kg)   SpO2 97%   BMI 24.58 kg/m  Vitals:   05/04/17 0956  BP: 140/90  Pulse: (!) 58  Resp: 16  Temp: 97.9 F (36.6 C)  TempSrc: Oral  SpO2: 97%  Weight: 181 lb 3.2 oz (82.2 kg)  Height: 6' (1.829 m)     Physical Exam  Constitutional:  He appears well-developed and well-nourished. No distress.  HENT:  Head: Normocephalic and atraumatic.  Neck: Normal range of motion. Neck supple. No JVD present. No tracheal deviation present. No thyromegaly present.  Cardiovascular: Normal rate, regular rhythm and normal heart sounds. Exam reveals no gallop and no friction rub.  No murmur heard. Pulmonary/Chest: Effort normal and breath sounds normal. No respiratory distress. He has no decreased breath sounds. He has no wheezes. He has no rales.     He exhibits no tenderness, no bony tenderness and no deformity.  Lymphadenopathy:    He has no cervical adenopathy.  Skin: He is not diaphoretic.  Vitals reviewed.     Assessment & Plan:     1. Closed fracture of multiple ribs of right side with routine healing, subsequent encounter Appears to be healing well. No more pain. Patient is doing deep breathing exercises daily and using flutter valve. Call if SOB develops.  2. Traumatic ecchymosis of chest, subsequent encounter Improving. Advised may apply heating pad to help reabsorb.   3. Decreased appetite Suspect secondary to nausea from oxycodone most likely. Advised to stay with bland foods for now until feeling better then slowly increase diet as tolerated. If patient has continued decreased appetite may consider adding a low dose mirtazapine.       Mar Daring, PA-C  [facility]

## 2017-05-24 ENCOUNTER — Telehealth: Payer: Self-pay | Admitting: Oncology

## 2017-05-24 NOTE — Telephone Encounter (Signed)
Rschd per Provider on PAL.  Appt schd and conf with patient to see Dr Tasia Catchings on 09/14/17. Updated appt mailed to patient.  Lab/MD/Prolia Inj, Per 03/13/17 schd/msg/Doni. (patient of Dr Janese Banks)

## 2017-06-05 DIAGNOSIS — I10 Essential (primary) hypertension: Secondary | ICD-10-CM | POA: Diagnosis not present

## 2017-06-05 DIAGNOSIS — I25810 Atherosclerosis of coronary artery bypass graft(s) without angina pectoris: Secondary | ICD-10-CM | POA: Diagnosis not present

## 2017-06-05 DIAGNOSIS — I420 Dilated cardiomyopathy: Secondary | ICD-10-CM | POA: Diagnosis not present

## 2017-06-05 DIAGNOSIS — I481 Persistent atrial fibrillation: Secondary | ICD-10-CM | POA: Diagnosis not present

## 2017-06-05 DIAGNOSIS — E782 Mixed hyperlipidemia: Secondary | ICD-10-CM | POA: Diagnosis not present

## 2017-06-16 ENCOUNTER — Other Ambulatory Visit: Payer: Self-pay

## 2017-06-16 ENCOUNTER — Ambulatory Visit (INDEPENDENT_AMBULATORY_CARE_PROVIDER_SITE_OTHER): Payer: Medicare Other | Admitting: Physician Assistant

## 2017-06-16 ENCOUNTER — Encounter: Payer: Self-pay | Admitting: Physician Assistant

## 2017-06-16 ENCOUNTER — Emergency Department: Payer: Medicare Other

## 2017-06-16 VITALS — BP 162/68 | HR 62 | Temp 98.2°F | Resp 14

## 2017-06-16 DIAGNOSIS — I10 Essential (primary) hypertension: Secondary | ICD-10-CM | POA: Diagnosis not present

## 2017-06-16 DIAGNOSIS — I2510 Atherosclerotic heart disease of native coronary artery without angina pectoris: Secondary | ICD-10-CM | POA: Insufficient documentation

## 2017-06-16 DIAGNOSIS — H6123 Impacted cerumen, bilateral: Secondary | ICD-10-CM | POA: Diagnosis not present

## 2017-06-16 DIAGNOSIS — Z7901 Long term (current) use of anticoagulants: Secondary | ICD-10-CM | POA: Insufficient documentation

## 2017-06-16 DIAGNOSIS — R070 Pain in throat: Secondary | ICD-10-CM | POA: Insufficient documentation

## 2017-06-16 DIAGNOSIS — R531 Weakness: Secondary | ICD-10-CM | POA: Diagnosis not present

## 2017-06-16 DIAGNOSIS — R2689 Other abnormalities of gait and mobility: Secondary | ICD-10-CM | POA: Diagnosis not present

## 2017-06-16 DIAGNOSIS — R05 Cough: Secondary | ICD-10-CM

## 2017-06-16 DIAGNOSIS — Z951 Presence of aortocoronary bypass graft: Secondary | ICD-10-CM | POA: Diagnosis not present

## 2017-06-16 DIAGNOSIS — R269 Unspecified abnormalities of gait and mobility: Secondary | ICD-10-CM | POA: Diagnosis not present

## 2017-06-16 DIAGNOSIS — J189 Pneumonia, unspecified organism: Secondary | ICD-10-CM | POA: Diagnosis not present

## 2017-06-16 DIAGNOSIS — Z8551 Personal history of malignant neoplasm of bladder: Secondary | ICD-10-CM | POA: Insufficient documentation

## 2017-06-16 DIAGNOSIS — Z8546 Personal history of malignant neoplasm of prostate: Secondary | ICD-10-CM | POA: Insufficient documentation

## 2017-06-16 DIAGNOSIS — Z7982 Long term (current) use of aspirin: Secondary | ICD-10-CM | POA: Insufficient documentation

## 2017-06-16 DIAGNOSIS — R059 Cough, unspecified: Secondary | ICD-10-CM

## 2017-06-16 DIAGNOSIS — R51 Headache: Secondary | ICD-10-CM | POA: Diagnosis not present

## 2017-06-16 DIAGNOSIS — R4781 Slurred speech: Secondary | ICD-10-CM | POA: Diagnosis not present

## 2017-06-16 DIAGNOSIS — R6889 Other general symptoms and signs: Secondary | ICD-10-CM

## 2017-06-16 DIAGNOSIS — Z79899 Other long term (current) drug therapy: Secondary | ICD-10-CM | POA: Insufficient documentation

## 2017-06-16 DIAGNOSIS — R471 Dysarthria and anarthria: Secondary | ICD-10-CM | POA: Diagnosis not present

## 2017-06-16 DIAGNOSIS — E860 Dehydration: Secondary | ICD-10-CM | POA: Diagnosis not present

## 2017-06-16 LAB — CBC
HCT: 38.4 % — ABNORMAL LOW (ref 40.0–52.0)
Hemoglobin: 13 g/dL (ref 13.0–18.0)
MCH: 33 pg (ref 26.0–34.0)
MCHC: 33.9 g/dL (ref 32.0–36.0)
MCV: 97.2 fL (ref 80.0–100.0)
Platelets: 214 10*3/uL (ref 150–440)
RBC: 3.95 MIL/uL — ABNORMAL LOW (ref 4.40–5.90)
RDW: 14 % (ref 11.5–14.5)
WBC: 9 10*3/uL (ref 3.8–10.6)

## 2017-06-16 LAB — BASIC METABOLIC PANEL
Anion gap: 7 (ref 5–15)
BUN: 17 mg/dL (ref 6–20)
CO2: 27 mmol/L (ref 22–32)
Calcium: 9 mg/dL (ref 8.9–10.3)
Chloride: 95 mmol/L — ABNORMAL LOW (ref 101–111)
Creatinine, Ser: 1.18 mg/dL (ref 0.61–1.24)
GFR calc Af Amer: >60 mL/min (ref >60)
GFR calc non Af Amer: 54 mL/min — ABNORMAL LOW (ref >60)
Glucose, Bld: 117 mg/dL — ABNORMAL HIGH (ref 65–99)
Potassium: 4.9 mmol/L (ref 3.5–5.1)
Sodium: 129 mmol/L — ABNORMAL LOW (ref 135–145)

## 2017-06-16 LAB — POCT INFLUENZA A/B
Influenza A, POC: NEGATIVE
Influenza B, POC: NEGATIVE

## 2017-06-16 MED ORDER — BENZONATATE 100 MG PO CAPS: 100.0000 mg | ORAL_CAPSULE | Freq: Three times a day (TID) | ORAL | 0 refills | Status: DC | PRN

## 2017-06-16 NOTE — Patient Instructions (Signed)
Upper Respiratory Infection, Adult Most upper respiratory infections (URIs) are caused by a virus. A URI affects the nose, throat, and upper air passages. The most common type of URI is often called "the common cold." Follow these instructions at home:  Take medicines only as told by your doctor.  Gargle warm saltwater or take cough drops to comfort your throat as told by your doctor.  Use a warm mist humidifier or inhale steam from a shower to increase air moisture. This may make it easier to breathe.  Drink enough fluid to keep your pee (urine) clear or pale yellow.  Eat soups and other clear broths.  Have a healthy diet.  Rest as needed.  Go back to work when your fever is gone or your doctor says it is okay. ? You may need to stay home longer to avoid giving your URI to others. ? You can also wear a face mask and wash your hands often to prevent spread of the virus.  Use your inhaler more if you have asthma.  Do not use any tobacco products, including cigarettes, chewing tobacco, or electronic cigarettes. If you need help quitting, ask your doctor. Contact a doctor if:  You are getting worse, not better.  Your symptoms are not helped by medicine.  You have chills.  You are getting more short of breath.  You have brown or red mucus.  You have yellow or brown discharge from your nose.  You have pain in your face, especially when you bend forward.  You have a fever.  You have puffy (swollen) neck glands.  You have pain while swallowing.  You have white areas in the back of your throat. Get help right away if:  You have very bad or constant: ? Headache. ? Ear pain. ? Pain in your forehead, behind your eyes, and over your cheekbones (sinus pain). ? Chest pain.  You have long-lasting (chronic) lung disease and any of the following: ? Wheezing. ? Long-lasting cough. ? Coughing up blood. ? A change in your usual mucus.  You have a stiff neck.  You have  changes in your: ? Vision. ? Hearing. ? Thinking. ? Mood. This information is not intended to replace advice given to you by your health care provider. Make sure you discuss any questions you have with your health care provider. Document Released: 10/05/2007 Document Revised: 12/20/2015 Document Reviewed: 07/24/2013 Elsevier Interactive Patient Education  2018 [employer]  

## 2017-06-16 NOTE — ED Triage Notes (Signed)
Pt last known normal when he went to bed last night.

## 2017-06-16 NOTE — Progress Notes (Signed)
Patient: Charles Cook Male    DOB: [birth year]   84 y.o.   MRN: [id number] Visit Date: 06/16/2017  Today's Provider: Mar Daring, PA-C   Chief Complaint  Patient presents with  . Sore Throat   Subjective:    HPI Pt is here today for a sore throat and not feeling well. He reports that it started last night. He also has a slight cough. No congestion. Denies fevers or chills. Pt is also off balance today. He reports that he has been off balance for a couple of weeks but nothing like he is today. Denies dizziness, chest pain or shortness of breath. He denies any sinus trouble or ear problems. Does not seem to be confused at all.     Allergies  Allergen Reactions  . Moxifloxacin Hcl In Nacl Other (See Comments)    Reaction:  Confusion      Current Outpatient Medications:  .  amiodarone (PACERONE) 200 MG tablet, Take 200 mg by mouth daily. , Disp: , Rfl:  .  apixaban (ELIQUIS) 2.5 MG TABS tablet, Take 1 tablet (2.5 mg total) by mouth 2 (two) times daily., Disp: 60 tablet, Rfl: 1 .  aspirin EC 81 MG tablet, Take 81 mg by mouth at bedtime., Disp: , Rfl:  .  atorvastatin (LIPITOR) 80 MG tablet, Take 80 mg by mouth at bedtime., Disp: , Rfl:  .  Calcium Carbonate-Vitamin D (CALCIUM-D) 600-400 MG-UNIT TABS, Take 1 tablet by mouth daily., Disp: , Rfl:  .  Calcium-Magnesium-Vitamin D (CALCIUM 500 PO), Take by mouth., Disp: , Rfl:  .  Cyanocobalamin (VITAMIN B-12) 5000 MCG SUBL, Place 5,000 mcg under the tongue daily., Disp: , Rfl:  .  digoxin (DIGOX) 0.25 MG tablet, Take 0.125 mg by mouth daily. , Disp: , Rfl:  .  diphenhydrAMINE (BENADRYL) 25 MG tablet, Take 25 mg by mouth at bedtime as needed for allergies. Reported on 10/27/2015, Disp: , Rfl:  .  diphenhydramine-acetaminophen (TYLENOL PM) 25-500 MG TABS tablet, Take 1 tablet by mouth at bedtime as needed., Disp: , Rfl:  .  fluticasone (FLONASE) 50 MCG/ACT nasal spray, Place 2 sprays into the nose daily as needed for rhinitis.  Reported on 10/27/2015, Disp: , Rfl:  .  Leuprolide Acetate, 6 Month, (LUPRON DEPOT, 58-MONTH, IM), Inject into the muscle every 6 (six) months., Disp: , Rfl:  .  metoprolol succinate (TOPROL-XL) 25 MG 24 hr tablet, Take 1 tablet by mouth 2 (two) times daily., Disp: , Rfl:  .  Multiple Vitamin (MULTIVITAMIN WITH MINERALS) TABS tablet, Take 1 tablet by mouth daily., Disp: , Rfl:  .  niacin (NIASPAN) 500 MG CR tablet, TAKE ONE TABLET BY MOUTH ONCE DAILY, Disp: 90 tablet, Rfl: 3 .  sertraline (ZOLOFT) 50 MG tablet, Take 1 tablet by mouth daily., Disp: , Rfl:  .  oxyCODONE (ROXICODONE) 5 MG immediate release tablet, Take 1 tablet (5 mg total) by mouth every 8 (eight) hours as needed for severe pain. (Patient not taking: Reported on 05/04/2017), Disp: 20 tablet, Rfl: 0  Review of Systems  Constitutional: Positive for fatigue.  HENT: Positive for sore throat.   Eyes: Negative.   Respiratory: Negative.   Cardiovascular: Negative.   Gastrointestinal: Negative.   Endocrine: Negative.   Genitourinary: Negative.   Musculoskeletal: Positive for gait problem.  Skin: Negative.   Allergic/Immunologic: Negative.   Psychiatric/Behavioral: Negative.     Social History   Tobacco Use  . Smoking status: Never Smoker  .  Smokeless tobacco: Never Used  Substance Use Topics  . Alcohol use: Yes    Alcohol/week: 0.0 oz    Comment: one glass in 6 months    Objective:   BP (!) 162/68 (BP Location: Left Arm, Patient Position: Sitting, Cuff Size: Normal)   Pulse 62   Temp 98.2 F (36.8 C) (Oral)   Resp 14   SpO2 98%  Vitals:   06/16/17 1348  BP: (!) 162/68  Pulse: 62  Resp: 14  Temp: 98.2 F (36.8 C)  TempSrc: Oral  SpO2: 98%     Physical Exam  Constitutional: He is oriented to person, place, and time. He appears well-developed and well-nourished. No distress.  HENT:  Head: Normocephalic and atraumatic.  Right Ear: Hearing, tympanic membrane, external ear and ear canal normal. Tympanic membrane  is not erythematous and not bulging. No middle ear effusion.  Left Ear: Hearing, tympanic membrane, external ear and ear canal normal. Tympanic membrane is not erythematous and not bulging.  No middle ear effusion.  Nose: Mucosal edema and rhinorrhea present. Right sinus exhibits no maxillary sinus tenderness and no frontal sinus tenderness. Left sinus exhibits no maxillary sinus tenderness and no frontal sinus tenderness.  Mouth/Throat: Uvula is midline, oropharynx is clear and moist and mucous membranes are normal. No oropharyngeal exudate, posterior oropharyngeal edema or posterior oropharyngeal erythema.  Eyes: Conjunctivae and EOM are normal. Pupils are equal, round, and reactive to light. Right eye exhibits no discharge. Left eye exhibits no discharge.  Neck: Normal range of motion. Neck supple. No tracheal deviation present. No Brudzinski's sign and no Kernig's sign noted. No thyromegaly present.  Cardiovascular: Normal rate, regular rhythm and normal heart sounds. Exam reveals no gallop and no friction rub.  No murmur heard. Pulmonary/Chest: Effort normal and breath sounds normal. No stridor. No respiratory distress. He has no wheezes. He has no rales.  Lymphadenopathy:    He has no cervical adenopathy.  Neurological: He is alert and oriented to person, place, and time. He has normal strength. No cranial nerve deficit. Coordination and gait abnormal.  Skin: Skin is warm and dry. He is not diaphoretic.  Vitals reviewed.      Assessment & Plan:      1. Flu-like symptoms Flu testing negative. Treat symptomatically. Tessalon perles for cough. Stop zzzquil as it may be contributing to his balance issues. Push fluids. Will give tessalon perles for cough. Use salt water gargles and/or chloraseptic spray for sore throat. BP was slightly elevated as well today and off balance quite worse from baseline, just worsening today per son-in-law. Neuro exam grossly intact. States off balance sensation is  better since ears have been cleaned. I will see him back on Monday to recheck BP, make sure he is getting better from viral illness and check his balance. I did advise him to walk with a walker or cane this weekend until he feels stronger to try to prevent falls. He agrees. I will see him back Monday. Call if symptoms worsen.  - POCT Influenza A/B  2. Bilateral impacted cerumen Successfully lavaged.  - Ear Lavage  3. Cough See above medical treatment plan. - benzonatate (TESSALON) 100 MG capsule; Take 1 capsule (100 mg total) by mouth 3 (three) times daily as needed for cough.  Dispense: 30 capsule; Refill: 0  4. Balance problem See above medical treatment plan.       Mar Daring, PA-C  [facility]

## 2017-06-16 NOTE — ED Triage Notes (Addendum)
Pt arrives to ED via POV from home with c/o "balance problems" and states he's been having trouble getting "the right words out" over the "last several weeks". No facial droop, grips are equal and strong bilaterally, MAEW, speech is clear and comprehensible. Pt states "went I get up sometimes I think I have trouble moving my legs". Pt is alert to self, situation, but cannot state correct date.

## 2017-06-17 ENCOUNTER — Emergency Department: Payer: Medicare Other

## 2017-06-17 ENCOUNTER — Emergency Department
Admission: EM | Admit: 2017-06-17 | Discharge: 2017-06-17 | Disposition: A | Discharge status: Home / Self Care | Payer: Medicare Other | Attending: Emergency Medicine | Admitting: Emergency Medicine

## 2017-06-17 DIAGNOSIS — R05 Cough: Secondary | ICD-10-CM

## 2017-06-17 DIAGNOSIS — R2689 Other abnormalities of gait and mobility: Secondary | ICD-10-CM

## 2017-06-17 DIAGNOSIS — R059 Cough, unspecified: Secondary | ICD-10-CM

## 2017-06-17 DIAGNOSIS — J189 Pneumonia, unspecified organism: Secondary | ICD-10-CM | POA: Diagnosis not present

## 2017-06-17 DIAGNOSIS — R531 Weakness: Secondary | ICD-10-CM

## 2017-06-17 DIAGNOSIS — E860 Dehydration: Secondary | ICD-10-CM

## 2017-06-17 DIAGNOSIS — R269 Unspecified abnormalities of gait and mobility: Secondary | ICD-10-CM | POA: Diagnosis not present

## 2017-06-17 LAB — URINALYSIS, COMPLETE (UACMP) WITH MICROSCOPIC
Bacteria, UA: NONE SEEN
Bilirubin Urine: NEGATIVE
Glucose, UA: NEGATIVE mg/dL
Hgb urine dipstick: NEGATIVE
Ketones, ur: NEGATIVE mg/dL
Leukocytes, UA: NEGATIVE
Nitrite: NEGATIVE
Protein, ur: NEGATIVE mg/dL
Specific Gravity, Urine: 1.019 (ref 1.005–1.030)
Squamous Epithelial / LPF: NONE SEEN
pH: 6 (ref 5.0–8.0)

## 2017-06-17 MED ORDER — BENZONATATE 100 MG PO CAPS: 100.0000 mg | ORAL_CAPSULE | Freq: Four times a day (QID) | ORAL | 0 refills | Status: DC | PRN

## 2017-06-17 MED ORDER — SODIUM CHLORIDE 0.9 % IV BOLUS (SEPSIS)
1000.0000 mL | Freq: Once | INTRAVENOUS | Status: AC
  Administered 2017-06-17: 1000 mL via INTRAVENOUS

## 2017-06-17 NOTE — ED Notes (Signed)
Pt ambulated approx 100 ft with single assist and then back to room using wheelchair as support. Gait steady but rushed. Encouraged pt to slow his walking and not be so hurried. Daughter states he has always rushed

## 2017-06-17 NOTE — ED Notes (Signed)
Patient transported to MRI 

## 2017-06-17 NOTE — ED Notes (Signed)
Pt has been experiencing balance and ataxia during the past 2 weeks. Feels particularly weak in the mornings and sometimes has trouble standing. At present alert and oriented and appropriate

## 2017-06-17 NOTE — Discharge Instructions (Signed)
Your work up this evening is negative. You have some old strokes but no acute findings. Please follow up with your primary care physician. Please use a walker at home. Please return with any worsened conditions or worsening symptoms.
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
## 2017-06-19 ENCOUNTER — Ambulatory Visit: Payer: Self-pay | Admitting: Physician Assistant

## 2017-06-22 ENCOUNTER — Telehealth: Payer: Self-pay | Admitting: Physician Assistant

## 2017-06-22 ENCOUNTER — Ambulatory Visit (INDEPENDENT_AMBULATORY_CARE_PROVIDER_SITE_OTHER): Payer: Medicare Other | Admitting: Physician Assistant

## 2017-06-22 ENCOUNTER — Encounter: Payer: Self-pay | Admitting: Physician Assistant

## 2017-06-22 VITALS — BP 148/60 | HR 61 | Temp 97.9°F | Resp 16 | Wt 185.0 lb

## 2017-06-22 DIAGNOSIS — J189 Pneumonia, unspecified organism: Secondary | ICD-10-CM

## 2017-06-22 DIAGNOSIS — J181 Lobar pneumonia, unspecified organism: Secondary | ICD-10-CM

## 2017-06-22 MED ORDER — LEVOFLOXACIN 250 MG PO TABS: 250.0000 mg | ORAL_TABLET | Freq: Every day | ORAL | 0 refills | Status: DC

## 2017-06-22 MED ORDER — ALBUTEROL SULFATE HFA 108 (90 BASE) MCG/ACT IN AERS: 2.0000 | INHALATION_SPRAY | Freq: Four times a day (QID) | RESPIRATORY_TRACT | 0 refills | Status: DC | PRN

## 2017-06-22 MED ORDER — PREDNISONE 10 MG (21) PO TBPK: ORAL_TABLET | ORAL | 0 refills | Status: DC

## 2017-06-22 NOTE — Progress Notes (Signed)
Patient: Charles Cook Male    DOB: 1930/02/22   84 y.o.   MRN: [id number] Visit Date: 06/22/2017  Today's Provider: Mar Daring, PA-C   Chief Complaint  Patient presents with  . Follow-up   Subjective:    HPI  Follow Up ER Visit  Patient is here for ER follow up.  He was recently seen at [facility] for cough,weakness,balance problem and dehydration on 06/17/17. 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 Blood Work-Sodium was 129.             Patient does not have any pneumonia and his urinalysis was negative. He reports excellent compliance with treatment. He reports this condition is Unchanged. States off balance is somewhat improved but cough is becoming less frequent and less productive with worsening chest tightness sensation. Daughter is here today with patient and reports that this week patient is sleeping a little better. He does have diarrhea, wheezing a little.  ------------------------------------------------------------------------------------     Allergies  Allergen Reactions  . Moxifloxacin Hcl In Nacl Other (See Comments)    Reaction:  Confusion      Current Outpatient Medications:  .  amiodarone (PACERONE) 200 MG tablet, Take 200 mg by mouth daily. , Disp: , Rfl:  .  apixaban (ELIQUIS) 2.5 MG TABS tablet, Take 1 tablet (2.5 mg total) by mouth 2 (two) times daily., Disp: 60 tablet, Rfl: 1 .  aspirin EC 81 MG tablet, Take 81 mg by mouth at bedtime., Disp: , Rfl:  .  atorvastatin (LIPITOR) 80 MG tablet, Take 80 mg by mouth at bedtime., Disp: , Rfl:  .  benzonatate (TESSALON PERLES) 100 MG capsule, Take 1 capsule (100 mg total) by mouth every 6 (six) hours as needed for cough., Disp: 15 capsule, Rfl: 0 .  benzonatate (TESSALON) 100 MG capsule, Take 1 capsule (100 mg total) by mouth 3 (three) times daily as needed for cough., Disp: 30 capsule, Rfl: 0 .  Calcium  Carbonate-Vitamin D (CALCIUM-D) 600-400 MG-UNIT TABS, Take 1 tablet by mouth daily., Disp: , Rfl:  .  Calcium-Magnesium-Vitamin D (CALCIUM 500 PO), Take by mouth., Disp: , Rfl:  .  Cyanocobalamin (VITAMIN B-12) 5000 MCG SUBL, Place 5,000 mcg under the tongue daily., Disp: , Rfl:  .  digoxin (DIGOX) 0.25 MG tablet, Take 0.125 mg by mouth daily. , Disp: , Rfl:  .  diphenhydrAMINE (BENADRYL) 25 MG tablet, Take 25 mg by mouth at bedtime as needed for allergies. Reported on 10/27/2015, Disp: , Rfl:  .  diphenhydramine-acetaminophen (TYLENOL PM) 25-500 MG TABS tablet, Take 1 tablet by mouth at bedtime as needed., Disp: , Rfl:  .  fluticasone (FLONASE) 50 MCG/ACT nasal spray, Place 2 sprays into the nose daily as needed for rhinitis. Reported on 10/27/2015, Disp: , Rfl:  .  Leuprolide Acetate, 6 Month, (LUPRON DEPOT, 2-MONTH, IM), Inject into the muscle every 6 (six) months., Disp: , Rfl:  .  metoprolol succinate (TOPROL-XL) 25 MG 24 hr tablet, Take 1 tablet by mouth 2 (two) times daily., Disp: , Rfl:  .  Multiple Vitamin (MULTIVITAMIN WITH MINERALS) TABS tablet, Take 1 tablet by mouth daily., Disp: , Rfl:  .  niacin (NIASPAN) 500 MG CR tablet, TAKE ONE TABLET BY MOUTH ONCE DAILY, Disp: 90 tablet, Rfl: 3 .  oxyCODONE (ROXICODONE) 5 MG immediate release tablet, Take 1 tablet (5 mg total) by mouth every 8 (eight) hours  as needed for severe pain., Disp: 20 tablet, Rfl: 0 .  sertraline (ZOLOFT) 50 MG tablet, Take 1 tablet by mouth daily., Disp: , Rfl:   Review of Systems  Constitutional: Positive for fatigue. Negative for fever.  HENT: Positive for congestion and postnasal drip. Negative for ear pain, rhinorrhea, sinus pressure, sinus pain, sore throat and trouble swallowing.   Respiratory: Positive for cough, chest tightness and wheezing. Negative for shortness of breath.   Cardiovascular: Negative for chest pain, palpitations and leg swelling.  Gastrointestinal: Positive for diarrhea. Negative for abdominal  pain, blood in stool, constipation, nausea and vomiting.  Neurological: Positive for weakness. Negative for dizziness, light-headedness and headaches.  Psychiatric/Behavioral: Positive for sleep disturbance.    Social History   Tobacco Use  . Smoking status: Never Smoker  . Smokeless tobacco: Never Used  Substance Use Topics  . Alcohol use: Yes    Alcohol/week: 0.0 oz    Comment: one glass in 6 months    Objective:   BP (!) 148/60 (BP Location: Left Arm, Patient Position: Sitting, Cuff Size: Normal)   Pulse 61   Temp 97.9 F (36.6 C) (Oral)   Resp 16   Wt 185 lb (83.9 kg)   SpO2 95%   BMI 25.09 kg/m     Physical Exam  Constitutional: He appears well-developed and well-nourished. No distress.  HENT:  Head: Normocephalic and atraumatic.  Right Ear: Hearing, tympanic membrane, external ear and ear canal normal. Tympanic membrane is not erythematous and not bulging. No middle ear effusion.  Left Ear: Hearing, tympanic membrane, external ear and ear canal normal. Tympanic membrane is not erythematous and not bulging.  No middle ear effusion.  Nose: Mucosal edema and rhinorrhea present. Right sinus exhibits no maxillary sinus tenderness and no frontal sinus tenderness. Left sinus exhibits no maxillary sinus tenderness and no frontal sinus tenderness.  Mouth/Throat: Uvula is midline, oropharynx is clear and moist and mucous membranes are normal. No oropharyngeal exudate, posterior oropharyngeal edema or posterior oropharyngeal erythema.  Eyes: Conjunctivae and EOM are normal. Pupils are equal, round, and reactive to light. Right eye exhibits no discharge. Left eye exhibits no discharge.  Neck: Normal range of motion. Neck supple. No tracheal deviation present. No Brudzinski's sign and no Kernig's sign noted. No thyromegaly present.  Cardiovascular: Normal rate, regular rhythm and normal heart sounds. Exam reveals no gallop and no friction rub.  No murmur heard. Pulmonary/Chest:  Effort normal. No stridor. No respiratory distress. He has no wheezes. He has no rhonchi. He has rales in the right lower field.  Lymphadenopathy:    He has no cervical adenopathy.  Skin: Skin is warm and dry. He is not diaphoretic.  Vitals reviewed.       Assessment & Plan:     1. Pneumonia of right lower lobe due to infectious organism [facility]) Will treat with Levaquin as below. Albuterol and prednisone for opening airway. Continue tessalon perles for cough. Push fluids. Try to get plenty of rest. Call if symptoms worsen or fail to improve.  - predniSONE (STERAPRED UNI-PAK 21 TAB) 10 MG (21) TBPK tablet; 6 day taper; take as directed on package instructions  Dispense: 21 tablet; Refill: 0 - albuterol (PROVENTIL HFA;VENTOLIN HFA) 108 (90 Base) MCG/ACT inhaler; Inhale 2 puffs into the lungs every 6 (six) hours as needed for wheezing or shortness of breath.  Dispense: 1 Inhaler; Refill: 0 - levofloxacin (LEVAQUIN) 250 MG tablet; Take 1 tablet (250 mg total) by mouth daily.  Dispense: 7 tablet;  Refill: 0       Mar Daring, PA-C  [facility]

## 2017-06-22 NOTE — Patient Instructions (Signed)

## 2017-06-22 NOTE — Telephone Encounter (Signed)
Yes for short while.

## 2017-06-22 NOTE — Telephone Encounter (Signed)
Advised pharmacist. 

## 2017-06-22 NOTE — Telephone Encounter (Signed)
Is this ok?

## 2017-06-22 NOTE — Telephone Encounter (Signed)
Please call [facility] on [address] to verify that you do want Charles Cook on the Levaquin as prescribed. He is on Amiodorone and a flag is coming up with mixing this and Levaquin.

## 2017-08-07 ENCOUNTER — Other Ambulatory Visit: Payer: Self-pay | Admitting: Physician Assistant

## 2017-08-07 MED ORDER — DIGOXIN 250 MCG PO TABS: 0.1250 mg | ORAL_TABLET | Freq: Every day | ORAL | 1 refills | Status: DC

## 2017-08-07 NOTE — Telephone Encounter (Signed)
Patient needs refills on Digoxin 250 mg. Sent to [facility].

## 2017-08-15 ENCOUNTER — Ambulatory Visit: Payer: Medicare Other | Admitting: Urology

## 2017-08-16 ENCOUNTER — Encounter: Payer: Self-pay | Admitting: Urology

## 2017-08-16 ENCOUNTER — Ambulatory Visit (INDEPENDENT_AMBULATORY_CARE_PROVIDER_SITE_OTHER): Payer: Medicare Other | Admitting: Urology

## 2017-08-16 VITALS — BP 147/67 | HR 55 | Ht 72.0 in | Wt 177.0 lb

## 2017-08-16 DIAGNOSIS — N139 Obstructive and reflux uropathy, unspecified: Secondary | ICD-10-CM

## 2017-08-16 DIAGNOSIS — M8580 Other specified disorders of bone density and structure, unspecified site: Secondary | ICD-10-CM

## 2017-08-16 DIAGNOSIS — C61 Malignant neoplasm of prostate: Secondary | ICD-10-CM | POA: Diagnosis not present

## 2017-08-16 LAB — BLADDER SCAN AMB NON-IMAGING

## 2017-08-16 MED ORDER — LEUPROLIDE ACETATE (6 MONTH) 45 MG IM KIT
45.0000 mg | PACK | Freq: Once | INTRAMUSCULAR | Status: AC
  Administered 2017-08-16: 45 mg via INTRAMUSCULAR

## 2017-08-16 NOTE — Progress Notes (Signed)
Lupron IM Injection   Due to Prostate Cancer patient is present today for a Lupron Injection.  Medication: Lupron 6 month Dose: 45 mg  Location: left upper outer buttocks Lot: [id number] Exp: 12/17/2019  Patient tolerated well, no complications were noted  Performed by: Fonnie Jarvis, CMA  Follow up: 6 month
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
## 2017-08-17 ENCOUNTER — Telehealth: Payer: Self-pay

## 2017-08-17 LAB — PSA: Prostate Specific Ag, Serum: <0.1 ng/mL (ref 0.0–4.0)

## 2017-08-17 NOTE — Telephone Encounter (Signed)
Patient's daughter notified on vmail 

## 2017-08-17 NOTE — Telephone Encounter (Signed)
-----   Message from Hollice Espy, MD sent at 08/17/2017  7:57 AM EDT ----- PSA is excellent.  Please let Mr. Sheeler and his daughter know.    Hollice Espy, MD

## 2017-09-12 ENCOUNTER — Other Ambulatory Visit: Payer: Self-pay | Admitting: *Deleted

## 2017-09-12 ENCOUNTER — Telehealth: Payer: Self-pay

## 2017-09-12 ENCOUNTER — Encounter: Payer: Self-pay | Admitting: Oncology

## 2017-09-12 ENCOUNTER — Inpatient Hospital Stay: Payer: Medicare Other

## 2017-09-12 ENCOUNTER — Inpatient Hospital Stay (HOSPITAL_BASED_OUTPATIENT_CLINIC_OR_DEPARTMENT_OTHER): Payer: Medicare Other | Admitting: Oncology

## 2017-09-12 ENCOUNTER — Inpatient Hospital Stay: Payer: Medicare Other | Attending: Oncology

## 2017-09-12 VITALS — BP 177/69 | HR 54 | Temp 97.4°F | Resp 18 | Ht 72.0 in | Wt 188.7 lb

## 2017-09-12 DIAGNOSIS — C772 Secondary and unspecified malignant neoplasm of intra-abdominal lymph nodes: Secondary | ICD-10-CM | POA: Insufficient documentation

## 2017-09-12 DIAGNOSIS — M85859 Other specified disorders of bone density and structure, unspecified thigh: Secondary | ICD-10-CM

## 2017-09-12 DIAGNOSIS — M8588 Other specified disorders of bone density and structure, other site: Secondary | ICD-10-CM | POA: Diagnosis not present

## 2017-09-12 DIAGNOSIS — C61 Malignant neoplasm of prostate: Secondary | ICD-10-CM

## 2017-09-12 LAB — CBC WITH DIFFERENTIAL/PLATELET
Basophils Absolute: 0.1 10*3/uL (ref 0–0.1)
Basophils Relative: 1 %
Eosinophils Absolute: 0.7 10*3/uL (ref 0–0.7)
Eosinophils Relative: 9 %
HCT: 38.6 % — ABNORMAL LOW (ref 40.0–52.0)
Hemoglobin: 13.4 g/dL (ref 13.0–18.0)
Lymphocytes Relative: 14 %
Lymphs Abs: 1.2 10*3/uL (ref 1.0–3.6)
MCH: 34.2 pg — ABNORMAL HIGH (ref 26.0–34.0)
MCHC: 34.7 g/dL (ref 32.0–36.0)
MCV: 98.7 fL (ref 80.0–100.0)
Monocytes Absolute: 1 10*3/uL (ref 0.2–1.0)
Monocytes Relative: 12 %
Neutro Abs: 5.4 10*3/uL (ref 1.4–6.5)
Neutrophils Relative %: 64 %
Platelets: 243 10*3/uL (ref 150–440)
RBC: 3.91 MIL/uL — ABNORMAL LOW (ref 4.40–5.90)
RDW: 13.9 % (ref 11.5–14.5)
WBC: 8.4 10*3/uL (ref 3.8–10.6)

## 2017-09-12 LAB — COMPREHENSIVE METABOLIC PANEL
ALT: 36 U/L (ref 17–63)
AST: 46 U/L — ABNORMAL HIGH (ref 15–41)
Albumin: 4.2 g/dL (ref 3.5–5.0)
Alkaline Phosphatase: 55 U/L (ref 38–126)
Anion gap: 9 (ref 5–15)
BUN: 14 mg/dL (ref 6–20)
CO2: 23 mmol/L (ref 22–32)
Calcium: 9.2 mg/dL (ref 8.9–10.3)
Chloride: 95 mmol/L — ABNORMAL LOW (ref 101–111)
Creatinine, Ser: 1.13 mg/dL (ref 0.61–1.24)
GFR calc Af Amer: >60 mL/min (ref >60)
GFR calc non Af Amer: 57 mL/min — ABNORMAL LOW (ref >60)
Glucose, Bld: 132 mg/dL — ABNORMAL HIGH (ref 65–99)
Potassium: 4.9 mmol/L (ref 3.5–5.1)
Sodium: 127 mmol/L — ABNORMAL LOW (ref 135–145)
Total Bilirubin: 0.7 mg/dL (ref 0.3–1.2)
Total Protein: 7 g/dL (ref 6.5–8.1)

## 2017-09-12 LAB — PSA: Prostatic Specific Antigen: 0.01 ng/mL (ref 0.00–4.00)

## 2017-09-12 MED ORDER — DENOSUMAB 60 MG/ML ~~LOC~~ SOSY
60.0000 mg | PREFILLED_SYRINGE | Freq: Once | SUBCUTANEOUS | Status: AC
  Administered 2017-09-12: 60 mg via SUBCUTANEOUS
  Filled 2017-09-12: qty 1

## 2017-09-12 NOTE — Telephone Encounter (Signed)
Courtney from the cancer center called to let Charles Cook know that his sodium level is at 127. I advised her that patient has an appt scheduled tomorrow, and I will let Charles Cook know to discuss with the patient. Thanks!

## 2017-09-12 NOTE — Progress Notes (Unsigned)
Met c

## 2017-09-12 NOTE — Progress Notes (Signed)
No new changes noted today 

## 2017-09-12 NOTE — Telephone Encounter (Signed)
Per Dr Janese Banks request Spoke with Ms. Roshelle at Mr. Fot[facility] to inform NP that with blood work today the patient sodium level are low ( 127). Ms Burman [place] states the patient has appointment tomorrow due to low level of sodium. They have agreed to keep schedule appointment and address the low sodium levels with the patient tomorrow at his appointment

## 2017-09-12 NOTE — Telephone Encounter (Signed)
Please be advised.KW
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
## 2017-09-13 ENCOUNTER — Other Ambulatory Visit: Payer: Self-pay

## 2017-09-13 ENCOUNTER — Ambulatory Visit (INDEPENDENT_AMBULATORY_CARE_PROVIDER_SITE_OTHER): Payer: Medicare Other | Admitting: Physician Assistant

## 2017-09-13 VITALS — BP 148/78 | HR 64 | Temp 98.2°F

## 2017-09-13 DIAGNOSIS — E871 Hypo-osmolality and hyponatremia: Secondary | ICD-10-CM | POA: Diagnosis not present

## 2017-09-13 DIAGNOSIS — F5101 Primary insomnia: Secondary | ICD-10-CM

## 2017-09-13 MED ORDER — TRAZODONE HCL 100 MG PO TABS: 100.0000 mg | ORAL_TABLET | Freq: Every day | ORAL | 1 refills | Status: DC

## 2017-09-13 NOTE — Patient Instructions (Signed)
Hyponatremia Hyponatremia is when the amount of salt (sodium) in your blood is too low. When salt levels are low, your cells absorb extra water and they swell. The swelling happens throughout the body, but it mostly affects the brain. Follow these instructions at home:  Take medicines only as told by your doctor. Many medicines can make this condition worse. Talk with your doctor about any medicines that you are currently taking.  Carefully follow a recommended diet as told by your doctor.  Carefully follow instructions from your doctor about fluid restrictions.  Keep all follow-up visits as told by your doctor. This is important.  Do not drink alcohol. Contact a doctor if:  You feel sicker to your stomach (nauseous).  You feel more confused.  You feel more tired (fatigued).  Your headache gets worse.  You feel weaker.  Your symptoms go away and then they come back.  You have trouble following the diet instructions. Get help right away if:  You start to twitch and shake (have a seizure).  You pass out (faint).  You keep having watery poop (diarrhea).  You keep throwing up (vomiting). This information is not intended to replace advice given to you by your health care provider. Make sure you discuss any questions you have with your health care provider. Document Released: 12/29/2010 Document Revised: 09/24/2015 Document Reviewed: 04/14/2014 Elsevier Interactive Patient Education  2018 Elsevier Inc.  

## 2017-09-13 NOTE — Progress Notes (Signed)
Patient: Charles Cook Male    DOB: 08-29-1929   84 y.o.   MRN: [id number] Visit Date: 09/13/2017  Today's Provider: Mar Daring, PA-C   Chief Complaint  Patient presents with  . Insomnia   Subjective:    Insomnia  Primary symptoms: sleep disturbance, difficulty falling asleep, malaise/fatigue.  The current episode started one month. Types of beverages you drink: coffee. Past treatments include other (tylenol PM). Typical bedtime:  10-11 P.M..  How long after going to bed to you fall asleep: over an hour.   PMH includes: no apnea. Prior diagnostic workup includes:  No prior workup.   Was taking tylenol pm without issue but was told by Neuro not to take every night due to risk of dementia.    Allergies  Allergen Reactions  . Moxifloxacin Hcl In Nacl Other (See Comments)    Reaction:  Confusion      Current Outpatient Medications:  .  amiodarone (PACERONE) 200 MG tablet, Take 200 mg by mouth daily. , Disp: , Rfl:  .  apixaban (ELIQUIS) 2.5 MG TABS tablet, Take 1 tablet (2.5 mg total) by mouth 2 (two) times daily., Disp: 60 tablet, Rfl: 1 .  aspirin EC 81 MG tablet, Take 81 mg by mouth at bedtime., Disp: , Rfl:  .  atorvastatin (LIPITOR) 80 MG tablet, Take 80 mg by mouth at bedtime., Disp: , Rfl:  .  Calcium Carbonate-Vitamin D (CALCIUM-D) 600-400 MG-UNIT TABS, Take 1 tablet by mouth daily., Disp: , Rfl:  .  Calcium-Magnesium-Vitamin D (CALCIUM 500 PO), Take by mouth., Disp: , Rfl:  .  Cyanocobalamin (VITAMIN B-12) 5000 MCG SUBL, Place 5,000 mcg under the tongue daily., Disp: , Rfl:  .  digoxin (DIGOX) 0.25 MG tablet, Take 0.5 tablets (0.125 mg total) by mouth daily., Disp: 90 tablet, Rfl: 1 .  diphenhydrAMINE (BENADRYL) 25 MG tablet, Take 25 mg by mouth at bedtime as needed for allergies. Reported on 10/27/2015, Disp: , Rfl:  .  diphenhydramine-acetaminophen (TYLENOL PM) 25-500 MG TABS tablet, Take 1 tablet by mouth at bedtime as needed., Disp: , Rfl:  .  metoprolol  succinate (TOPROL-XL) 25 MG 24 hr tablet, Take 1 tablet by mouth 2 (two) times daily., Disp: , Rfl:  .  Multiple Vitamin (MULTIVITAMIN WITH MINERALS) TABS tablet, Take 1 tablet by mouth daily., Disp: , Rfl:  .  niacin (NIASPAN) 500 MG CR tablet, TAKE ONE TABLET BY MOUTH ONCE DAILY, Disp: 90 tablet, Rfl: 3 .  sertraline (ZOLOFT) 50 MG tablet, Take 1 tablet by mouth daily., Disp: , Rfl:  .  albuterol (PROVENTIL HFA;VENTOLIN HFA) 108 (90 Base) MCG/ACT inhaler, Inhale 2 puffs into the lungs every 6 (six) hours as needed for wheezing or shortness of breath. (Patient not taking: Reported on 09/12/2017), Disp: 1 Inhaler, Rfl: 0 .  fluticasone (FLONASE) 50 MCG/ACT nasal spray, Place 2 sprays into the nose daily as needed for rhinitis. Reported on 10/27/2015, Disp: , Rfl:  .  Leuprolide Acetate, 6 Month, (LUPRON DEPOT, 54-MONTH, IM), Inject into the muscle every 6 (six) months., Disp: , Rfl:   Review of Systems  Constitutional: Positive for fatigue and malaise/fatigue.  Respiratory: Negative for apnea.   Cardiovascular: Negative.   Musculoskeletal: Positive for gait problem.  Neurological: Positive for weakness.  Psychiatric/Behavioral: Positive for sleep disturbance. The patient has insomnia.     Social History   Tobacco Use  . Smoking status: Never Smoker  . Smokeless tobacco: Never Used  Substance Use  Topics  . Alcohol use: Yes    Alcohol/week: 0.0 oz    Comment: one glass in 6 months    Objective:   There were no vitals taken for this visit. There were no vitals filed for this visit.   Physical Exam  Constitutional: He is oriented to person, place, and time. He appears well-developed and well-nourished. No distress.  HENT:  Head: Normocephalic and atraumatic.  Neck: Normal range of motion. Neck supple.  Cardiovascular: Normal rate, regular rhythm and normal heart sounds. Exam reveals no gallop and no friction rub.  No murmur heard. Pulmonary/Chest: Effort normal and breath sounds  normal. No respiratory distress. He has no wheezes. He has no rales.  Neurological: He is alert and oriented to person, place, and time. No cranial nerve deficit.  Skin: He is not diaphoretic.  Vitals reviewed.      Assessment & Plan:     1. Primary insomnia Will try trazodone as below for insomnia. If he has trouble with this I feel he may be better benefited by using tylenol PM prn. Given his advanced age some of the other medications may be more risky and detrimental. I will see him back in 4 weeks to see if this is working.  - traZODone (DESYREL) 100 MG tablet; Take 1 tablet (100 mg total) by mouth at bedtime.  Dispense: 30 tablet; Refill: 1  2. Hyponatremia Advised to add a gatorade or pedialyte daily. Will check labs as below in about 2 weeks and f/u pending results.  - Basic Metabolic Panel (BMET); Future       Mar Daring, PA-C  [facility]

## 2017-09-14 ENCOUNTER — Other Ambulatory Visit: Payer: Medicare Other

## 2017-09-14 ENCOUNTER — Ambulatory Visit: Payer: Medicare Other | Admitting: Oncology

## 2017-09-14 ENCOUNTER — Ambulatory Visit: Payer: Medicare Other

## 2017-09-15 ENCOUNTER — Encounter: Payer: Self-pay | Admitting: Physician Assistant

## 2017-10-03 ENCOUNTER — Telehealth: Payer: Self-pay | Admitting: Physician Assistant

## 2017-10-03 ENCOUNTER — Other Ambulatory Visit: Payer: Self-pay

## 2017-10-03 DIAGNOSIS — E871 Hypo-osmolality and hyponatremia: Secondary | ICD-10-CM

## 2017-10-03 NOTE — Telephone Encounter (Signed)
Left patient a message to return my call for an appt. °

## 2017-10-04 ENCOUNTER — Ambulatory Visit (INDEPENDENT_AMBULATORY_CARE_PROVIDER_SITE_OTHER): Payer: Medicare Other | Admitting: Physician Assistant

## 2017-10-04 ENCOUNTER — Encounter: Payer: Self-pay | Admitting: Physician Assistant

## 2017-10-04 VITALS — BP 120/70 | HR 52 | Temp 98.1°F | Resp 20 | Ht 72.0 in | Wt 191.8 lb

## 2017-10-04 DIAGNOSIS — R5383 Other fatigue: Secondary | ICD-10-CM

## 2017-10-04 DIAGNOSIS — K5900 Constipation, unspecified: Secondary | ICD-10-CM

## 2017-10-04 DIAGNOSIS — R001 Bradycardia, unspecified: Secondary | ICD-10-CM | POA: Diagnosis not present

## 2017-10-04 LAB — BASIC METABOLIC PANEL
BUN/Creatinine Ratio: 10 (ref 10–24)
BUN: 12 mg/dL (ref 8–27)
CO2: 21 mmol/L (ref 20–29)
Calcium: 8.8 mg/dL (ref 8.6–10.2)
Chloride: 92 mmol/L — ABNORMAL LOW (ref 96–106)
Creatinine, Ser: 1.16 mg/dL (ref 0.76–1.27)
GFR calc Af Amer: 66 mL/min/{1.73_m2} (ref >59)
GFR calc non Af Amer: 57 mL/min/{1.73_m2} — ABNORMAL LOW (ref >59)
Glucose: 120 mg/dL — ABNORMAL HIGH (ref 65–99)
Potassium: 4.8 mmol/L (ref 3.5–5.2)
Sodium: 131 mmol/L — ABNORMAL LOW (ref 134–144)

## 2017-10-04 NOTE — Progress Notes (Signed)
Patient: Charles Cook Male    DOB: 19-Jan-1930   84 y.o.   MRN: [id number] Visit Date: 10/04/2017  Today's Provider: Mar Daring, PA-C   Chief Complaint  Patient presents with  . Follow-up   Subjective:    HPI  Follow up for insomnia  The patient was last seen for this 3 weeks ago. Changes made at last visit include start Trazodone.  He reports excellent compliance with treatment. He feels that condition is Improved. He is not having side effects.   ------------------------------------------------------------------------------------ Patient c/o "wet flatulence" in the last 2 months. He does report that BM are not as frequent and also reports they are hard. No hematochezia or melena. Intermittent nausea but none recently. Also continues to have fatigue despite sleeping better.      Allergies  Allergen Reactions  . Moxifloxacin Hcl In Nacl Other (See Comments)    Reaction:  Confusion      Current Outpatient Medications:  .  amiodarone (PACERONE) 200 MG tablet, Take 200 mg by mouth daily. , Disp: , Rfl:  .  apixaban (ELIQUIS) 2.5 MG TABS tablet, Take 1 tablet (2.5 mg total) by mouth 2 (two) times daily., Disp: 60 tablet, Rfl: 1 .  aspirin EC 81 MG tablet, Take 81 mg by mouth at bedtime., Disp: , Rfl:  .  atorvastatin (LIPITOR) 80 MG tablet, Take 80 mg by mouth at bedtime., Disp: , Rfl:  .  Calcium Carbonate-Vitamin D (CALCIUM-D) 600-400 MG-UNIT TABS, Take 1 tablet by mouth daily., Disp: , Rfl:  .  Calcium-Magnesium-Vitamin D (CALCIUM 500 PO), Take by mouth., Disp: , Rfl:  .  Cyanocobalamin (VITAMIN B-12) 5000 MCG SUBL, Place 5,000 mcg under the tongue daily., Disp: , Rfl:  .  digoxin (DIGOX) 0.25 MG tablet, Take 0.5 tablets (0.125 mg total) by mouth daily., Disp: 90 tablet, Rfl: 1 .  diphenhydrAMINE (BENADRYL) 25 MG tablet, Take 25 mg by mouth at bedtime as needed for allergies. Reported on 10/27/2015, Disp: , Rfl:  .  diphenhydramine-acetaminophen (TYLENOL PM)  25-500 MG TABS tablet, Take 1 tablet by mouth at bedtime as needed., Disp: , Rfl:  .  fluticasone (FLONASE) 50 MCG/ACT nasal spray, Place 2 sprays into the nose daily as needed for rhinitis. Reported on 10/27/2015, Disp: , Rfl:  .  Leuprolide Acetate, 6 Month, (LUPRON DEPOT, 28-MONTH, IM), Inject into the muscle every 6 (six) months., Disp: , Rfl:  .  metoprolol succinate (TOPROL-XL) 25 MG 24 hr tablet, Take 1 tablet by mouth 2 (two) times daily., Disp: , Rfl:  .  Multiple Vitamin (MULTIVITAMIN WITH MINERALS) TABS tablet, Take 1 tablet by mouth daily., Disp: , Rfl:  .  niacin (NIASPAN) 500 MG CR tablet, TAKE ONE TABLET BY MOUTH ONCE DAILY, Disp: 90 tablet, Rfl: 3 .  sertraline (ZOLOFT) 50 MG tablet, Take 1 tablet by mouth daily., Disp: , Rfl:  .  traZODone (DESYREL) 100 MG tablet, Take 1 tablet (100 mg total) by mouth at bedtime., Disp: 30 tablet, Rfl: 1 .  albuterol (PROVENTIL HFA;VENTOLIN HFA) 108 (90 Base) MCG/ACT inhaler, Inhale 2 puffs into the lungs every 6 (six) hours as needed for wheezing or shortness of breath. (Patient not taking: Reported on 09/12/2017), Disp: 1 Inhaler, Rfl: 0  Review of Systems  Constitutional: Negative.   Respiratory: Negative.   Cardiovascular: Negative.   Neurological: Negative.     Social History   Tobacco Use  . Smoking status: Never Smoker  . Smokeless tobacco: Never  Used  Substance Use Topics  . Alcohol use: Yes    Alcohol/week: 0.0 oz    Comment: one glass in 6 months    Objective:   BP 120/70 (BP Location: Right Arm, Patient Position: Sitting, Cuff Size: Normal)   Pulse (!) 52   Temp 98.1 F (36.7 C) (Oral)   Resp 20   Ht 6' (1.829 m)   Wt 191 lb 12.8 oz (87 kg)   SpO2 97%   BMI 26.01 kg/m  Vitals:   10/04/17 1108  BP: 120/70  Pulse: (!) 52  Resp: 20  Temp: 98.1 F (36.7 C)  TempSrc: Oral  SpO2: 97%  Weight: 191 lb 12.8 oz (87 kg)  Height: 6' (1.829 m)     Physical Exam  Constitutional: He appears well-developed and  well-nourished. No distress.  HENT:  Head: Normocephalic and atraumatic.  Neck: Normal range of motion. Neck supple. No JVD present. No tracheal deviation present. No thyromegaly present.  Cardiovascular: Regular rhythm and normal heart sounds. Bradycardia present. Exam reveals no gallop and no friction rub.  No murmur heard. Pulmonary/Chest: Effort normal and breath sounds normal. No respiratory distress. He has no wheezes. He has no rales.  Abdominal: Soft. Normal appearance and bowel sounds are normal. There is no tenderness.  Lymphadenopathy:    He has no cervical adenopathy.  Skin: He is not diaphoretic.  Vitals reviewed.      Assessment & Plan:     1. Bradycardia Bradycardia noted on exam today. EKG obtained and shows Sinus bradycardia with rate of 56 and RBBB. RBBB unchanged from previous EKG in 07/2017. New onset bradycardia. May need to decrease metoprolol to once daily. Daughter, [name], is going to call Dr. Ubaldo Glassing, patient's cardiologist, for recommendations. Push fluids. - EKG 12-Lead  2. Fatigue, unspecified type See above medical treatment plan. - EKG 12-Lead  3. Constipation, unspecified constipation type Suspect constipation as cause of wet flatulence. Discussed bowel health and why this happens with constipation. Discussed adding dietary fibers and a stool softener daily. Then when needed add Miralax if no BM or small, hard BM, over two days.        Mar Daring, PA-C  [facility]

## 2017-10-09 ENCOUNTER — Encounter: Payer: Self-pay | Admitting: Physician Assistant

## 2017-10-13 ENCOUNTER — Other Ambulatory Visit: Payer: Self-pay | Admitting: Physician Assistant

## 2017-10-13 DIAGNOSIS — F5101 Primary insomnia: Secondary | ICD-10-CM

## 2017-11-08 ENCOUNTER — Other Ambulatory Visit: Payer: Self-pay | Admitting: Family Medicine

## 2017-11-08 DIAGNOSIS — J301 Allergic rhinitis due to pollen: Secondary | ICD-10-CM

## 2017-11-12 ENCOUNTER — Emergency Department: Payer: Medicare Other

## 2017-11-12 ENCOUNTER — Inpatient Hospital Stay
Admission: EM | Admit: 2017-11-12 | Discharge: 2017-11-15 | DRG: 444 | Disposition: A | Discharge status: Home Health | Payer: Medicare Other | Attending: Internal Medicine | Admitting: Internal Medicine

## 2017-11-12 ENCOUNTER — Other Ambulatory Visit: Payer: Self-pay

## 2017-11-12 DIAGNOSIS — Z951 Presence of aortocoronary bypass graft: Secondary | ICD-10-CM | POA: Diagnosis not present

## 2017-11-12 DIAGNOSIS — N170 Acute kidney failure with tubular necrosis: Secondary | ICD-10-CM | POA: Diagnosis present

## 2017-11-12 DIAGNOSIS — F329 Major depressive disorder, single episode, unspecified: Secondary | ICD-10-CM | POA: Diagnosis present

## 2017-11-12 DIAGNOSIS — Z833 Family history of diabetes mellitus: Secondary | ICD-10-CM

## 2017-11-12 DIAGNOSIS — Z6826 Body mass index (BMI) 26.0-26.9, adult: Secondary | ICD-10-CM

## 2017-11-12 DIAGNOSIS — Z7901 Long term (current) use of anticoagulants: Secondary | ICD-10-CM | POA: Diagnosis not present

## 2017-11-12 DIAGNOSIS — I480 Paroxysmal atrial fibrillation: Secondary | ICD-10-CM | POA: Diagnosis present

## 2017-11-12 DIAGNOSIS — E785 Hyperlipidemia, unspecified: Secondary | ICD-10-CM | POA: Diagnosis present

## 2017-11-12 DIAGNOSIS — K810 Acute cholecystitis: Secondary | ICD-10-CM | POA: Diagnosis present

## 2017-11-12 DIAGNOSIS — R062 Wheezing: Secondary | ICD-10-CM

## 2017-11-12 DIAGNOSIS — K819 Cholecystitis, unspecified: Secondary | ICD-10-CM | POA: Diagnosis not present

## 2017-11-12 DIAGNOSIS — K449 Diaphragmatic hernia without obstruction or gangrene: Secondary | ICD-10-CM | POA: Diagnosis present

## 2017-11-12 DIAGNOSIS — I509 Heart failure, unspecified: Secondary | ICD-10-CM | POA: Diagnosis present

## 2017-11-12 DIAGNOSIS — I482 Chronic atrial fibrillation: Secondary | ICD-10-CM | POA: Diagnosis not present

## 2017-11-12 DIAGNOSIS — K8012 Calculus of gallbladder with acute and chronic cholecystitis without obstruction: Principal | ICD-10-CM | POA: Diagnosis present

## 2017-11-12 DIAGNOSIS — Z881 Allergy status to other antibiotic agents status: Secondary | ICD-10-CM | POA: Diagnosis not present

## 2017-11-12 DIAGNOSIS — I2510 Atherosclerotic heart disease of native coronary artery without angina pectoris: Secondary | ICD-10-CM | POA: Diagnosis present

## 2017-11-12 DIAGNOSIS — Z8546 Personal history of malignant neoplasm of prostate: Secondary | ICD-10-CM

## 2017-11-12 DIAGNOSIS — E669 Obesity, unspecified: Secondary | ICD-10-CM | POA: Diagnosis present

## 2017-11-12 DIAGNOSIS — R109 Unspecified abdominal pain: Secondary | ICD-10-CM | POA: Diagnosis not present

## 2017-11-12 DIAGNOSIS — M8580 Other specified disorders of bone density and structure, unspecified site: Secondary | ICD-10-CM | POA: Diagnosis present

## 2017-11-12 DIAGNOSIS — Z8249 Family history of ischemic heart disease and other diseases of the circulatory system: Secondary | ICD-10-CM | POA: Diagnosis not present

## 2017-11-12 DIAGNOSIS — Z8551 Personal history of malignant neoplasm of bladder: Secondary | ICD-10-CM

## 2017-11-12 DIAGNOSIS — E871 Hypo-osmolality and hyponatremia: Secondary | ICD-10-CM | POA: Diagnosis not present

## 2017-11-12 DIAGNOSIS — Z7982 Long term (current) use of aspirin: Secondary | ICD-10-CM | POA: Diagnosis not present

## 2017-11-12 DIAGNOSIS — I10 Essential (primary) hypertension: Secondary | ICD-10-CM | POA: Diagnosis not present

## 2017-11-12 DIAGNOSIS — K8000 Calculus of gallbladder with acute cholecystitis without obstruction: Secondary | ICD-10-CM | POA: Diagnosis not present

## 2017-11-12 DIAGNOSIS — Z923 Personal history of irradiation: Secondary | ICD-10-CM | POA: Diagnosis not present

## 2017-11-12 DIAGNOSIS — J9811 Atelectasis: Secondary | ICD-10-CM | POA: Diagnosis not present

## 2017-11-12 DIAGNOSIS — I110 Hypertensive heart disease with heart failure: Secondary | ICD-10-CM | POA: Diagnosis present

## 2017-11-12 DIAGNOSIS — I4891 Unspecified atrial fibrillation: Secondary | ICD-10-CM | POA: Diagnosis not present

## 2017-11-12 DIAGNOSIS — Z8614 Personal history of Methicillin resistant Staphylococcus aureus infection: Secondary | ICD-10-CM

## 2017-11-12 DIAGNOSIS — K8020 Calculus of gallbladder without cholecystitis without obstruction: Secondary | ICD-10-CM | POA: Diagnosis not present

## 2017-11-12 DIAGNOSIS — I255 Ischemic cardiomyopathy: Secondary | ICD-10-CM | POA: Diagnosis present

## 2017-11-12 DIAGNOSIS — N400 Enlarged prostate without lower urinary tract symptoms: Secondary | ICD-10-CM | POA: Diagnosis present

## 2017-11-12 DIAGNOSIS — J90 Pleural effusion, not elsewhere classified: Secondary | ICD-10-CM | POA: Diagnosis not present

## 2017-11-12 DIAGNOSIS — Z01810 Encounter for preprocedural cardiovascular examination: Secondary | ICD-10-CM | POA: Diagnosis not present

## 2017-11-12 LAB — URINALYSIS, COMPLETE (UACMP) WITH MICROSCOPIC
Bacteria, UA: NONE SEEN
Bilirubin Urine: NEGATIVE
Glucose, UA: 50 mg/dL — AB
Hgb urine dipstick: NEGATIVE
Ketones, ur: NEGATIVE mg/dL
Leukocytes, UA: NEGATIVE
Nitrite: NEGATIVE
Protein, ur: 30 mg/dL — AB
Specific Gravity, Urine: >1.046 — ABNORMAL HIGH (ref 1.005–1.030)
Squamous Epithelial / LPF: NONE SEEN (ref 0–5)
pH: 6 (ref 5.0–8.0)

## 2017-11-12 LAB — PROTIME-INR
INR: 1.02
Prothrombin Time: 13.3 seconds (ref 11.4–15.2)

## 2017-11-12 LAB — LIPASE, BLOOD: Lipase: 23 U/L (ref 11–51)

## 2017-11-12 LAB — COMPREHENSIVE METABOLIC PANEL
ALT: 38 U/L (ref 0–44)
AST: 38 U/L (ref 15–41)
Albumin: 4.1 g/dL (ref 3.5–5.0)
Alkaline Phosphatase: 60 U/L (ref 38–126)
Anion gap: 8 (ref 5–15)
BUN: 14 mg/dL (ref 8–23)
CO2: 28 mmol/L (ref 22–32)
Calcium: 8.8 mg/dL — ABNORMAL LOW (ref 8.9–10.3)
Chloride: 94 mmol/L — ABNORMAL LOW (ref 98–111)
Creatinine, Ser: 0.88 mg/dL (ref 0.61–1.24)
GFR calc Af Amer: >60 mL/min (ref >60)
GFR calc non Af Amer: >60 mL/min (ref >60)
Glucose, Bld: 150 mg/dL — ABNORMAL HIGH (ref 70–99)
Potassium: 4.2 mmol/L (ref 3.5–5.1)
Sodium: 130 mmol/L — ABNORMAL LOW (ref 135–145)
Total Bilirubin: 1 mg/dL (ref 0.3–1.2)
Total Protein: 7.1 g/dL (ref 6.5–8.1)

## 2017-11-12 LAB — HEPARIN LEVEL (UNFRACTIONATED): Heparin Unfractionated: 0.35 IU/mL (ref 0.30–0.70)

## 2017-11-12 LAB — CBC
HCT: 37.6 % — ABNORMAL LOW (ref 40.0–52.0)
Hemoglobin: 13.1 g/dL (ref 13.0–18.0)
MCH: 34.3 pg — ABNORMAL HIGH (ref 26.0–34.0)
MCHC: 34.9 g/dL (ref 32.0–36.0)
MCV: 98.2 fL (ref 80.0–100.0)
Platelets: 205 10*3/uL (ref 150–440)
RBC: 3.83 MIL/uL — ABNORMAL LOW (ref 4.40–5.90)
RDW: 12.9 % (ref 11.5–14.5)
WBC: 19.2 10*3/uL — ABNORMAL HIGH (ref 3.8–10.6)

## 2017-11-12 LAB — LACTIC ACID, PLASMA
Lactic Acid, Venous: 1.4 mmol/L (ref 0.5–1.9)
Lactic Acid, Venous: 1.8 mmol/L (ref 0.5–1.9)

## 2017-11-12 LAB — APTT: aPTT: 27 seconds (ref 24–36)

## 2017-11-12 MED ORDER — MORPHINE SULFATE (PF) 2 MG/ML IV SOLN
2.0000 mg | INTRAVENOUS | Status: DC | PRN
  Administered 2017-11-12 – 2017-11-13 (×3): 2 mg via INTRAVENOUS
  Filled 2017-11-12 (×3): qty 1

## 2017-11-12 MED ORDER — DENOSUMAB 60 MG/ML ~~LOC~~ SOSY: 60.0000 mg | PREFILLED_SYRINGE | SUBCUTANEOUS | Status: DC

## 2017-11-12 MED ORDER — IOPAMIDOL (ISOVUE-300) INJECTION 61%
30.0000 mL | Freq: Once | INTRAVENOUS | Status: AC | PRN
  Administered 2017-11-12: 30 mL via ORAL

## 2017-11-12 MED ORDER — SERTRALINE HCL 50 MG PO TABS
50.0000 mg | ORAL_TABLET | Freq: Every evening | ORAL | Status: DC
  Administered 2017-11-12 – 2017-11-14 (×3): 50 mg via ORAL
  Filled 2017-11-12 (×3): qty 1

## 2017-11-12 MED ORDER — SODIUM CHLORIDE 0.9 % IV BOLUS
500.0000 mL | Freq: Once | INTRAVENOUS | Status: AC
Start: 2017-11-12 — End: 2017-11-12
  Administered 2017-11-12: 500 mL via INTRAVENOUS

## 2017-11-12 MED ORDER — DIGOXIN 125 MCG PO TABS
0.1250 mg | ORAL_TABLET | Freq: Every day | ORAL | Status: DC
  Administered 2017-11-12 – 2017-11-15 (×4): 0.125 mg via ORAL
  Filled 2017-11-12 (×5): qty 1

## 2017-11-12 MED ORDER — SODIUM CHLORIDE 0.9 % IV SOLN
INTRAVENOUS | Status: DC
  Administered 2017-11-12 – 2017-11-13 (×2): via INTRAVENOUS

## 2017-11-12 MED ORDER — ADULT MULTIVITAMIN W/MINERALS CH
1.0000 | ORAL_TABLET | Freq: Every day | ORAL | Status: DC
  Administered 2017-11-13 – 2017-11-15 (×3): 1 via ORAL
  Filled 2017-11-12 (×3): qty 1

## 2017-11-12 MED ORDER — BISACODYL 5 MG PO TBEC: 5.0000 mg | DELAYED_RELEASE_TABLET | Freq: Every day | ORAL | Status: DC | PRN

## 2017-11-12 MED ORDER — ACETAMINOPHEN 325 MG PO TABS
650.0000 mg | ORAL_TABLET | Freq: Four times a day (QID) | ORAL | Status: DC | PRN
  Administered 2017-11-12 – 2017-11-14 (×3): 650 mg via ORAL
  Filled 2017-11-12 (×3): qty 2

## 2017-11-12 MED ORDER — HEPARIN (PORCINE) IN NACL 100-0.45 UNIT/ML-% IJ SOLN
1200.0000 [IU]/h | INTRAMUSCULAR | Status: DC
  Administered 2017-11-12: 1200 [IU]/h via INTRAVENOUS
  Filled 2017-11-12: qty 250

## 2017-11-12 MED ORDER — TRAZODONE HCL 50 MG PO TABS: 25.0000 mg | ORAL_TABLET | Freq: Every evening | ORAL | Status: DC | PRN

## 2017-11-12 MED ORDER — TRAZODONE HCL 100 MG PO TABS
100.0000 mg | ORAL_TABLET | Freq: Every day | ORAL | Status: DC
  Administered 2017-11-12 – 2017-11-14 (×3): 100 mg via ORAL
  Filled 2017-11-12 (×3): qty 1

## 2017-11-12 MED ORDER — ASPIRIN EC 81 MG PO TBEC
81.0000 mg | DELAYED_RELEASE_TABLET | Freq: Every day | ORAL | Status: DC
  Administered 2017-11-13 – 2017-11-14 (×2): 81 mg via ORAL
  Filled 2017-11-12 (×3): qty 1

## 2017-11-12 MED ORDER — VITAMIN B-12 1000 MCG PO TABS
5000.0000 ug | ORAL_TABLET | Freq: Every day | ORAL | Status: DC
  Administered 2017-11-13 – 2017-11-15 (×3): 5000 ug via ORAL
  Filled 2017-11-12 (×4): qty 5

## 2017-11-12 MED ORDER — ONDANSETRON HCL 4 MG PO TABS: 4.0000 mg | ORAL_TABLET | Freq: Four times a day (QID) | ORAL | Status: DC | PRN

## 2017-11-12 MED ORDER — HEPARIN BOLUS VIA INFUSION
4100.0000 [IU] | Freq: Once | INTRAVENOUS | Status: AC
  Administered 2017-11-12: 4100 [IU] via INTRAVENOUS
  Filled 2017-11-12: qty 4100

## 2017-11-12 MED ORDER — PIPERACILLIN-TAZOBACTAM 3.375 G IVPB
3.3750 g | Freq: Three times a day (TID) | INTRAVENOUS | Status: DC
  Administered 2017-11-12 – 2017-11-15 (×9): 3.375 g via INTRAVENOUS
  Filled 2017-11-12 (×8): qty 50

## 2017-11-12 MED ORDER — AMIODARONE HCL 200 MG PO TABS
200.0000 mg | ORAL_TABLET | Freq: Every day | ORAL | Status: DC
  Administered 2017-11-12 – 2017-11-15 (×4): 200 mg via ORAL
  Filled 2017-11-12 (×4): qty 1

## 2017-11-12 MED ORDER — DOCUSATE SODIUM 100 MG PO CAPS
100.0000 mg | ORAL_CAPSULE | Freq: Two times a day (BID) | ORAL | Status: DC
  Administered 2017-11-12 – 2017-11-15 (×6): 100 mg via ORAL
  Filled 2017-11-12 (×6): qty 1

## 2017-11-12 MED ORDER — ONDANSETRON HCL 4 MG/2ML IJ SOLN: 4.0000 mg | Freq: Four times a day (QID) | INTRAMUSCULAR | Status: DC | PRN

## 2017-11-12 MED ORDER — METOPROLOL SUCCINATE ER 25 MG PO TB24
25.0000 mg | ORAL_TABLET | Freq: Every evening | ORAL | Status: DC
  Administered 2017-11-12 – 2017-11-14 (×3): 25 mg via ORAL
  Filled 2017-11-12 (×3): qty 1

## 2017-11-12 MED ORDER — ALBUTEROL SULFATE (2.5 MG/3ML) 0.083% IN NEBU
3.0000 mL | INHALATION_SOLUTION | Freq: Four times a day (QID) | RESPIRATORY_TRACT | Status: DC | PRN
  Administered 2017-11-13: 3 mL via RESPIRATORY_TRACT
  Filled 2017-11-12: qty 3

## 2017-11-12 MED ORDER — PIPERACILLIN-TAZOBACTAM 3.375 G IVPB
INTRAVENOUS | Status: AC
  Administered 2017-11-12: 3.375 g via INTRAVENOUS
  Filled 2017-11-12: qty 50

## 2017-11-12 MED ORDER — ACETAMINOPHEN 650 MG RE SUPP: 650.0000 mg | Freq: Four times a day (QID) | RECTAL | Status: DC | PRN

## 2017-11-12 MED ORDER — HYDROCODONE-ACETAMINOPHEN 5-325 MG PO TABS
1.0000 | ORAL_TABLET | ORAL | Status: DC | PRN
  Administered 2017-11-12 – 2017-11-13 (×3): 1 via ORAL
  Administered 2017-11-13 – 2017-11-14 (×2): 2 via ORAL
  Administered 2017-11-15: 1 via ORAL
  Filled 2017-11-12 (×3): qty 1
  Filled 2017-11-12 (×2): qty 2
  Filled 2017-11-12: qty 1

## 2017-11-12 MED ORDER — HEPARIN (PORCINE) IN NACL 100-0.45 UNIT/ML-% IJ SOLN: 12.0000 [IU]/kg/h | INTRAMUSCULAR | Status: DC

## 2017-11-12 MED ORDER — IOHEXOL 300 MG/ML  SOLN
100.0000 mL | Freq: Once | INTRAMUSCULAR | Status: AC | PRN
  Administered 2017-11-12: 100 mL via INTRAVENOUS

## 2017-11-12 NOTE — Consult Note (Signed)
ANTICOAGULATION CONSULT NOTE - Initial Consult  Pharmacy Consult for Heparin Drip  Indication: atrial fibrillation  Allergies  Allergen Reactions  . Moxifloxacin Hcl In Nacl Other (See Comments)    Reaction:  Confusion    Patient Measurements: Height: 6' (182.9 cm) Weight: 180 lb (81.6 kg) IBW/kg (Calculated) : 77.6  Vital Signs: Temp: 98.3 F (36.8 C) (07/14 0850) Temp Source: Oral (07/14 0850) BP: 178/59 (07/14 1500) Pulse Rate: 60 (07/14 1500)  Labs: Recent Labs    11/12/17 0855  HGB 13.1  HCT 37.6*  PLT 205  CREATININE 0.88    Estimated Creatinine Clearance: 67.4 mL/min (by C-G formula based on SCr of 0.88 mg/dL).   Medical History: Past Medical History:  Diagnosis Date  . Atrial fibrillation ([place])    on eliquis  . Bladder cancer ([place])   . BPH (benign prostatic hyperplasia)   . CAD (coronary artery disease)    s/p CABG in 2006  . Cardiomyopathy ([place])   . Depression    controlled;   . Hiatal hernia   . Hx MRSA infection   . Hyperlipidemia   . Hypertension   . Prostate cancer [facility])    Assessment: Pharmacy consulted for heparin drip dosing and monitoring. Patient was taking Eliquis PTA, last dose reported on 7/13 evening.    Goal of Therapy:  Heparin level 0.3-0.7 units/ml aPTT 66-102 seconds Monitor platelets by anticoagulation protocol: Yes   Plan:  Baseline labs ordered.  Give 4100 units bolus x 1 Start heparin infusion at 1200 units/hr Check anti-Xa and aPTT level in 8 hours and daily while on heparin. Will need to adjust heparin drip by aPTT levels until aPTT and HL correlate.  Continue to monitor H&H and platelets  Pernell Dupre, PharmD, BCPS Clinical Pharmacist 11/12/2017 3:25 PM

## 2017-11-12 NOTE — ED Notes (Signed)
Charles Cook ED medic starting IV on pt
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
## 2017-11-12 NOTE — Progress Notes (Signed)
Family Meeting Note  Advance Directive:yes  Today a meeting took place with the Patient.  Patient daughter told me that he has no healthcare power of attorney and also wants to be full code at this time.` dicussed about his underlying medical problems including CAD, CABG, atrial fibrillation, advanced age.  Need to readdress the CODE STATUS again  During his  hospitalization.  At this time he will be full code.  The following clinical team members were present during this meeting:MD  The following were discussed:Patient's diagnosis: , Patient's progosis: Unable to determine and Goals for treatment: Continue present management  Additional follow-up to be provided:will follow  Time spent during discussion:20 minutes  Epifanio Lesches, MD

## 2017-11-12 NOTE — ED Triage Notes (Signed)
Pt her with sons-in-law. Pt states "i'm unable to satisfy my hunger." states pressure in upper abd. Pt states he can't eat even though he's hungry. Symptoms x 1 week. Some N&V. Denies diarrhea and constipation. Denies bloating. Denies passing gas. Denies urinary symptoms, states normal urination.
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
## 2017-11-12 NOTE — ED Notes (Signed)
Pharmacy called to have blue top drawn for heparin to be started - advised them that this nurse was about to call report and this would need to be completed when he arrived to floor

## 2017-11-12 NOTE — Progress Notes (Signed)
   11/12/17 1705  Clinical Encounter Type  Visited With Patient and family together  Visit Type Initial;Spiritual support  Referral From Nurse  Consult/Referral To [facility]   [place] received an OR to educate the PT on the AD process. Ch left paperwork with PT and his family to be completed when ever he is ready. CH will follow up if needed.
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
## 2017-11-12 NOTE — Consult Note (Signed)
Pharmacy Antibiotic Note  Charles Cook is a 84 y.o. male admitted on 11/12/2017 with intra-abdominal Infection. Pharmacy has been consulted for Zosyn dosing.  Plan: Start Zosyn 3.375 IV EI every 8 hours.   Height: 6' (182.9 cm) Weight: 180 lb (81.6 kg) IBW/kg (Calculated) : 77.6  Temp (24hrs), Avg:98.3 F (36.8 C), Min:98.3 F (36.8 C), Max:98.3 F (36.8 C)  Recent Labs  Lab 11/12/17 0855 11/12/17 1035  WBC 19.2*  --   CREATININE 0.88  --   LATICACIDVEN  --  1.4    Estimated Creatinine Clearance: 67.4 mL/min (by C-G formula based on SCr of 0.88 mg/dL).    Allergies  Allergen Reactions  . Moxifloxacin Hcl In Nacl Other (See Comments)    Reaction:  Confusion     Antimicrobials this admission: 7/14 Zosyn >>   Thank you for allowing pharmacy to be a part of this patient's care.  Pernell Dupre, PharmD, BCPS Clinical Pharmacist 11/12/2017 3:20 PM

## 2017-11-12 NOTE — ED Notes (Signed)
Patient transported to CT 

## 2017-11-12 NOTE — Progress Notes (Signed)
Pt daughter came to visit this evening and she states she reviewed his medication box/pouch at home and he has not taken any of his medications since Thursday. She is aware this note placed in chart.

## 2017-11-13 ENCOUNTER — Inpatient Hospital Stay: Payer: Medicare Other

## 2017-11-13 ENCOUNTER — Encounter: Payer: Self-pay | Admitting: *Deleted

## 2017-11-13 DIAGNOSIS — K810 Acute cholecystitis: Secondary | ICD-10-CM

## 2017-11-13 LAB — CBC
HCT: 30.4 % — ABNORMAL LOW (ref 40.0–52.0)
Hemoglobin: 10.6 g/dL — ABNORMAL LOW (ref 13.0–18.0)
MCH: 34.1 pg — ABNORMAL HIGH (ref 26.0–34.0)
MCHC: 35 g/dL (ref 32.0–36.0)
MCV: 97.4 fL (ref 80.0–100.0)
Platelets: 163 10*3/uL (ref 150–440)
RBC: 3.12 MIL/uL — ABNORMAL LOW (ref 4.40–5.90)
RDW: 12.9 % (ref 11.5–14.5)
WBC: 17.7 10*3/uL — ABNORMAL HIGH (ref 3.8–10.6)

## 2017-11-13 LAB — BASIC METABOLIC PANEL
Anion gap: 6 (ref 5–15)
BUN: 14 mg/dL (ref 8–23)
CO2: 25 mmol/L (ref 22–32)
Calcium: 7.3 mg/dL — ABNORMAL LOW (ref 8.9–10.3)
Chloride: 95 mmol/L — ABNORMAL LOW (ref 98–111)
Creatinine, Ser: 0.97 mg/dL (ref 0.61–1.24)
GFR calc Af Amer: >60 mL/min (ref >60)
GFR calc non Af Amer: >60 mL/min (ref >60)
Glucose, Bld: 122 mg/dL — ABNORMAL HIGH (ref 70–99)
Potassium: 4 mmol/L (ref 3.5–5.1)
Sodium: 126 mmol/L — ABNORMAL LOW (ref 135–145)

## 2017-11-13 LAB — APTT
aPTT: 143 seconds — ABNORMAL HIGH (ref 24–36)
aPTT: 77 seconds — ABNORMAL HIGH (ref 24–36)

## 2017-11-13 LAB — HEPARIN LEVEL (UNFRACTIONATED): Heparin Unfractionated: 1.22 IU/mL — ABNORMAL HIGH (ref 0.30–0.70)

## 2017-11-13 MED ORDER — HEPARIN (PORCINE) IN NACL 100-0.45 UNIT/ML-% IJ SOLN
1100.0000 [IU]/h | INTRAMUSCULAR | Status: DC
  Filled 2017-11-13: qty 250

## 2017-11-13 MED ORDER — FENTANYL CITRATE (PF) 100 MCG/2ML IJ SOLN
INTRAMUSCULAR | Status: AC
  Filled 2017-11-13: qty 4

## 2017-11-13 MED ORDER — FENTANYL CITRATE (PF) 100 MCG/2ML IJ SOLN
INTRAMUSCULAR | Status: AC | PRN
  Administered 2017-11-13: 50 ug via INTRAVENOUS
  Administered 2017-11-13 (×2): 25 ug via INTRAVENOUS

## 2017-11-13 MED ORDER — MIDAZOLAM HCL 2 MG/2ML IJ SOLN
INTRAMUSCULAR | Status: AC | PRN
  Administered 2017-11-13: 1 mg via INTRAVENOUS

## 2017-11-13 MED ORDER — MIDAZOLAM HCL 5 MG/5ML IJ SOLN
INTRAMUSCULAR | Status: AC
  Filled 2017-11-13: qty 10

## 2017-11-13 MED ORDER — FUROSEMIDE 10 MG/ML IJ SOLN
40.0000 mg | Freq: Once | INTRAMUSCULAR | Status: AC
  Administered 2017-11-13: 40 mg via INTRAVENOUS
  Filled 2017-11-13: qty 4

## 2017-11-13 MED ORDER — HEPARIN (PORCINE) IN NACL 100-0.45 UNIT/ML-% IJ SOLN
1100.0000 [IU]/h | INTRAMUSCULAR | Status: DC
  Administered 2017-11-13 – 2017-11-14 (×2): 1100 [IU]/h via INTRAVENOUS
  Filled 2017-11-13 (×2): qty 250

## 2017-11-13 MED ORDER — SODIUM CHLORIDE 0.9% FLUSH
5.0000 mL | Freq: Three times a day (TID) | INTRAVENOUS | Status: DC
  Administered 2017-11-13 – 2017-11-15 (×6): 5 mL

## 2017-11-13 MED ORDER — MIDAZOLAM HCL 5 MG/5ML IJ SOLN
INTRAMUSCULAR | Status: AC | PRN
  Administered 2017-11-13 (×2): 1 mg via INTRAVENOUS

## 2017-11-13 MED ORDER — BOOST / RESOURCE BREEZE PO LIQD CUSTOM: 1.0000 | Freq: Three times a day (TID) | ORAL | Status: DC

## 2017-11-13 MED ORDER — LIDOCAINE HCL (PF) 1 % IJ SOLN
INTRAMUSCULAR | Status: AC | PRN
  Administered 2017-11-13: 5 mL

## 2017-11-13 NOTE — Procedures (Signed)
Interventional Radiology Procedure Note  Procedure: Percutaneous cholecystostomy tube placement with CT guidance.   Complications: None  Estimated Blood Loss: None  Recommendations: - Drain to bag - Return to IR in 4-6 weeks for drain check, cholangiogram and exchange.  - F/U with surgery to discuss options for elective cholecystectomy  Signed,  Criselda Peaches, MD

## 2017-11-13 NOTE — Progress Notes (Signed)
ANTICOAGULATION CONSULT NOTE - Initial Consult  Pharmacy Consult for Heparin  Indication: atrial fibrillation  Allergies  Allergen Reactions  . Moxifloxacin Hcl In Nacl Other (See Comments)    Reaction:  Confusion     Patient Measurements: Height: 6' (182.9 cm) Weight: 192 lb 14.4 oz (87.5 kg) IBW/kg (Calculated) : 77.6 Heparin Dosing Weight:    Vital Signs: Temp: 98.6 F (37 C) (07/15 0500) Temp Source: Oral (07/15 0500) BP: 135/60 (07/15 0953) Pulse Rate: 65 (07/15 1046)  Labs: Recent Labs    11/12/17 0855 11/12/17 1550 11/13/17 0120 11/13/17 0909  HGB 13.1  --  10.6*  --   HCT 37.6*  --  30.4*  --   PLT 205  --  163  --   APTT  --  27 143* 77*  LABPROT  --  13.3  --   --   INR  --  1.02  --   --   HEPARINUNFRC  --  0.35 1.22*  --   CREATININE 0.88  --  0.97  --     Estimated Creatinine Clearance: 61.1 mL/min (by C-G formula based on SCr of 0.97 mg/dL).   Medical History: Past Medical History:  Diagnosis Date  . Atrial fibrillation ([place])    on eliquis  . Bladder cancer ([place])   . BPH (benign prostatic hyperplasia)   . CAD (coronary artery disease)    s/p CABG in 2006  . Cardiomyopathy ([place])   . Depression    controlled;   . Hiatal hernia   . Hx MRSA infection   . Hyperlipidemia   . Hypertension   . Prostate cancer ([place])     Medications:  Medications Prior to Admission  Medication Sig Dispense Refill Last Dose  . amiodarone (PACERONE) 200 MG tablet Take 200 mg by mouth daily.    Past Week at 0700  . apixaban (ELIQUIS) 2.5 MG TABS tablet Take 1 tablet (2.5 mg total) by mouth 2 (two) times daily. 60 tablet 1 Past Week at 2000  . aspirin EC 81 MG tablet Take 81 mg by mouth at bedtime.   Past Week at 2000  . Calcium Carbonate-Vitamin D (CALCIUM-D) 600-400 MG-UNIT TABS Take 1 tablet by mouth 2 (two) times daily.    Past Week at 2000  . Cyanocobalamin (VITAMIN B-12) 5000 MCG SUBL Place 5,000 mcg under the tongue daily.   Past Week at 0700  . denosumab  (PROLIA) 60 MG/ML SOSY injection Inject 60 mg into the skin every 6 (six) months.   09/12/2017 at UNKNOWN  . digoxin (DIGOX) 0.25 MG tablet Take 0.5 tablets (0.125 mg total) by mouth daily. 90 tablet 1 Past Week at 0700  . diphenhydrAMINE (BENADRYL) 25 MG tablet Take 25 mg by mouth at bedtime as needed for allergies. Reported on 10/27/2015   PRN at PRN  . fluticasone (FLONASE) 50 MCG/ACT nasal spray USE 2 SPRAYS IN EACH NOSTRIL DAILY 48 g 1 PRN at PRN  . Leuprolide Acetate, 6 Month, (LUPRON DEPOT, 79-MONTH, IM) Inject into the muscle every 6 (six) months.   09/05/2017 at UNKNOWN  . metoprolol succinate (TOPROL-XL) 25 MG 24 hr tablet Take 1 tablet by mouth every evening.    Past Week at 2000  . Multiple Vitamin (MULTIVITAMIN WITH MINERALS) TABS tablet Take 1 tablet by mouth daily.   Past Week at 0700  . niacin (NIASPAN) 500 MG CR tablet TAKE ONE TABLET BY MOUTH ONCE DAILY 90 tablet 3 Past Week at 2000  . sertraline (ZOLOFT)  50 MG tablet Take 1 tablet by mouth daily.   Past Week at 2000  . traZODone (DESYREL) 100 MG tablet TAKE ONE TABLET BY MOUTH AT BEDTIME 90 tablet 1 Past Week at 2000  . albuterol (PROVENTIL HFA;VENTOLIN HFA) 108 (90 Base) MCG/ACT inhaler Inhale 2 puffs into the lungs every 6 (six) hours as needed for wheezing or shortness of breath. (Patient not taking: Reported on 09/12/2017) 1 Inhaler 0 Not Taking at Unknown time    Assessment: Pharmacy consulted for heparin drip dosing and monitoring. Patient was taking Eliquis PTA, last dose reported on 7/13 evening.     Goal of Therapy:  Heparin level 0.3-0.7 units/ml  APTT = 66 - 102  Monitor platelets by anticoagulation protocol: Yes   Plan:   Baseline labs ordered.  Give 4100 units bolus x 1 Start heparin infusion at 1200 units/hr Check anti-Xa and aPTT level in 8 hours and daily while on heparin. Will need to adjust heparin drip by aPTT levels until aPTT and HL correlate.  Continue to monitor H&H and platelets  7/15 @ 0100 : aPTT   = 143 ,  HL = 1.22  Will hold Heparin drip for 30 minutes and restart @ 1100 units/hr.  Will recheck aPTT 6 hrs after rate change.   7/15 AM aPTT 77. Continue current regimen. Recheck aPTT, CBC, and HL with tomorrow AM labs.  Ferdinand Revoir S 11/13/2017,11:23 AM

## 2017-11-13 NOTE — Progress Notes (Signed)
CC: cholecystitis  Subjective: Still having some intermittent pain. Recently had some pain meds and he is resting comfortable. Appreciate card involvement. Wbc improving, persistent hyponatremia Family todl me he has been off all his meds for the last 3 days  Objective: Vital signs in last 24 hours: Temp:  [98.4 F (36.9 C)-101.4 F (38.6 C)] 99 F (37.2 C) (07/15 1213) Pulse Rate:  [59-70] 62 (07/15 1213) Resp:  [18-32] 18 (07/15 1213) BP: (134-197)/(58-79) 140/63 (07/15 1213) SpO2:  [90 %-95 %] 90 % (07/15 1213) Weight:  [87.5 kg (192 lb 14.4 oz)] 87.5 kg (192 lb 14.4 oz) (07/15 0500) Last BM Date: 11/10/17(per pt)  Intake/Output from previous day: 07/14 0701 - 07/15 0700 In: 1637 [I.V.:1080; IV Piggyback:557] Out: 200 [Urine:200] Intake/Output this shift: No intake/output data recorded.  Physical exam: NAD, resting  Abd: soft, mild TTP no peritonitis Ext: no edema and well perfused  Lab Results: CBC  Recent Labs    11/12/17 0855 11/13/17 0120  WBC 19.2* 17.7*  HGB 13.1 10.6*  HCT 37.6* 30.4*  PLT 205 163   BMET Recent Labs    11/12/17 0855 11/13/17 0120  NA 130* 126*  K 4.2 4.0  CL 94* 95*  CO2 28 25  GLUCOSE 150* 122*  BUN 14 14  CREATININE 0.88 0.97  CALCIUM 8.8* 7.3*   PT/INR Recent Labs    11/12/17 1550  LABPROT 13.3  INR 1.02   ABG No results for input(s): PHART, HCO3 in the last 72 hours.  Invalid input(s): PCO2, PO2  Studies/Results: Dg Chest 2 View  Result Date: 11/12/2017 CLINICAL DATA:  Abdominal pain. EXAM: CHEST - 2 VIEW COMPARISON:  Chest x-ray dated June 17, 2017. FINDINGS: Stable mild cardiomegaly status post CABG. Normal pulmonary vascularity. Bibasilar atelectasis. No focal consolidation, pleural effusion, or pneumothorax. No acute osseous abnormality. IMPRESSION: Bibasilar atelectasis. Electronically Signed   By: Titus Dubin M.D.   On: 11/12/2017 12:46   Ct Abdomen Pelvis W Contrast  Result Date:  11/12/2017 CLINICAL DATA:  Pt states "I'm unable to satisfy my hunger." states pressure in upper abd. Pt states he can't eat even though he's hungry. Symptoms x 1 week. Some NANDV. Denies diarrhea and constipation. History of prostate cancer. EXAM: CT ABDOMEN AND PELVIS WITH CONTRAST TECHNIQUE: Multidetector CT imaging of the abdomen and pelvis was performed using the standard protocol following bolus administration of intravenous contrast. CONTRAST:  14mL OMNIPAQUE IOHEXOL 300 MG/ML SOLN, 65mL ISOVUE-300 IOPAMIDOL (ISOVUE-300) INJECTION 61% COMPARISON:  CT of the abdomen and pelvis 03/29/2016 FINDINGS: Lower chest: There is bibasilar atelectasis or scarring. The heart is mildly enlarged. There is coronary artery calcification. Median sternotomy and CABG. Hepatobiliary: Gallbladder is distended and contains numerous faint stones, bearing in size from 0.6-2.0 centimeters. There is mild pericholecystic stranding and a small amount of perihepatic fluid. Mild dilatation of the intrahepatic ducts. Pancreas: Within the mid body of the pancreas there is a low-attenuation lesion measuring 1.8 centimeters and stable in appearance. A similar low-attenuation lesion is identified in the pancreatic tail, measuring 8 millimeters. Spleen: Normal in size without focal abnormality. Adrenals/Urinary Tract: Adrenal glands are normal in appearance. Symmetric enhancement and excretion from both kidneys. No hydronephrosis or ureteral obstruction. Urinary bladder is normal in appearance. Stomach/Bowel: Stomach and small bowel loops are normal in appearance. The colon is normal in appearance. The appendix is well seen and has a normal appearance. Vascular/Lymphatic: There is dense atherosclerotic calcification not associated of the abdominal aorta with aneurysm. Although involved by atherosclerosis,  there is vascular opacification of the celiac axis, superior mesenteric artery, and inferior mesenteric artery. Normal appearance of the  portal venous system and inferior vena cava. No significant adenopathy. 1.7 centimeter LEFT external iliac lymph node is no longer enlarged. Reproductive: Surgical clips are identified in the region of the prostate gland. Seminal vesicles are normal in appearance. Other: There is a small amount of free pelvic fluid. Anterior abdominal wall is unremarkable. Musculoskeletal: Degenerative changes are seen at L5-S1. No suspicious lytic or blastic lesions are identified. IMPRESSION: 1. Distended gallbladder with surrounding inflammatory changes, perihepatic fluid, and gallstones, suspicious for acute cholecystitis. 2. Cardiomegaly.  Sternotomy.  Coronary artery disease. 3. Stable appearance of cystic lesions within the pancreatic body and tail. Follow-up CT of the abdomen with pancreatic protocol recommended in 2 years. 4. Normal appendix. 5.  Aortic atherosclerosis.  (ICD10-I70.0) 6. Prostatectomy. Electronically Signed   By: Nolon Nations M.D.   On: 11/12/2017 13:04    Anti-infectives: Anti-infectives (From admission, onward)   Start     Dose/Rate Route Frequency Ordered Stop   11/12/17 1400  piperacillin-tazobactam (ZOSYN) IVPB 3.375 g     3.375 g 12.5 mL/hr over 240 Minutes Intravenous Every 8 hours 11/12/17 1327        Assessment/Plan:  CholeCystitis for cholecystostomy tube hopefully today. discussed with the family and Dr. Vianne Bulls in detail. Need for emergent surgical intervention at this time. Cholecystostomy tube is still inadequately we will have to perform cholecystectomy. Continue A/Bs and heparing drip per hospitalist  Caroleen Hamman, MD, [facility]  11/13/2017

## 2017-11-13 NOTE — Progress Notes (Signed)
Patient returned from procedure with audible wheezing noted without auscultation and with auscultation. PRN neb treatment given, post neb treatment patient continued to have wheezing. Provider notified, maintenance fluids discontinued, IV lasix given and chest xray ordered. Will continue to monitor patient.
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
## 2017-11-13 NOTE — Progress Notes (Signed)
ANTICOAGULATION CONSULT NOTE - Initial Consult  Pharmacy Consult for Heparin  Indication: atrial fibrillation  Allergies  Allergen Reactions  . Moxifloxacin Hcl In Nacl Other (See Comments)    Reaction:  Confusion     Patient Measurements: Height: 6' (182.9 cm) Weight: 192 lb (87.1 kg) IBW/kg (Calculated) : 77.6 Heparin Dosing Weight:    Vital Signs: Temp: 97.9 F (36.6 C) (07/15 1413) Temp Source: Oral (07/15 1413) BP: 128/53 (07/15 1515) Pulse Rate: 58 (07/15 1515)  Labs: Recent Labs    11/12/17 0855 11/12/17 1550 11/13/17 0120 11/13/17 0909  HGB 13.1  --  10.6*  --   HCT 37.6*  --  30.4*  --   PLT 205  --  163  --   APTT  --  27 143* 77*  LABPROT  --  13.3  --   --   INR  --  1.02  --   --   HEPARINUNFRC  --  0.35 1.22*  --   CREATININE 0.88  --  0.97  --     Estimated Creatinine Clearance: 61.1 mL/min (by C-G formula based on SCr of 0.97 mg/dL).   Medical History: Past Medical History:  Diagnosis Date  . Atrial fibrillation ([place])    on eliquis  . Bladder cancer ([place])   . BPH (benign prostatic hyperplasia)   . CAD (coronary artery disease)    s/p CABG in 2006  . Cardiomyopathy ([place])   . Depression    controlled;   . Hiatal hernia   . Hx MRSA infection   . Hyperlipidemia   . Hypertension   . Prostate cancer ([place])     Medications:  Medications Prior to Admission  Medication Sig Dispense Refill Last Dose  . amiodarone (PACERONE) 200 MG tablet Take 200 mg by mouth daily.    Past Week at 0700  . apixaban (ELIQUIS) 2.5 MG TABS tablet Take 1 tablet (2.5 mg total) by mouth 2 (two) times daily. 60 tablet 1 Past Week at 2000  . aspirin EC 81 MG tablet Take 81 mg by mouth at bedtime.   Past Week at 2000  . Calcium Carbonate-Vitamin D (CALCIUM-D) 600-400 MG-UNIT TABS Take 1 tablet by mouth 2 (two) times daily.    Past Week at 2000  . Cyanocobalamin (VITAMIN B-12) 5000 MCG SUBL Place 5,000 mcg under the tongue daily.   Past Week at 0700  . denosumab  (PROLIA) 60 MG/ML SOSY injection Inject 60 mg into the skin every 6 (six) months.   09/12/2017 at UNKNOWN  . digoxin (DIGOX) 0.25 MG tablet Take 0.5 tablets (0.125 mg total) by mouth daily. 90 tablet 1 Past Week at 0700  . diphenhydrAMINE (BENADRYL) 25 MG tablet Take 25 mg by mouth at bedtime as needed for allergies. Reported on 10/27/2015   PRN at PRN  . fluticasone (FLONASE) 50 MCG/ACT nasal spray USE 2 SPRAYS IN EACH NOSTRIL DAILY 48 g 1 PRN at PRN  . Leuprolide Acetate, 6 Month, (LUPRON DEPOT, 26-MONTH, IM) Inject into the muscle every 6 (six) months.   09/05/2017 at UNKNOWN  . metoprolol succinate (TOPROL-XL) 25 MG 24 hr tablet Take 1 tablet by mouth every evening.    Past Week at 2000  . Multiple Vitamin (MULTIVITAMIN WITH MINERALS) TABS tablet Take 1 tablet by mouth daily.   Past Week at 0700  . niacin (NIASPAN) 500 MG CR tablet TAKE ONE TABLET BY MOUTH ONCE DAILY 90 tablet 3 Past Week at 2000  . sertraline (ZOLOFT) 50 MG  tablet Take 1 tablet by mouth daily.   Past Week at 2000  . traZODone (DESYREL) 100 MG tablet TAKE ONE TABLET BY MOUTH AT BEDTIME 90 tablet 1 Past Week at 2000  . albuterol (PROVENTIL HFA;VENTOLIN HFA) 108 (90 Base) MCG/ACT inhaler Inhale 2 puffs into the lungs every 6 (six) hours as needed for wheezing or shortness of breath. (Patient not taking: Reported on 09/12/2017) 1 Inhaler 0 Not Taking at Unknown time    Assessment: Pharmacy consulted for heparin drip dosing and monitoring. Patient was taking Eliquis PTA, last dose reported on 7/13 evening.     Goal of Therapy:  Heparin level 0.3-0.7 units/ml  APTT = 66 - 102  Monitor platelets by anticoagulation protocol: Yes   Plan:   Baseline labs ordered.  Give 4100 units bolus x 1 Start heparin infusion at 1200 units/hr Check anti-Xa and aPTT level in 8 hours and daily while on heparin. Will need to adjust heparin drip by aPTT levels until aPTT and HL correlate.  Continue to monitor H&H and platelets  7/15 @ 0100 : aPTT   = 143 ,  HL = 1.22  Will hold Heparin drip for 30 minutes and restart @ 1100 units/hr.  Will recheck aPTT 6 hrs after rate change.   7/15 AM aPTT 77. Continue current regimen. Recheck aPTT, CBC, and HL with tomorrow AM labs.  7/15 1545 Heparin suspended for procedure. Per RN finished around 1600. Asked RN to pass to nights to resume heparin at 2200. Labs already entered for 7/16 AM.  Trella Thurmond S 11/13/2017,3:44 PM

## 2017-11-13 NOTE — Progress Notes (Signed)
[place] [facility] [facility] [place] NAME: Charles Cook    MR#:  [id number]  DATE OF BIRTH:  07-15-1929  SUBJECTIVE:  CHIEF COMPLAINT:   Chief Complaint  Patient presents with  . Abdominal Pain   -Still has right upper quadrant abdominal pain and nausea.  Hyponatremia is present -Last dose of Eliquis was 3 days ago.  For cholecystostomy placement today  REVIEW OF SYSTEMS:  Review of Systems  Constitutional: Positive for malaise/fatigue. Negative for chills and fever.  HENT: Negative for ear discharge, hearing loss and nosebleeds.   Eyes: Negative for blurred vision and double vision.  Respiratory: Negative for cough, shortness of breath and wheezing.   Cardiovascular: Negative for chest pain and palpitations.  Gastrointestinal: Positive for abdominal pain and nausea. Negative for constipation, diarrhea and vomiting.  Genitourinary: Negative for dysuria.  Musculoskeletal: Negative for myalgias.  Neurological: Negative for dizziness, seizures, weakness and headaches.  Psychiatric/Behavioral: Negative for depression.    DRUG ALLERGIES:   Allergies  Allergen Reactions  . Moxifloxacin Hcl In Nacl Other (See Comments)    Reaction:  Confusion     VITALS:  Blood pressure 140/63, pulse 62, temperature 99 F (37.2 C), temperature source Oral, resp. rate 18, height 6' (1.829 m), weight 87.5 kg (192 lb 14.4 oz), SpO2 90 %.  PHYSICAL EXAMINATION:  Physical Exam  GENERAL:  84 y.o.-year-old elderly patient lying in the bed with no acute distress.  EYES: Pupils equal, round, reactive to light and accommodation. No scleral icterus. Extraocular muscles intact.  HEENT: Head atraumatic, normocephalic. Oropharynx and nasopharynx clear.  NECK:  Supple, no jugular venous distention. No thyroid enlargement, no tenderness.  LUNGS: Normal breath sounds bilaterally, no wheezing, rales,rhonchi or crepitation. No use of accessory muscles of respiration. Decreased  bibasilar breath sounds CARDIOVASCULAR: S1, S2 normal. No rubs, or gallops. 2/6 systolic murmur  ABDOMEN: Soft, RUQ abdominal pain, nondistended. Bowel sounds present. No organomegaly or mass.  EXTREMITIES: No pedal edema, cyanosis, or clubbing.  NEUROLOGIC: Cranial nerves II through XII are intact. Muscle strength 5/5 in all extremities. Sensation intact. Gait not checked. Global weakness noted PSYCHIATRIC: The patient is alert and oriented x 3.  SKIN: No obvious rash, lesion, or ulcer.    LABORATORY PANEL:   CBC Recent Labs  Lab 11/13/17 0120  WBC 17.7*  HGB 10.6*  HCT 30.4*  PLT 163   ------------------------------------------------------------------------------------------------------------------  Chemistries  Recent Labs  Lab 11/12/17 0855 11/13/17 0120  NA 130* 126*  K 4.2 4.0  CL 94* 95*  CO2 28 25  GLUCOSE 150* 122*  BUN 14 14  CREATININE 0.88 0.97  CALCIUM 8.8* 7.3*  AST 38  --   ALT 38  --   ALKPHOS 60  --   BILITOT 1.0  --    ------------------------------------------------------------------------------------------------------------------  Cardiac Enzymes No results for input(s): TROPONINI in the last 168 hours. ------------------------------------------------------------------------------------------------------------------  RADIOLOGY:  Dg Chest 2 View  Result Date: 11/12/2017 CLINICAL DATA:  Abdominal pain. EXAM: CHEST - 2 VIEW COMPARISON:  Chest x-ray dated June 17, 2017. FINDINGS: Stable mild cardiomegaly status post CABG. Normal pulmonary vascularity. Bibasilar atelectasis. No focal consolidation, pleural effusion, or pneumothorax. No acute osseous abnormality. IMPRESSION: Bibasilar atelectasis. Electronically Signed   By: Titus Dubin M.D.   On: 11/12/2017 12:46   Ct Abdomen Pelvis W Contrast  Result Date: 11/12/2017 CLINICAL DATA:  Pt states "I'm unable to satisfy my hunger." states pressure in upper abd. Pt states he can't eat even though  he's hungry. Symptoms x 1 week. Some NANDV. Denies diarrhea and constipation. History of prostate cancer. EXAM: CT ABDOMEN AND PELVIS WITH CONTRAST TECHNIQUE: Multidetector CT imaging of the abdomen and pelvis was performed using the standard protocol following bolus administration of intravenous contrast. CONTRAST:  160mL OMNIPAQUE IOHEXOL 300 MG/ML SOLN, 47mL ISOVUE-300 IOPAMIDOL (ISOVUE-300) INJECTION 61% COMPARISON:  CT of the abdomen and pelvis 03/29/2016 FINDINGS: Lower chest: There is bibasilar atelectasis or scarring. The heart is mildly enlarged. There is coronary artery calcification. Median sternotomy and CABG. Hepatobiliary: Gallbladder is distended and contains numerous faint stones, bearing in size from 0.6-2.0 centimeters. There is mild pericholecystic stranding and a small amount of perihepatic fluid. Mild dilatation of the intrahepatic ducts. Pancreas: Within the mid body of the pancreas there is a low-attenuation lesion measuring 1.8 centimeters and stable in appearance. A similar low-attenuation lesion is identified in the pancreatic tail, measuring 8 millimeters. Spleen: Normal in size without focal abnormality. Adrenals/Urinary Tract: Adrenal glands are normal in appearance. Symmetric enhancement and excretion from both kidneys. No hydronephrosis or ureteral obstruction. Urinary bladder is normal in appearance. Stomach/Bowel: Stomach and small bowel loops are normal in appearance. The colon is normal in appearance. The appendix is well seen and has a normal appearance. Vascular/Lymphatic: There is dense atherosclerotic calcification not associated of the abdominal aorta with aneurysm. Although involved by atherosclerosis, there is vascular opacification of the celiac axis, superior mesenteric artery, and inferior mesenteric artery. Normal appearance of the portal venous system and inferior vena cava. No significant adenopathy. 1.7 centimeter LEFT external iliac lymph node is no longer enlarged.  Reproductive: Surgical clips are identified in the region of the prostate gland. Seminal vesicles are normal in appearance. Other: There is a small amount of free pelvic fluid. Anterior abdominal wall is unremarkable. Musculoskeletal: Degenerative changes are seen at L5-S1. No suspicious lytic or blastic lesions are identified. IMPRESSION: 1. Distended gallbladder with surrounding inflammatory changes, perihepatic fluid, and gallstones, suspicious for acute cholecystitis. 2. Cardiomegaly.  Sternotomy.  Coronary artery disease. 3. Stable appearance of cystic lesions within the pancreatic body and tail. Follow-up CT of the abdomen with pancreatic protocol recommended in 2 years. 4. Normal appendix. 5.  Aortic atherosclerosis.  (ICD10-I70.0) 6. Prostatectomy. Electronically Signed   By: Nolon Nations M.D.   On: 11/12/2017 13:04    EKG:   Orders placed or performed during the hospital encounter of 11/12/17  . EKG 12-Lead  . EKG 12-Lead  . ED EKG  . ED EKG    ASSESSMENT AND PLAN:   84 year old male with past medical history significant for CAD status post CABG, atrial fibrillation on Eliquis, history of prostate cancer on treatment, hypertension, congestive heart failure with last EF of 35% brought to hospital secondary to right upper quadrant abdominal pain and nausea.  1.  Acute cholecystitis-surgery consulted -On IV Zosyn and fluids. -Acceptable risk for surgery but has multiple medical problems.  For IR cholecystostomy drain placement now -If no improvement, consider laparoscopic cholecystectomy  2.  Hyponatremia-known history of chronic hyponatremia Baseline sodium is 129.  Drop noted since admission -Gentle hydration being received.  Monitor carefully -EF is only 30%.  3.  Permanent atrial fibrillation-continue amiodarone and digoxin -Also on metoprolol.  Eliquis held for procedure.  Continue heparin drip  4.  CAD and ischemic cardiomyopathy-stable at this time.  Active at baseline.   No chest pain -Continue aspirin, Toprol. -Appreciate cardiology consult  5.  DVT prophylaxis-already on heparin drip     All the records  are reviewed and case discussed with Care Management/Social Workerr. Management plans discussed with the patient, family and they are in agreement.  CODE STATUS: Full Code  TOTAL TIME TAKING CARE OF THIS PATIENT: 39 minutes.   POSSIBLE D/C IN 2-3 DAYS, DEPENDING ON CLINICAL CONDITION.   Gladstone Lighter M.D on 11/13/2017 at 12:41 PM  Between 7am to 6pm - Pager - [phone]  After 6pm go to [email or web address] - password [email or web address] [facility]  Office  [phone]  CC: Primary care physician; Mar Daring, PA-C

## 2017-11-13 NOTE — Progress Notes (Signed)
Post IR anticoagulant consult  Patient on heparin drip. Suspended for procedure. Per RN procedure finished around 1600. Asked RN to pass to night shift to resume heparin drip at 2200. Will f/u labs in AM.  Sim Boast, PharmD, BCPS  11/13/17 3:47 PM

## 2017-11-13 NOTE — Progress Notes (Signed)
ANTICOAGULATION CONSULT NOTE - Initial Consult  Pharmacy Consult for Heparin  Indication: atrial fibrillation  Allergies  Allergen Reactions  . Moxifloxacin Hcl In Nacl Other (See Comments)    Reaction:  Confusion     Patient Measurements: Height: 6' (182.9 cm) Weight: 180 lb (81.6 kg) IBW/kg (Calculated) : 77.6 Heparin Dosing Weight:    Vital Signs: Temp: 98.4 F (36.9 C) (07/14 2131) Temp Source: Oral (07/14 2131) BP: 176/66 (07/14 2014) Pulse Rate: 68 (07/14 2014)  Labs: Recent Labs    11/12/17 0855 11/12/17 1550 11/13/17 0120  HGB 13.1  --  10.6*  HCT 37.6*  --  30.4*  PLT 205  --  163  APTT  --  27 143*  LABPROT  --  13.3  --   INR  --  1.02  --   HEPARINUNFRC  --  0.35 1.22*  CREATININE 0.88  --  0.97    Estimated Creatinine Clearance: 61.1 mL/min (by C-G formula based on SCr of 0.97 mg/dL).   Medical History: Past Medical History:  Diagnosis Date  . Atrial fibrillation ([place])    on eliquis  . Bladder cancer ([place])   . BPH (benign prostatic hyperplasia)   . CAD (coronary artery disease)    s/p CABG in 2006  . Cardiomyopathy ([place])   . Depression    controlled;   . Hiatal hernia   . Hx MRSA infection   . Hyperlipidemia   . Hypertension   . Prostate cancer ([place])     Medications:  Medications Prior to Admission  Medication Sig Dispense Refill Last Dose  . amiodarone (PACERONE) 200 MG tablet Take 200 mg by mouth daily.    Past Week at 0700  . apixaban (ELIQUIS) 2.5 MG TABS tablet Take 1 tablet (2.5 mg total) by mouth 2 (two) times daily. 60 tablet 1 Past Week at 2000  . aspirin EC 81 MG tablet Take 81 mg by mouth at bedtime.   Past Week at 2000  . Calcium Carbonate-Vitamin D (CALCIUM-D) 600-400 MG-UNIT TABS Take 1 tablet by mouth 2 (two) times daily.    Past Week at 2000  . Cyanocobalamin (VITAMIN B-12) 5000 MCG SUBL Place 5,000 mcg under the tongue daily.   Past Week at 0700  . denosumab (PROLIA) 60 MG/ML SOSY injection Inject 60 mg into the skin  every 6 (six) months.   09/12/2017 at UNKNOWN  . digoxin (DIGOX) 0.25 MG tablet Take 0.5 tablets (0.125 mg total) by mouth daily. 90 tablet 1 Past Week at 0700  . diphenhydrAMINE (BENADRYL) 25 MG tablet Take 25 mg by mouth at bedtime as needed for allergies. Reported on 10/27/2015   PRN at PRN  . fluticasone (FLONASE) 50 MCG/ACT nasal spray USE 2 SPRAYS IN EACH NOSTRIL DAILY 48 g 1 PRN at PRN  . Leuprolide Acetate, 6 Month, (LUPRON DEPOT, 104-MONTH, IM) Inject into the muscle every 6 (six) months.   09/05/2017 at UNKNOWN  . metoprolol succinate (TOPROL-XL) 25 MG 24 hr tablet Take 1 tablet by mouth every evening.    Past Week at 2000  . Multiple Vitamin (MULTIVITAMIN WITH MINERALS) TABS tablet Take 1 tablet by mouth daily.   Past Week at 0700  . niacin (NIASPAN) 500 MG CR tablet TAKE ONE TABLET BY MOUTH ONCE DAILY 90 tablet 3 Past Week at 2000  . sertraline (ZOLOFT) 50 MG tablet Take 1 tablet by mouth daily.   Past Week at 2000  . traZODone (DESYREL) 100 MG tablet TAKE ONE TABLET BY  MOUTH AT BEDTIME 90 tablet 1 Past Week at 2000  . albuterol (PROVENTIL HFA;VENTOLIN HFA) 108 (90 Base) MCG/ACT inhaler Inhale 2 puffs into the lungs every 6 (six) hours as needed for wheezing or shortness of breath. (Patient not taking: Reported on 09/12/2017) 1 Inhaler 0 Not Taking at Unknown time    Assessment: Pharmacy consulted for heparin drip dosing and monitoring. Patient was taking Eliquis PTA, last dose reported on 7/13 evening.     Goal of Therapy:  Heparin level 0.3-0.7 units/ml  APTT = 66 - 102  Monitor platelets by anticoagulation protocol: Yes   Plan:   Baseline labs ordered.  Give 4100 units bolus x 1 Start heparin infusion at 1200 units/hr Check anti-Xa and aPTT level in 8 hours and daily while on heparin. Will need to adjust heparin drip by aPTT levels until aPTT and HL correlate.  Continue to monitor H&H and platelets  7/15 @ 0100 : aPTT  = 143 ,  HL = 1.22  Will hold Heparin drip for 30 minutes  and restart @ 1100 units/hr.  Will recheck aPTT 6 hrs after rate change.  Audrena Talaga D 11/13/2017,2:33 AM

## 2017-11-13 NOTE — Progress Notes (Signed)
Initial Nutrition Assessment  DOCUMENTATION CODES:   Not applicable  INTERVENTION:   Boost Breeze po TID, each supplement provides 250 kcal and 9 grams of protein  MVI daily   NUTRITION DIAGNOSIS:   Inadequate oral intake related to acute illness as evidenced by per patient/family report.  GOAL:   Patient will meet greater than or equal to 90% of their needs  MONITOR:   PO intake, Supplement acceptance, Labs, Weight trends, I & O's  REASON FOR ASSESSMENT:   Malnutrition Screening Tool    ASSESSMENT:   84 year old male with a history of ischemic cardiomyopathy, coronary artery disease CHF with ejection fraction of 30% anticoagulated on Eliquis and aspirin now presents with acute cholecystitis.  Visited pt's room today. Pt out of room for procedure at time of RD visit so history obtained from pt's family at bedside. Per family, pt with good appetite and oral intake at baseline but pt with decreased oral intake for several days pta r/t abdominal pain and nausea. Pt reported in the ED that over the last 6 months he has had early satiety and decreased appetite. Per family, pt loves to eat out and often eats away from home. Family reports pt is weight stable; this is confirmed in pt's chart. Pt does drink Ensure regularly at home. Pt has also been drinking Gatorade daily to try and increase his sodium levels. Per family, pt does not have any trouble chewing or swallowing. Pt did drink some sips of broth this morning but has since been NPO for IR placement of drain today. RD will order supplements to help pt meet his estimated needs. Will obtain nutrition focused physical exam at follow up. Family requesting low fat diet education prior to discharge.    Medications reviewed and include: aspirin, prolia, colace, MVI, B12, hydrocodone   Labs reviewed: Na 126(L), Cl 95(L), Ca 7.3(L) Wbc- 17.7(H), Hgb 10.6(L), Hct 30.4(L)  Unable to complete Nutrition-Focused physical exam at this time.    Diet Order:   Diet Order           Diet NPO time specified  Diet effective now         EDUCATION NEEDS:   Not appropriate for education at this time  Skin:  Skin Assessment: Reviewed RN Assessment  Last BM:  7/12  Height:   Ht Readings from Last 1 Encounters:  11/13/17 6' (1.829 m)    Weight:   Wt Readings from Last 1 Encounters:  11/13/17 192 lb (87.1 kg)    Ideal Body Weight:  80.9 kg  BMI:  Body mass index is 26.04 kg/m.  Estimated Nutritional Needs:   Kcal:  1900-2200kcal/day   Protein:  87-105g/day   Fluid:  per MD in setting of hyponatremia   Koleen Distance MS, RD, LDN Pager #- [phone] Office#- [phone] After Hours Pager: [phone]

## 2017-11-13 NOTE — Progress Notes (Signed)
Patient's lungs auscultated post IV lasix with decreased wheezing noted to posterior right and left lower lobes. Will continue to monitor.

## 2017-11-14 DIAGNOSIS — K819 Cholecystitis, unspecified: Secondary | ICD-10-CM

## 2017-11-14 LAB — BASIC METABOLIC PANEL
Anion gap: 8 (ref 5–15)
BUN: 25 mg/dL — ABNORMAL HIGH (ref 8–23)
CO2: 26 mmol/L (ref 22–32)
Calcium: 7.3 mg/dL — ABNORMAL LOW (ref 8.9–10.3)
Chloride: 94 mmol/L — ABNORMAL LOW (ref 98–111)
Creatinine, Ser: 1.75 mg/dL — ABNORMAL HIGH (ref 0.61–1.24)
GFR calc Af Amer: 39 mL/min — ABNORMAL LOW (ref >60)
GFR calc non Af Amer: 34 mL/min — ABNORMAL LOW (ref >60)
Glucose, Bld: 110 mg/dL — ABNORMAL HIGH (ref 70–99)
Potassium: 4.6 mmol/L (ref 3.5–5.1)
Sodium: 128 mmol/L — ABNORMAL LOW (ref 135–145)

## 2017-11-14 LAB — GLUCOSE, CAPILLARY: Glucose-Capillary: 127 mg/dL — ABNORMAL HIGH (ref 70–99)

## 2017-11-14 LAB — APTT: aPTT: 74 seconds — ABNORMAL HIGH (ref 24–36)

## 2017-11-14 LAB — CBC
HCT: 30.6 % — ABNORMAL LOW (ref 40.0–52.0)
Hemoglobin: 10.7 g/dL — ABNORMAL LOW (ref 13.0–18.0)
MCH: 35.5 pg — ABNORMAL HIGH (ref 26.0–34.0)
MCHC: 35 g/dL (ref 32.0–36.0)
MCV: 101.4 fL — ABNORMAL HIGH (ref 80.0–100.0)
Platelets: 152 10*3/uL (ref 150–440)
RBC: 3.02 MIL/uL — ABNORMAL LOW (ref 4.40–5.90)
RDW: 13.5 % (ref 11.5–14.5)
WBC: 14.1 10*3/uL — ABNORMAL HIGH (ref 3.8–10.6)

## 2017-11-14 LAB — HEPARIN LEVEL (UNFRACTIONATED): Heparin Unfractionated: 0.82 IU/mL — ABNORMAL HIGH (ref 0.30–0.70)

## 2017-11-14 MED ORDER — ENSURE ENLIVE PO LIQD
237.0000 mL | Freq: Two times a day (BID) | ORAL | Status: DC
  Administered 2017-11-14 – 2017-11-15 (×3): 237 mL via ORAL

## 2017-11-14 NOTE — Evaluation (Signed)
Physical Therapy Evaluation Patient Details Name: Charles Cook MRN: [id number] DOB: 10/11/1929 Today's Date: 11/14/2017   History of Present Illness  84 year old male s/p  percutaneous cholecystostomy tube placement with CT guidance 11/13/17. PMH CAD status post CABG, atrial fibrillation on Eliquis, history of prostate cancer on treatment, hypertension, congestive heart failure with last EF of 35%.  Clinical Impression  Patient A&Ox4 at start of session, states his R side pain is 6/10. Per patient and family, patient lives with wife in 1 story home with no stairs, ambulates with quad cane, independent for self care, but has aide that assists wife every day prior to admission. The patient was able to mobilize to EOB with supervision, use of bed rails, and extended time to maximize independence. Patient ambulated in room with CGA and quad cane ~11ft, with slight unsteadiness noted. Patient returned to bed with complaints of fatigue. Patient wanted to finish eating and sleep in bed, PT encouraged patient to be up in chair for dinner. The patient would benefit from further skilled PT to address changes from PLOF including decreased strength, endurance, activity tolerance, balance, and gait abnormalities.      Follow Up Recommendations Home health PT;Supervision/Assistance - 24 hour    Equipment Recommendations  None recommended by PT    Recommendations for Other Services       Precautions / Restrictions Precautions Precautions: Fall Restrictions Weight Bearing Restrictions: No      Mobility  Bed Mobility Overal bed mobility: Needs Assistance Bed Mobility: Supine to Sit;Sit to Supine     Supine to sit: Supervision Sit to supine: Supervision      Transfers Overall transfer level: Needs assistance Equipment used: Quad cane Transfers: Sit to/from Stand Sit to Stand: Min guard            Ambulation/Gait Ambulation/Gait assistance: Min guard Gait Distance (Feet): 15  Feet Assistive device: Quad cane Gait Pattern/deviations: Decreased stride length;Wide base of support     General Gait Details: decreased speed, patient slightly unsteady  Stairs            Wheelchair Mobility    Modified Rankin (Stroke Patients Only)       Balance Overall balance assessment: Needs assistance Sitting-balance support: Feet supported Sitting balance-Leahy Scale: Fair       Standing balance-Leahy Scale: Poor                               Pertinent Vitals/Pain Pain Assessment: 0-10 Pain Score: 6  Pain Descriptors / Indicators: Other (Comment)(surgical pain)    Home Living Family/patient expects to be discharged to:: Private residence Living Arrangements: Spouse/significant other Available Help at Discharge: Family Type of Home: House Home Access: Stairs to enter Entrance Stairs-Rails: None Entrance Stairs-Number of Steps: 1 threshold step Home Layout: One level Home Equipment: Grab bars - tub/shower;Cane - quad;Walker - 2 wheels;Cane - single point      Prior Function Level of Independence: Independent         Comments: Patient reports that he has fallen in the last 6 months, 2-3 months ago     Hand Dominance   Dominant Hand: Right    Extremity/Trunk Assessment   Upper Extremity Assessment Upper Extremity Assessment: Defer to OT evaluation;Overall WFL for tasks assessed;RUE deficits/detail;LUE deficits/detail RUE Deficits / Details: 3+/5 LUE Deficits / Details: 3+/5    Lower Extremity Assessment Lower Extremity Assessment: Generalized weakness;RLE deficits/detail;LLE deficits/detail RLE Deficits / Details: 3+/5 LLE  Deficits / Details: 3+/5       Communication   Communication: No difficulties  Cognition Arousal/Alertness: Awake/alert Behavior During Therapy: WFL for tasks assessed/performed                                          General Comments      Exercises     Assessment/Plan     PT Assessment Patient needs continued PT services  PT Problem List Decreased strength;Pain;Decreased activity tolerance;Decreased balance;Decreased mobility       PT Treatment Interventions DME instruction;Therapeutic exercise;Gait training;Balance training;Stair training;Neuromuscular re-education;Functional mobility training;Therapeutic activities;Patient/family education    PT Goals (Current goals can be found in the Care Plan section)  Acute Rehab PT Goals Patient Stated Goal: Patient would like to return home PT Goal Formulation: With patient Time For Goal Achievement: 11/28/17 Potential to Achieve Goals: Good    Frequency Min 2X/week   Barriers to discharge        Co-evaluation               AM-PAC PT "6 Clicks" Daily Activity  Outcome Measure Difficulty turning over in bed (including adjusting bedclothes, sheets and blankets)?: A Little Difficulty moving from lying on back to sitting on the side of the bed? : A Little Difficulty sitting down on and standing up from a chair with arms (e.g., wheelchair, bedside commode, etc,.)?: Unable Help needed moving to and from a bed to chair (including a wheelchair)?: A Little Help needed walking in hospital room?: A Little Help needed climbing 3-5 steps with a railing? : A Lot 6 Click Score: 15    End of Session Equipment Utilized During Treatment: Gait belt Activity Tolerance: Patient tolerated treatment well;Patient limited by fatigue Patient left: in bed;with family/visitor present;with call bell/phone within reach;with bed alarm set;with SCD's reapplied Nurse Communication: Mobility status PT Visit Diagnosis: Unsteadiness on feet (R26.81);Other abnormalities of gait and mobility (R26.89);Muscle weakness (generalized) (M62.81);Repeated falls (R29.6)    Time: 3343-5686 PT Time Calculation (min) (ACUTE ONLY): 31 min   Charges:   PT Evaluation $PT Eval Low Complexity: 1 Low PT Treatments $Therapeutic Activity: 8-22  mins   PT G Codes:       Charles Cook PT, DPT 1:57 PM,11/14/17 [phone]

## 2017-11-14 NOTE — Progress Notes (Signed)
Surgery wants to monitor for 1 more day before deciding if patient will need cholecystectomy this admission.  So we will continue heparin drip for now

## 2017-11-14 NOTE — Progress Notes (Signed)
ANTICOAGULATION CONSULT NOTE - Initial Consult  Pharmacy Consult for Heparin  Indication: atrial fibrillation  Allergies  Allergen Reactions  . Moxifloxacin Hcl In Nacl Other (See Comments)    Reaction:  Confusion     Patient Measurements: Height: 6' (182.9 cm) Weight: 201 lb 11.2 oz (91.5 kg) IBW/kg (Calculated) : 77.6 Heparin Dosing Weight:    Vital Signs: Temp: 98.6 F (37 C) (07/15 2038) Temp Source: Oral (07/15 2038) BP: 146/66 (07/15 2038) Pulse Rate: 67 (07/15 2038)  Labs: Recent Labs    11/12/17 0855  11/12/17 1550 11/13/17 0120 11/13/17 0909 11/14/17 0443  HGB 13.1  --   --  10.6*  --  10.7*  HCT 37.6*  --   --  30.4*  --  30.6*  PLT 205  --   --  163  --  152  APTT  --    < > 27 143* 77* 74*  LABPROT  --   --  13.3  --   --   --   INR  --   --  1.02  --   --   --   HEPARINUNFRC  --   --  0.35 1.22*  --  0.82*  CREATININE 0.88  --   --  0.97  --  1.75*   < > = values in this interval not displayed.    Estimated Creatinine Clearance: 33.9 mL/min (A) (by C-G formula based on SCr of 1.75 mg/dL (H)).   Medical History: Past Medical History:  Diagnosis Date  . Atrial fibrillation ([place])    on eliquis  . Bladder cancer ([place] [facility])   . BPH (benign prostatic hyperplasia)   . CAD (coronary artery disease)    s/p CABG in 2006  . Cardiomyopathy ([place])   . Depression    controlled;   . Hiatal hernia   . Hx MRSA infection   . Hyperlipidemia   . Hypertension   . Prostate cancer ([place])     Medications:  Medications Prior to Admission  Medication Sig Dispense Refill Last Dose  . amiodarone (PACERONE) 200 MG tablet Take 200 mg by mouth daily.    Past Week at 0700  . apixaban (ELIQUIS) 2.5 MG TABS tablet Take 1 tablet (2.5 mg total) by mouth 2 (two) times daily. 60 tablet 1 Past Week at 2000  . aspirin EC 81 MG tablet Take 81 mg by mouth at bedtime.   Past Week at 2000  . Calcium Carbonate-Vitamin D (CALCIUM-D) 600-400 MG-UNIT TABS Take 1 tablet by mouth 2 (two)  times daily.    Past Week at 2000  . Cyanocobalamin (VITAMIN B-12) 5000 MCG SUBL Place 5,000 mcg under the tongue daily.   Past Week at 0700  . denosumab (PROLIA) 60 MG/ML SOSY injection Inject 60 mg into the skin every 6 (six) months.   09/12/2017 at UNKNOWN  . digoxin (DIGOX) 0.25 MG tablet Take 0.5 tablets (0.125 mg total) by mouth daily. 90 tablet 1 Past Week at 0700  . diphenhydrAMINE (BENADRYL) 25 MG tablet Take 25 mg by mouth at bedtime as needed for allergies. Reported on 10/27/2015   PRN at PRN  . fluticasone (FLONASE) 50 MCG/ACT nasal spray USE 2 SPRAYS IN EACH NOSTRIL DAILY 48 g 1 PRN at PRN  . Leuprolide Acetate, 6 Month, (LUPRON DEPOT, 73-MONTH, IM) Inject into the muscle every 6 (six) months.   09/05/2017 at UNKNOWN  . metoprolol succinate (TOPROL-XL) 25 MG 24 hr tablet Take 1 tablet by mouth every evening.  Past Week at 2000  . Multiple Vitamin (MULTIVITAMIN WITH MINERALS) TABS tablet Take 1 tablet by mouth daily.   Past Week at 0700  . niacin (NIASPAN) 500 MG CR tablet TAKE ONE TABLET BY MOUTH ONCE DAILY 90 tablet 3 Past Week at 2000  . sertraline (ZOLOFT) 50 MG tablet Take 1 tablet by mouth daily.   Past Week at 2000  . traZODone (DESYREL) 100 MG tablet TAKE ONE TABLET BY MOUTH AT BEDTIME 90 tablet 1 Past Week at 2000  . albuterol (PROVENTIL HFA;VENTOLIN HFA) 108 (90 Base) MCG/ACT inhaler Inhale 2 puffs into the lungs every 6 (six) hours as needed for wheezing or shortness of breath. (Patient not taking: Reported on 09/12/2017) 1 Inhaler 0 Not Taking at Unknown time    Assessment: Pharmacy consulted for heparin drip dosing and monitoring. Patient was taking Eliquis PTA, last dose reported on 7/13 evening.     Goal of Therapy:  Heparin level 0.3-0.7 units/ml  APTT = 66 - 102  Monitor platelets by anticoagulation protocol: Yes   Plan:  07/16 @ 0500 HL 0.82, aPTT 74. HL supratherapeutic, aPTT therapeutic. Will continue current rate and will continue to dose off of aPTT until both  levels correlate. Will recheck aPTT/HL w/ am labs. CBC stable.  Tobie Lords, PharmD, BCPS Clinical Pharmacist 11/14/2017

## 2017-11-14 NOTE — Progress Notes (Signed)
[facility] [place] NAME: Charles Cook    MR#:  [id number]  DATE OF BIRTH:  04-02-1930  SUBJECTIVE:  CHIEF COMPLAINT:   Chief Complaint  Patient presents with  . Abdominal Pain   -With acute cholecystitis, due to high cardiac risk underwent percutaneous cholecystostomy last evening. -Dyspneic and hypoxic after the procedure, improved with fluids -Complains of some abdominal pain but improving  REVIEW OF SYSTEMS:  Review of Systems  Constitutional: Positive for malaise/fatigue. Negative for chills and fever.  HENT: Negative for ear discharge, hearing loss and nosebleeds.   Eyes: Negative for blurred vision and double vision.  Respiratory: Negative for cough, shortness of breath and wheezing.   Cardiovascular: Negative for chest pain and palpitations.  Gastrointestinal: Positive for abdominal pain and nausea. Negative for constipation, diarrhea and vomiting.  Genitourinary: Negative for dysuria.  Musculoskeletal: Negative for myalgias.  Neurological: Negative for dizziness, seizures, weakness and headaches.  Psychiatric/Behavioral: Negative for depression.    DRUG ALLERGIES:   Allergies  Allergen Reactions  . Moxifloxacin Hcl In Nacl Other (See Comments)    Reaction:  Confusion     VITALS:  Blood pressure (!) 110/44, pulse (!) 58, temperature 98.8 F (37.1 C), temperature source Oral, resp. rate 14, height 6' (1.829 m), weight 91.5 kg (201 lb 11.2 oz), SpO2 95 %.  PHYSICAL EXAMINATION:  Physical Exam  GENERAL:  84 y.o.-year-old elderly patient lying in the bed with no acute distress.  EYES: Pupils equal, round, reactive to light and accommodation. No scleral icterus. Extraocular muscles intact.  HEENT: Head atraumatic, normocephalic. Oropharynx and nasopharynx clear.  NECK:  Supple, no jugular venous distention. No thyroid enlargement, no tenderness.  LUNGS: Normal breath sounds bilaterally, no wheezing, rales,rhonchi or  crepitation. No use of accessory muscles of respiration. Decreased bibasilar breath sounds CARDIOVASCULAR: S1, S2 normal. No rubs, or gallops. 2/6 systolic murmur  ABDOMEN: Soft, RUQ abdominal pain, nondistended. Bowel sounds present. No organomegaly or mass.  EXTREMITIES: No pedal edema, cyanosis, or clubbing.  NEUROLOGIC: Cranial nerves II through XII are intact. Muscle strength 5/5 in all extremities. Sensation intact. Gait not checked. Global weakness noted PSYCHIATRIC: The patient is alert and oriented x 3.  SKIN: No obvious rash, lesion, or ulcer.    LABORATORY PANEL:   CBC Recent Labs  Lab 11/14/17 0443  WBC 14.1*  HGB 10.7*  HCT 30.6*  PLT 152   ------------------------------------------------------------------------------------------------------------------  Chemistries  Recent Labs  Lab 11/12/17 0855  11/14/17 0443  NA 130*   < > 128*  K 4.2   < > 4.6  CL 94*   < > 94*  CO2 28   < > 26  GLUCOSE 150*   < > 110*  BUN 14   < > 25*  CREATININE 0.88   < > 1.75*  CALCIUM 8.8*   < > 7.3*  AST 38  --   --   ALT 38  --   --   ALKPHOS 60  --   --   BILITOT 1.0  --   --    < > = values in this interval not displayed.   ------------------------------------------------------------------------------------------------------------------  Cardiac Enzymes No results for input(s): TROPONINI in the last 168 hours. ------------------------------------------------------------------------------------------------------------------  RADIOLOGY:  Dg Chest 1 View  Result Date: 11/13/2017 CLINICAL DATA:  Wheezing involving both lungs, audible at auscultation. Percutaneous cholecystostomy was performed earlier this afternoon. EXAM: Portable CHEST 1 VIEW COMPARISON:  11/12/2017, 06/17/2017 and earlier. FINDINGS: Sternotomy for  CABG. Cardiac silhouette moderately enlarged, unchanged. Atelectasis involving the lung bases, unchanged since yesterday. No new pulmonary parenchymal  abnormalities. Pulmonary vascularity normal. Small BILATERAL pleural effusions, unchanged. Remote healed fractures involving the RIGHT LATERAL fifth and sixth ribs and likely the LEFT LATERAL fourth and fifth ribs. IMPRESSION: 1. Bibasilar atelectasis and small BILATERAL pleural effusions, stable since yesterday's examination. 2. Stable cardiomegaly without evidence of pulmonary edema. 3. No new abnormalities. Electronically Signed   By: Evangeline Dakin M.D.   On: 11/13/2017 17:20   Ct Abdomen Pelvis W Contrast  Result Date: 11/12/2017 CLINICAL DATA:  Pt states "I'm unable to satisfy my hunger." states pressure in upper abd. Pt states he can't eat even though he's hungry. Symptoms x 1 week. Some NANDV. Denies diarrhea and constipation. History of prostate cancer. EXAM: CT ABDOMEN AND PELVIS WITH CONTRAST TECHNIQUE: Multidetector CT imaging of the abdomen and pelvis was performed using the standard protocol following bolus administration of intravenous contrast. CONTRAST:  134mL OMNIPAQUE IOHEXOL 300 MG/ML SOLN, 52mL ISOVUE-300 IOPAMIDOL (ISOVUE-300) INJECTION 61% COMPARISON:  CT of the abdomen and pelvis 03/29/2016 FINDINGS: Lower chest: There is bibasilar atelectasis or scarring. The heart is mildly enlarged. There is coronary artery calcification. Median sternotomy and CABG. Hepatobiliary: Gallbladder is distended and contains numerous faint stones, bearing in size from 0.6-2.0 centimeters. There is mild pericholecystic stranding and a small amount of perihepatic fluid. Mild dilatation of the intrahepatic ducts. Pancreas: Within the mid body of the pancreas there is a low-attenuation lesion measuring 1.8 centimeters and stable in appearance. A similar low-attenuation lesion is identified in the pancreatic tail, measuring 8 millimeters. Spleen: Normal in size without focal abnormality. Adrenals/Urinary Tract: Adrenal glands are normal in appearance. Symmetric enhancement and excretion from both kidneys. No  hydronephrosis or ureteral obstruction. Urinary bladder is normal in appearance. Stomach/Bowel: Stomach and small bowel loops are normal in appearance. The colon is normal in appearance. The appendix is well seen and has a normal appearance. Vascular/Lymphatic: There is dense atherosclerotic calcification not associated of the abdominal aorta with aneurysm. Although involved by atherosclerosis, there is vascular opacification of the celiac axis, superior mesenteric artery, and inferior mesenteric artery. Normal appearance of the portal venous system and inferior vena cava. No significant adenopathy. 1.7 centimeter LEFT external iliac lymph node is no longer enlarged. Reproductive: Surgical clips are identified in the region of the prostate gland. Seminal vesicles are normal in appearance. Other: There is a small amount of free pelvic fluid. Anterior abdominal wall is unremarkable. Musculoskeletal: Degenerative changes are seen at L5-S1. No suspicious lytic or blastic lesions are identified. IMPRESSION: 1. Distended gallbladder with surrounding inflammatory changes, perihepatic fluid, and gallstones, suspicious for acute cholecystitis. 2. Cardiomegaly.  Sternotomy.  Coronary artery disease. 3. Stable appearance of cystic lesions within the pancreatic body and tail. Follow-up CT of the abdomen with pancreatic protocol recommended in 2 years. 4. Normal appendix. 5.  Aortic atherosclerosis.  (ICD10-I70.0) 6. Prostatectomy. Electronically Signed   By: Nolon Nations M.D.   On: 11/12/2017 13:04   Ct Perc Cholecystostomy  Result Date: 11/13/2017 INDICATION: 84 year old male with acute calculus cholecystitis. He is a poor operative candidate and presents for percutaneous cholecystostomy tube placement EXAM: Percutaneous cholecystostomy tube placement with CT guidance MEDICATIONS: In patient currently receiving intravenous Zosyn. No additional antibiotic prophylaxis was administered. ANESTHESIA/SEDATION: Moderate  (conscious) sedation was employed during this procedure. A total of Versed 3 mg and Fentanyl 100 mcg was administered intravenously. Moderate Sedation Time: 26 minutes. The patient's level of consciousness and  vital signs were monitored continuously by radiology nursing throughout the procedure under my direct supervision. FLUOROSCOPY TIME:  Fluoroscopy Time: 0 minutes 0 seconds (0 mGy). COMPLICATIONS: None immediate. PROCEDURE: Informed written consent was obtained from the patient after a thorough discussion of the procedural risks, benefits and alternatives. All questions were addressed. A timeout was performed prior to the initiation of the procedure. A planning axial CT scan was performed. The gallbladder is distended. Peripherally calcified stones are present in the gallbladder neck. There is pericholecystic fluid and gallbladder wall thickening. Additionally, there is a developing likely symptomatic right-sided pleural effusion. A suitable skin entry site was selected and marked. The skin was then sterilely prepped and draped in the standard fashion using chlorhexidine skin prep. Local anesthesia was attained by infiltration with 1% lidocaine. A small dermatotomy was made. Under intermittent CT guidance, an 18 gauge trocar needle was carefully advanced along a short transhepatic course and into the gallbladder lumen. A wire was then advanced in the gallbladder lumen. The tract was dilated to 10 [place] and a Cook 10.2 [place] all-purpose drainage catheter was advanced over the wire and formed. There was free return of dark black bile. A sample was obtained and sent for Gram stain and culture. The tube was then connected to gravity bag drainage and secured to the skin with a combination of 0 Prolene suture and adhesive fixation device. Overall, the patient tolerated the procedure well. IMPRESSION: Placement of a 10 French transhepatic percutaneous cholecystostomy tube for the indication of acute calculus  cholecystitis in a poor operative candidate. PLAN: 1. Continue follow-up with general surgery to evaluate options for elective cholecystectomy in the future. 2. Patient can follow-up with Interventional Radiology in 4-6 weeks for cholecystostomy tube check, cholangiogram and tube exchange. Signed, Criselda Peaches, MD [facility] Electronically Signed   By: Jacqulynn Cadet M.D.   On: 11/13/2017 15:26    EKG:   Orders placed or performed during the hospital encounter of 11/12/17  . EKG 12-Lead  . EKG 12-Lead  . ED EKG  . ED EKG    ASSESSMENT AND PLAN:   84 year old male with past medical history significant for CAD status post CABG, atrial fibrillation on Eliquis, history of prostate cancer on treatment, hypertension, congestive heart failure with last EF of 35% brought to hospital secondary to right upper quadrant abdominal pain and nausea.  1.  Acute cholecystitis-known history of gallstones.  Appreciate surgical consult.  And underwent percutaneous cholecystostomy tube placement. -On Zosyn currently.  Can be transitioned to oral antibiotics tomorrow -WBC is improving.  Clinically improving. If no plans for further surgical removal of gallbladder this admission, will restart his Eliquis. -Awaiting to hear from surgery  2.  Acute renal failure-ATN likely and also prerenal causes.  Received fluids yesterday and Lasix last evening for dyspnea. -Known history of congestive heart failure.  Will avoid IV fluids and Lasix today and continue to monitor his urine output  3.  Hyponatremia-known history of chronic hyponatremia Baseline sodium is 129.   -EF is only 30%.  Has known chronic CHF.  Received Lasix yesterday with improvement noted in sodium today.  Continue to monitor closely without any fluids or diuretics today  4.  Permanent atrial fibrillation-continue amiodarone and digoxin -Also on metoprolol.  Eliquis on hold and  receiving heparin drip.  If surgery confirms that no further surgical procedures during this admission, will change to Eliquis orally  5.  CAD and ischemic cardiomyopathy-stable at this  time.  Active at baseline.  No chest pain -Continue aspirin, Toprol. -Appreciate cardiology consult  6.  DVT prophylaxis-on heparin drip  Physical therapy today   All the records are reviewed and case discussed with Care Management/Social Workerr. Management plans discussed with the patient, family and they are in agreement.  CODE STATUS: Full Code  TOTAL TIME TAKING CARE OF THIS PATIENT: 38 minutes.   POSSIBLE D/C IN 1-2 DAYS, DEPENDING ON CLINICAL CONDITION.   Gladstone Lighter M.D on 11/14/2017 at 12:27 PM  Between 7am to 6pm - Pager - [phone]  After 6pm go to [email or web address] - password [email or web address] [facility]  Office  [phone]  CC: Primary care physician; Mar Daring, PA-C

## 2017-11-14 NOTE — Progress Notes (Signed)
11/14/2017  Subjective: Patient reports his pain has improved significantly compared to yesterday.  No problems with perc chole tube placement.  Cultures pending.  Denies any nausea or vomiting.  Required lasix yesterday for some pleural effusion and decreased urine output, though his Cr today bumped to 1.75  Vital signs: Temp:  [97.3 F (36.3 C)-99 F (37.2 C)] 99 F (37.2 C) (07/16 0803) Pulse Rate:  [55-67] 63 (07/16 0803) Resp:  [14-20] 14 (07/16 0803) BP: (102-155)/(46-77) 133/54 (07/16 0803) SpO2:  [90 %-99 %] 96 % (07/16 0803) Weight:  [192 lb (87.1 kg)-201 lb 11.2 oz (91.5 kg)] 201 lb 11.2 oz (91.5 kg) (07/16 0440)   Intake/Output: 07/15 0701 - 07/16 0700 In: 5 [I.V.:5] Out: 27 [Urine:900; Drains:170] Last BM Date: 11/10/17  Physical Exam: Constitutional: No acute distress Abdomen:  Soft, nondistended, with improved tenderness in the RUQ.  Negative Murphy's sign.  Has focal tenderness at drain insertion site.  Perc chole drain in place, with bilious / sanguinous fluid in bag.  Labs:  Recent Labs    11/13/17 0120 11/14/17 0443  WBC 17.7* 14.1*  HGB 10.6* 10.7*  HCT 30.4* 30.6*  PLT 163 152   Recent Labs    11/13/17 0120 11/14/17 0443  NA 126* 128*  K 4.0 4.6  CL 95* 94*  CO2 25 26  GLUCOSE 122* 110*  BUN 14 25*  CREATININE 0.97 1.75*  CALCIUM 7.3* 7.3*   Recent Labs    11/12/17 1550  LABPROT 13.3  INR 1.02    Imaging: Dg Chest 1 View  Result Date: 11/13/2017 CLINICAL DATA:  Wheezing involving both lungs, audible at auscultation. Percutaneous cholecystostomy was performed earlier this afternoon. EXAM: Portable CHEST 1 VIEW COMPARISON:  11/12/2017, 06/17/2017 and earlier. FINDINGS: Sternotomy for CABG. Cardiac silhouette moderately enlarged, unchanged. Atelectasis involving the lung bases, unchanged since yesterday. No new pulmonary parenchymal abnormalities. Pulmonary vascularity normal. Small BILATERAL pleural effusions, unchanged. Remote healed  fractures involving the RIGHT LATERAL fifth and sixth ribs and likely the LEFT LATERAL fourth and fifth ribs. IMPRESSION: 1. Bibasilar atelectasis and small BILATERAL pleural effusions, stable since yesterday's examination. 2. Stable cardiomegaly without evidence of pulmonary edema. 3. No new abnormalities. Electronically Signed   By: Evangeline Dakin M.D.   On: 11/13/2017 17:20   Ct Perc Cholecystostomy  Result Date: 11/13/2017 INDICATION: 84 year old male with acute calculus cholecystitis. He is a poor operative candidate and presents for percutaneous cholecystostomy tube placement EXAM: Percutaneous cholecystostomy tube placement with CT guidance MEDICATIONS: In patient currently receiving intravenous Zosyn. No additional antibiotic prophylaxis was administered. ANESTHESIA/SEDATION: Moderate (conscious) sedation was employed during this procedure. A total of Versed 3 mg and Fentanyl 100 mcg was administered intravenously. Moderate Sedation Time: 26 minutes. The patient's level of consciousness and vital signs were monitored continuously by radiology nursing throughout the procedure under my direct supervision. FLUOROSCOPY TIME:  Fluoroscopy Time: 0 minutes 0 seconds (0 mGy). COMPLICATIONS: None immediate. PROCEDURE: Informed written consent was obtained from the patient after a thorough discussion of the procedural risks, benefits and alternatives. All questions were addressed. A timeout was performed prior to the initiation of the procedure. A planning axial CT scan was performed. The gallbladder is distended. Peripherally calcified stones are present in the gallbladder neck. There is pericholecystic fluid and gallbladder wall thickening. Additionally, there is a developing likely symptomatic right-sided pleural effusion. A suitable skin entry site was selected and marked. The skin was then sterilely prepped and draped in the standard fashion using chlorhexidine skin prep.  Local anesthesia was attained by  infiltration with 1% lidocaine. A small dermatotomy was made. Under intermittent CT guidance, an 18 gauge trocar needle was carefully advanced along a short transhepatic course and into the gallbladder lumen. A wire was then advanced in the gallbladder lumen. The tract was dilated to 10 [place] and a Cook 10.2 [place] all-purpose drainage catheter was advanced over the wire and formed. There was free return of dark black bile. A sample was obtained and sent for Gram stain and culture. The tube was then connected to gravity bag drainage and secured to the skin with a combination of 0 Prolene suture and adhesive fixation device. Overall, the patient tolerated the procedure well. IMPRESSION: Placement of a 10 French transhepatic percutaneous cholecystostomy tube for the indication of acute calculus cholecystitis in a poor operative candidate. PLAN: 1. Continue follow-up with general surgery to evaluate options for elective cholecystectomy in the future. 2. Patient can follow-up with Interventional Radiology in 4-6 weeks for cholecystostomy tube check, cholangiogram and tube exchange. Signed, Criselda Peaches, MD [facility]iology Electronically Signed   By: Jacqulynn Cadet M.D.   On: 11/13/2017 15:26    Assessment/Plan: 84 yo male with cholecystitis, s/p perc chole tube  --continue IV antibiotics.  His WBC is improving, but would wait until normalized prior to changing to po --agree with physical therapy consult due to weakened physical condition and hx of falls --continue regular diet as tolerated --repeat CBC and [place], [place]

## 2017-11-15 LAB — GLUCOSE, CAPILLARY: Glucose-Capillary: 100 mg/dL — ABNORMAL HIGH (ref 70–99)

## 2017-11-15 LAB — CBC WITH DIFFERENTIAL/PLATELET
Basophils Absolute: 0 10*3/uL (ref 0–0.1)
Basophils Relative: 0 %
Eosinophils Absolute: 1 10*3/uL — ABNORMAL HIGH (ref 0–0.7)
Eosinophils Relative: 10 %
HCT: 31.2 % — ABNORMAL LOW (ref 40.0–52.0)
Hemoglobin: 10.9 g/dL — ABNORMAL LOW (ref 13.0–18.0)
Lymphocytes Relative: 9 %
Lymphs Abs: 0.9 10*3/uL — ABNORMAL LOW (ref 1.0–3.6)
MCH: 34.3 pg — ABNORMAL HIGH (ref 26.0–34.0)
MCHC: 35 g/dL (ref 32.0–36.0)
MCV: 98 fL (ref 80.0–100.0)
Monocytes Absolute: 1 10*3/uL (ref 0.2–1.0)
Monocytes Relative: 10 %
Neutro Abs: 7 10*3/uL — ABNORMAL HIGH (ref 1.4–6.5)
Neutrophils Relative %: 71 %
Platelets: 188 10*3/uL (ref 150–440)
RBC: 3.18 MIL/uL — ABNORMAL LOW (ref 4.40–5.90)
RDW: 13.2 % (ref 11.5–14.5)
WBC: 9.8 10*3/uL (ref 3.8–10.6)

## 2017-11-15 LAB — BASIC METABOLIC PANEL
Anion gap: 7 (ref 5–15)
BUN: 29 mg/dL — ABNORMAL HIGH (ref 8–23)
CO2: 26 mmol/L (ref 22–32)
Calcium: 7.6 mg/dL — ABNORMAL LOW (ref 8.9–10.3)
Chloride: 95 mmol/L — ABNORMAL LOW (ref 98–111)
Creatinine, Ser: 1.73 mg/dL — ABNORMAL HIGH (ref 0.61–1.24)
GFR calc Af Amer: 40 mL/min — ABNORMAL LOW (ref >60)
GFR calc non Af Amer: 34 mL/min — ABNORMAL LOW (ref >60)
Glucose, Bld: 94 mg/dL (ref 70–99)
Potassium: 3.8 mmol/L (ref 3.5–5.1)
Sodium: 128 mmol/L — ABNORMAL LOW (ref 135–145)

## 2017-11-15 LAB — HEPARIN LEVEL (UNFRACTIONATED): Heparin Unfractionated: 0.7 IU/mL (ref 0.30–0.70)

## 2017-11-15 LAB — APTT: aPTT: 75 seconds — ABNORMAL HIGH (ref 24–36)

## 2017-11-15 MED ORDER — APIXABAN 2.5 MG PO TABS
2.5000 mg | ORAL_TABLET | Freq: Two times a day (BID) | ORAL | Status: DC
  Administered 2017-11-15: 2.5 mg via ORAL
  Filled 2017-11-15: qty 1

## 2017-11-15 MED ORDER — AMOXICILLIN-POT CLAVULANATE 875-125 MG PO TABS: 1.0000 | ORAL_TABLET | Freq: Two times a day (BID) | ORAL | 0 refills | Status: AC

## 2017-11-15 MED ORDER — AMOXICILLIN-POT CLAVULANATE 875-125 MG PO TABS
1.0000 | ORAL_TABLET | Freq: Two times a day (BID) | ORAL | Status: DC
  Administered 2017-11-15: 1 via ORAL
  Filled 2017-11-15: qty 1

## 2017-11-15 MED ORDER — BISACODYL 10 MG RE SUPP
10.0000 mg | Freq: Once | RECTAL | Status: AC
  Administered 2017-11-15: 10 mg via RECTAL
  Filled 2017-11-15: qty 1

## 2017-11-15 NOTE — Progress Notes (Signed)
CC: CholeCystitis Subjective: Doing much better.  Tolerating some diet.  No fevers no chills abdominal pain significantly improved.  Cholecystostomy tube placed yesterday  Objective: Vital signs in last 24 hours: Temp:  [98.4 F (36.9 C)-99.2 F (37.3 C)] 98.4 F (36.9 C) (07/17 0744) Pulse Rate:  [58-74] 63 (07/17 0744) Resp:  [14-20] 15 (07/17 0744) BP: (110-187)/(44-66) 139/55 (07/17 0744) SpO2:  [89 %-96 %] 96 % (07/17 0744) Weight:  [87.6 kg (193 lb 2 oz)] 87.6 kg (193 lb 2 oz) (07/17 0453) Last BM Date: 11/14/17  Intake/Output from previous day: 07/16 0701 - 07/17 0700 In: 784.4 [P.O.:358; I.V.:316.4; IV Piggyback:100] Out: 1930 [NLZJQ:7341; Drains:175] Intake/Output this shift: Total I/O In: 207.2 [I.V.:149.4; IV Piggyback:57.8] Out: 400 [Urine:400]  Physical exam: NAD, awake and alert Abd: soft, NT, chole tube in place , no peritonitis Ext: no edema and   Lab Results: CBC  Recent Labs    11/14/17 0443 11/15/17 0443  WBC 14.1* 9.8  HGB 10.7* 10.9*  HCT 30.6* 31.2*  PLT 152 188   BMET Recent Labs    11/14/17 0443 11/15/17 0443  NA 128* 128*  K 4.6 3.8  CL 94* 95*  CO2 26 26  GLUCOSE 110* 94  BUN 25* 29*  CREATININE 1.75* 1.73*  CALCIUM 7.3* 7.6*   PT/INR Recent Labs    11/12/17 1550  LABPROT 13.3  INR 1.02   ABG No results for input(s): PHART, HCO3 in the last 72 hours.  Invalid input(s): PCO2, PO2  Studies/Results: Dg Chest 1 View  Result Date: 11/13/2017 CLINICAL DATA:  Wheezing involving both lungs, audible at auscultation. Percutaneous cholecystostomy was performed earlier this afternoon. EXAM: Portable CHEST 1 VIEW COMPARISON:  11/12/2017, 06/17/2017 and earlier. FINDINGS: Sternotomy for CABG. Cardiac silhouette moderately enlarged, unchanged. Atelectasis involving the lung bases, unchanged since yesterday. No new pulmonary parenchymal abnormalities. Pulmonary vascularity normal. Small BILATERAL pleural effusions, unchanged. Remote  healed fractures involving the RIGHT LATERAL fifth and sixth ribs and likely the LEFT LATERAL fourth and fifth ribs. IMPRESSION: 1. Bibasilar atelectasis and small BILATERAL pleural effusions, stable since yesterday's examination. 2. Stable cardiomegaly without evidence of pulmonary edema. 3. No new abnormalities. Electronically Signed   By: Evangeline Dakin M.D.   On: 11/13/2017 17:20   Ct Perc Cholecystostomy  Result Date: 11/13/2017 INDICATION: 84 year old male with acute calculus cholecystitis. He is a poor operative candidate and presents for percutaneous cholecystostomy tube placement EXAM: Percutaneous cholecystostomy tube placement with CT guidance MEDICATIONS: In patient currently receiving intravenous Zosyn. No additional antibiotic prophylaxis was administered. ANESTHESIA/SEDATION: Moderate (conscious) sedation was employed during this procedure. A total of Versed 3 mg and Fentanyl 100 mcg was administered intravenously. Moderate Sedation Time: 26 minutes. The patient's level of consciousness and vital signs were monitored continuously by radiology nursing throughout the procedure under my direct supervision. FLUOROSCOPY TIME:  Fluoroscopy Time: 0 minutes 0 seconds (0 mGy). COMPLICATIONS: None immediate. PROCEDURE: Informed written consent was obtained from the patient after a thorough discussion of the procedural risks, benefits and alternatives. All questions were addressed. A timeout was performed prior to the initiation of the procedure. A planning axial CT scan was performed. The gallbladder is distended. Peripherally calcified stones are present in the gallbladder neck. There is pericholecystic fluid and gallbladder wall thickening. Additionally, there is a developing likely symptomatic right-sided pleural effusion. A suitable skin entry site was selected and marked. The skin was then sterilely prepped and draped in the standard fashion using chlorhexidine skin prep. Local anesthesia was  attained by infiltration with 1% lidocaine. A small dermatotomy was made. Under intermittent CT guidance, an 18 gauge trocar needle was carefully advanced along a short transhepatic course and into the gallbladder lumen. A wire was then advanced in the gallbladder lumen. The tract was dilated to 10 [place] and a Cook 10.2 [place] all-purpose drainage catheter was advanced over the wire and formed. There was free return of dark black bile. A sample was obtained and sent for Gram stain and culture. The tube was then connected to gravity bag drainage and secured to the skin with a combination of 0 Prolene suture and adhesive fixation device. Overall, the patient tolerated the procedure well. IMPRESSION: Placement of a 10 French transhepatic percutaneous cholecystostomy tube for the indication of acute calculus cholecystitis in a poor operative candidate. PLAN: 1. Continue follow-up with general surgery to evaluate options for elective cholecystectomy in the future. 2. Patient can follow-up with Interventional Radiology in 4-6 weeks for cholecystostomy tube check, cholangiogram and tube exchange. Signed, Criselda Peaches, MD [facility] Radiology Electronically Signed   By: Jacqulynn Cadet M.D.   On: 11/13/2017 15:26    Anti-infectives: Anti-infectives (From admission, onward)   Start     Dose/Rate Route Frequency Ordered Stop   11/12/17 1400  piperacillin-tazobactam (ZOSYN) IVPB 3.375 g     3.375 g 12.5 mL/hr over 240 Minutes Intravenous Every 8 hours 11/12/17 1327        Assessment/Plan:  Cholecystitis resolving s/p Chole tube No plan surgical intervention during this hospitalization May DC on Augmentin or cipro and flagyl x 10 days Pt to go home w Chole tube RTC 3 weeks  Caroleen Hamman, MD, [facility]  11/15/2017

## 2017-11-15 NOTE — Care Management Note (Signed)
Case Management Note  Patient Details  Name: Charles Cook MRN: [id number] Date of Birth: August 08, 1929   Patient to discharge home today.  PCP [place].  Patient agreeable to home health services.  Does not have a preference of home health agency.  Referral made to [facility] with [place]. Patient to discharge to his daughters address [address] [place].   Patient has a RW in the home for ambulation.   RNCM signing off.  Subjective/Objective:                    Action/Plan:   Expected Discharge Date:  11/15/17               Expected Discharge Plan:  [place]  In-House Referral:     Discharge planning Services  CM Consult  Post Acute Care Choice:  Home Health Choice offered to:  Patient, Adult Children  DME Arranged:    DME Agency:     HH Arranged:  RN, PT [place] Agency:  [place]  Status of Service:  Completed, signed off  If discussed at [place] of Stay Meetings, dates discussed:    Additional Comments:  [name], RN 11/15/2017, 4:06 PM

## 2017-11-15 NOTE — Discharge Summary (Signed)
[facility] [place] NAME: Charles Cook    MR#:  [id number]  DATE OF BIRTH:  06-19-1929  DATE OF ADMISSION:  11/12/2017 ADMITTING PHYSICIAN: Epifanio Lesches, MD  DATE OF DISCHARGE: 11/15/2017  PRIMARY CARE PHYSICIAN: Mar Daring, PA-C    ADMISSION DIAGNOSIS:  Cholecystitis [K81.9]  DISCHARGE DIAGNOSIS:  Acute Cholecystitis s/p cholecystostomy drain by IR on 11/13/2017 Acute Renal failure suspected due to ATN SECONDARY DIAGNOSIS:   Past Medical History:  Diagnosis Date  . Atrial fibrillation ([place])    on eliquis  . Bladder cancer ([place])   . BPH (benign prostatic hyperplasia)   . CAD (coronary artery disease)    s/p CABG in 2006  . Cardiomyopathy ([place])   . Depression    controlled;   . Hiatal hernia   . Hx MRSA infection   . Hyperlipidemia   . Hypertension   . Prostate cancer [facility])     HOSPITAL COURSE:  84 year old male with past medical history significant for CAD status post CABG, atrial fibrillation on Eliquis, history of prostate cancer on treatment, hypertension, congestive heart failure with last EF of 35% brought to hospital secondary to right upper quadrant abdominal pain and nausea.  1.  Acute cholecystitis-known history of gallstones.  Appreciate surgical consult with Dr pabon.  And underwent percutaneous cholecystostomy tube placement on July 15th 2019 by IR -On Zosyn currently--change to po augmentin -WBC is improving.  Clinically improving. -per Dr Dahlia Byes no plans for further surgical removal of gallbladder this admission, will restart his Eliquis.  2.  Acute renal failure-ATN likely and also prerenal causes.  - Received fluids yesterday and Lasix last evening for dyspnea. -Known history of congestive heart failure.  Will avoid IV fluids and Lasix today and continue to monitor his urine output -creatinine trending down. Have asked dter to get BMP checked as out pt next week  3.   Hyponatremia-known history of chronic hyponatremia Baseline sodium is 129.   -EF is only 30%.  Has known chronic CHF.  Received Lasix yesterday with improvement noted in sodium today.  Continue to monitor closely without any fluids or diuretics today  4. Chronic atrial fibrillation-continue amiodarone and digoxin -Also on metoprolol.  Eliquis now resumed  5.  CAD and ischemic cardiomyopathy-stable at this time.  Active at baseline.  No chest pain -Continue aspirin, Toprol. -Appreciate cardiology consult  6.  DVT prophylaxis-on eliquis  Pt ambulated well with PT D/c home with HHPT/RN. Spoke with pt and dter.    CONSULTS OBTAINED:  Treatment Team:  Yolonda Kida, MD  DRUG ALLERGIES:   Allergies  Allergen Reactions  . Moxifloxacin Hcl In Nacl Other (See Comments)    Reaction:  Confusion     DISCHARGE MEDICATIONS:   Allergies as of 11/15/2017      Reactions   Moxifloxacin Hcl In Nacl Other (See Comments)   Reaction:  Confusion       Medication List    STOP taking these medications   albuterol 108 (90 Base) MCG/ACT inhaler Commonly known as:  PROVENTIL HFA;VENTOLIN HFA     TAKE these medications   amiodarone 200 MG tablet Commonly known as:  PACERONE Take 200 mg by mouth daily.   amoxicillin-clavulanate 875-125 MG tablet Commonly known as:  AUGMENTIN Take 1 tablet by mouth every 12 (twelve) hours for 7 days.   apixaban 2.5 MG Tabs tablet Commonly known as:  ELIQUIS Take 1 tablet (2.5 mg total) by mouth 2 (two)  times daily.   aspirin EC 81 MG tablet Take 81 mg by mouth at bedtime.   Calcium-D 600-400 MG-UNIT Tabs Take 1 tablet by mouth 2 (two) times daily.   denosumab 60 MG/ML Sosy injection Commonly known as:  PROLIA Inject 60 mg into the skin every 6 (six) months.   digoxin 0.25 MG tablet Commonly known as:  DIGOX Take 0.5 tablets (0.125 mg total) by mouth daily.   diphenhydrAMINE 25 MG tablet Commonly known as:  BENADRYL Take 25 mg by  mouth at bedtime as needed for allergies. Reported on 10/27/2015   fluticasone 50 MCG/ACT nasal spray Commonly known as:  FLONASE USE 2 SPRAYS IN EACH NOSTRIL DAILY   LUPRON DEPOT (51-MONTH) IM Inject into the muscle every 6 (six) months.   metoprolol succinate 25 MG 24 hr tablet Commonly known as:  TOPROL-XL Take 1 tablet by mouth every evening.   multivitamin with minerals Tabs tablet Take 1 tablet by mouth daily.   niacin 500 MG CR tablet Commonly known as:  NIASPAN TAKE ONE TABLET BY MOUTH ONCE DAILY   sertraline 50 MG tablet Commonly known as:  ZOLOFT Take 1 tablet by mouth daily.   traZODone 100 MG tablet Commonly known as:  DESYREL TAKE ONE TABLET BY MOUTH AT BEDTIME   Vitamin B-12 5000 MCG Subl Place 5,000 mcg under the tongue daily.       If you experience worsening of your admission symptoms, develop shortness of breath, life threatening emergency, suicidal or homicidal thoughts you must seek medical attention immediately by calling 911 or calling your MD immediately  if symptoms less severe.  You Must read complete instructions/literature along with all the possible adverse reactions/side effects for all the Medicines you take and that have been prescribed to you. Take any new Medicines after you have completely understood and accept all the possible adverse reactions/side effects.   Please note  You were cared for by a hospitalist during your hospital stay. If you have any questions about your discharge medications or the care you received while you were in the hospital after you are discharged, you can call the unit and asked to speak with the hospitalist on call if the hospitalist that took care of you is not available. Once you are discharged, your primary care physician will handle any further medical issues. Please note that NO REFILLS for any discharge medications will be authorized once you are discharged, as it is imperative that you return to your primary  care physician (or establish a relationship with a primary care physician if you do not have one) for your aftercare needs so that they can reassess your need for medications and monitor your lab values. Today   SUBJECTIVE    Doing well. Wants to go home  Had small BM per RN yday ambulated well VITAL SIGNS:  Blood pressure (!) 137/49, pulse (!) 59, temperature 98.1 F (36.7 C), temperature source Oral, resp. rate 15, height 6' (1.829 m), weight 87.6 kg (193 lb 2 oz), SpO2 93 %.  I/O:    Intake/Output Summary (Last 24 hours) at 11/15/2017 1256 Last data filed at 11/15/2017 1100 Gross per 24 hour  Intake 986.65 ml  Output 2280 ml  Net -1293.35 ml    PHYSICAL EXAMINATION:  GENERAL:  84 y.o.-year-old patient lying in the bed with no acute distress.  EYES: Pupils equal, round, reactive to light and accommodation. No scleral icterus. Extraocular muscles intact.  HEENT: Head atraumatic, normocephalic. Oropharynx and nasopharynx clear.  NECK:  Supple, no jugular venous distention. No thyroid enlargement, no tenderness.  LUNGS: Normal breath sounds bilaterally, no wheezing, rales,rhonchi or crepitation. No use of accessory muscles of respiration.  CARDIOVASCULAR: S1, S2 normal. No murmurs, rubs, or gallops.  ABDOMEN: Soft, non-tender, non-distended. Bowel sounds present. No organomegaly or mass.  EXTREMITIES: No pedal edema, cyanosis, or clubbing. GB drain + NEUROLOGIC: Cranial nerves II through XII are intact. Muscle strength 5/5 in all extremities. Sensation intact. Gait not checked.  PSYCHIATRIC: The patient is alert and oriented x 3.  SKIN: No obvious rash, lesion, or ulcer.   DATA REVIEW:   CBC  Recent Labs  Lab 11/15/17 0443  WBC 9.8  HGB 10.9*  HCT 31.2*  PLT 188    Chemistries  Recent Labs  Lab 11/12/17 0855  11/15/17 0443  NA 130*   < > 128*  K 4.2   < > 3.8  CL 94*   < > 95*  CO2 28   < > 26  GLUCOSE 150*   < > 94  BUN 14   < > 29*  CREATININE 0.88   < >  1.73*  CALCIUM 8.8*   < > 7.6*  AST 38  --   --   ALT 38  --   --   ALKPHOS 60  --   --   BILITOT 1.0  --   --    < > = values in this interval not displayed.    Microbiology Results   Recent Results (from the past 240 hour(s))  Body fluid culture     Status: None (Preliminary result)   Collection Time: 11/13/17  2:45 PM  Result Value Ref Range Status   Specimen Description   Final    BILE Performed at [facility], [address] [place], [place] [address]    Special Requests NONE  Final   Gram Stain   Final    RARE WBC PRESENT, PREDOMINANTLY PMN NO ORGANISMS SEEN    Culture   Final    NO GROWTH 2 DAYS Performed at [facility], [place] [address] [place], [place] [address]    Report Status PENDING  Incomplete    RADIOLOGY:  Dg Chest 1 View  Result Date: 11/13/2017 CLINICAL DATA:  Wheezing involving both lungs, audible at auscultation. Percutaneous cholecystostomy was performed earlier this afternoon. EXAM: Portable CHEST 1 VIEW COMPARISON:  11/12/2017, 06/17/2017 and earlier. FINDINGS: Sternotomy for CABG. Cardiac silhouette moderately enlarged, unchanged. Atelectasis involving the lung bases, unchanged since yesterday. No new pulmonary parenchymal abnormalities. Pulmonary vascularity normal. Small BILATERAL pleural effusions, unchanged. Remote healed fractures involving the RIGHT LATERAL fifth and sixth ribs and likely the LEFT LATERAL fourth and fifth ribs. IMPRESSION: 1. Bibasilar atelectasis and small BILATERAL pleural effusions, stable since yesterday's examination. 2. Stable cardiomegaly without evidence of pulmonary edema. 3. No new abnormalities. Electronically Signed   By: Evangeline Dakin M.D.   On: 11/13/2017 17:20   Ct Perc Cholecystostomy  Result Date: 11/13/2017 INDICATION: 84 year old male with acute calculus cholecystitis. He is a poor operative candidate and presents for percutaneous cholecystostomy tube placement EXAM: Percutaneous  cholecystostomy tube placement with CT guidance MEDICATIONS: In patient currently receiving intravenous Zosyn. No additional antibiotic prophylaxis was administered. ANESTHESIA/SEDATION: Moderate (conscious) sedation was employed during this procedure. A total of Versed 3 mg and Fentanyl 100 mcg was administered intravenously. Moderate Sedation Time: 26 minutes. The patient's level of consciousness and vital signs were monitored continuously by radiology nursing throughout the  procedure under my direct supervision. FLUOROSCOPY TIME:  Fluoroscopy Time: 0 minutes 0 seconds (0 mGy). COMPLICATIONS: None immediate. PROCEDURE: Informed written consent was obtained from the patient after a thorough discussion of the procedural risks, benefits and alternatives. All questions were addressed. A timeout was performed prior to the initiation of the procedure. A planning axial CT scan was performed. The gallbladder is distended. Peripherally calcified stones are present in the gallbladder neck. There is pericholecystic fluid and gallbladder wall thickening. Additionally, there is a developing likely symptomatic right-sided pleural effusion. A suitable skin entry site was selected and marked. The skin was then sterilely prepped and draped in the standard fashion using chlorhexidine skin prep. Local anesthesia was attained by infiltration with 1% lidocaine. A small dermatotomy was made. Under intermittent CT guidance, an 18 gauge trocar needle was carefully advanced along a short transhepatic course and into the gallbladder lumen. A wire was then advanced in the gallbladder lumen. The tract was dilated to 10 [place] and a Cook 10.2 [place] all-purpose drainage catheter was advanced over the wire and formed. There was free return of dark black bile. A sample was obtained and sent for Gram stain and culture. The tube was then connected to gravity bag drainage and secured to the skin with a combination of 0 Prolene suture and adhesive  fixation device. Overall, the patient tolerated the procedure well. IMPRESSION: Placement of a 10 French transhepatic percutaneous cholecystostomy tube for the indication of acute calculus cholecystitis in a poor operative candidate. PLAN: 1. Continue follow-up with general surgery to evaluate options for elective cholecystectomy in the future. 2. Patient can follow-up with Interventional Radiology in 4-6 weeks for cholecystostomy tube check, cholangiogram and tube exchange. Signed, Criselda Peaches, MD [facility] Radiology Electronically Signed   By: Jacqulynn Cadet M.D.   On: 11/13/2017 15:26     Management plans discussed with the patient, family and they are in agreement.  CODE STATUS:     Code Status Orders  (From admission, onward)        Start     Ordered   11/12/17 1449  Full code  Continuous     11/12/17 1450    Code Status History    Date Active Date Inactive Code Status Order ID Comments User Context   10/05/2015 2157 10/09/2015 1834 Full Code [id number]  Gladstone Lighter, MD ED    Advance Directive Documentation     Most Recent Value  Type of Advance Directive  Healthcare Power of Attorney  Pre-existing out of facility DNR order (yellow form or pink MOST form)  -  "MOST" Form in Place?  -      TOTAL TIME TAKING CARE OF THIS PATIENT: 40** minutes.    Fritzi Mandes M.D on 11/15/2017 at 12:56 PM  Between 7am to 6pm - Pager - [phone] After 6pm go to [email or web address] - password [email or web address] [facility]  Office  [phone]  CC: Primary care physician; Mar Daring, PA-C

## 2017-11-15 NOTE — Progress Notes (Signed)
Patient discharge teaching given, including activity, diet, follow-up appoints, and medications. RN went over how to flush bilary tube multiple times a day, how to change dressing around bilary tube, and how to empty gravity bag. Patient verbalized understanding of all discharge instructions. IV access was d/c'd. Vitals are stable. Skin is intact except as charted in most recent assessments. Pt to be escorted out by NT, to be driven home by family.  Manessa Buley [employer]

## 2017-11-15 NOTE — Progress Notes (Signed)
Physical Therapy Treatment Patient Details Name: Charles Cook MRN: [id number] DOB: 19-Jul-1929 Today's Date: 11/15/2017    History of Present Illness 84 year old male s/p  percutaneous cholecystostomy tube placement with CT guidance 11/13/17. PMH CAD status post CABG, atrial fibrillation on Eliquis, history of prostate cancer on treatment, hypertension, congestive heart failure with last EF of 35%.    PT Comments    Patient agreeable to therapy, no pain at start of session. Patient able to mobilize to EOB and transfer with supervision, ambulated ~243ft with RW and CGA. 1-2 instances of instability but patient able to Cook correct. Time spent educating patient about proper technique/hand placement for stand to sit transfers to better improve safety. The patient would benefit from further physical therapy to address changes from PLOF.     Follow Up Recommendations  Home health PT;Supervision/Assistance - 24 hour     Equipment Recommendations  None recommended by PT    Recommendations for Other Services       Precautions / Restrictions Precautions Precautions: Fall Restrictions Weight Bearing Restrictions: No    Mobility  Bed Mobility Overal bed mobility: Modified Independent Bed Mobility: Supine to Sit              Transfers Overall transfer level: Needs assistance Equipment used: Rolling walker (2 wheeled) Transfers: Sit to/from Stand Sit to Stand: Supervision            Ambulation/Gait Ambulation/Gait assistance: Min guard Gait Distance (Feet): 200 Feet Assistive device: Rolling walker (2 wheeled)       General Gait Details: Patient occasionally unsteady during ambulation, but no complaints of dizziness/fatigue   Stairs             Wheelchair Mobility    Modified Rankin (Stroke Patients Only)       Balance   Sitting-balance support: Feet supported Sitting balance-Leahy Scale: Good       Standing balance-Leahy Scale: Poor                               Cognition Arousal/Alertness: Awake/alert Behavior During Therapy: WFL for tasks assessed/performed                                          Exercises      General Comments        Pertinent Vitals/Pain Pain Assessment: No/denies pain    Home Living                      Prior Function            PT Goals (current goals can now be found in the care plan section) Acute Rehab PT Goals Patient Stated Goal: Patient would like to return home PT Goal Formulation: With patient Time For Goal Achievement: 11/28/17 Potential to Achieve Goals: Good Progress towards PT goals: Progressing toward goals    Frequency    Min 2X/week      PT Plan Current plan remains appropriate    Co-evaluation              AM-PAC PT "6 Clicks" Daily Activity  Outcome Measure  Difficulty turning over in bed (including adjusting bedclothes, sheets and blankets)?: A Little Difficulty moving from lying on back to sitting on the side of the bed? : A Little Difficulty sitting down on  and standing up from a chair with arms (e.g., wheelchair, bedside commode, etc,.)?: A Little Help needed moving to and from a bed to chair (including a wheelchair)?: A Little Help needed walking in hospital room?: A Little Help needed climbing 3-5 steps with a railing? : A Little 6 Click Score: 18    End of Session Equipment Utilized During Treatment: Gait belt Activity Tolerance: Patient tolerated treatment well Patient left: with chair alarm set;in chair;with family/visitor present;with call bell/phone within reach;with SCD's reapplied Nurse Communication: Mobility status PT Visit Diagnosis: Unsteadiness on feet (R26.81);Other abnormalities of gait and mobility (R26.89);Muscle weakness (generalized) (M62.81);Repeated falls (R29.6)     Time: 1594-5859 PT Time Calculation (min) (ACUTE ONLY): 25 min  Charges:  $Gait Training: 8-22 mins $Therapeutic  Activity: 8-22 mins                    G Codes:      Lieutenant Diego PT, DPT 1:02 PM,11/15/17 [phone]

## 2017-11-15 NOTE — Progress Notes (Signed)
ANTICOAGULATION CONSULT NOTE - Initial Consult  Pharmacy Consult for Heparin  Indication: atrial fibrillation  Allergies  Allergen Reactions  . Moxifloxacin Hcl In Nacl Other (See Comments)    Reaction:  Confusion     Patient Measurements: Height: 6' (182.9 cm) Weight: 193 lb 2 oz (87.6 kg) IBW/kg (Calculated) : 77.6 Heparin Dosing Weight:    Vital Signs: Temp: 98.9 F (37.2 C) (07/17 0453) Temp Source: Oral (07/17 0453) BP: 187/66 (07/17 0453) Pulse Rate: 74 (07/17 0453)  Labs: Recent Labs    11/12/17 1550 11/13/17 0120 11/13/17 0909 11/14/17 0443 11/15/17 0443  HGB  --  10.6*  --  10.7* 10.9*  HCT  --  30.4*  --  30.6* 31.2*  PLT  --  163  --  152 188  APTT 27 143* 77* 74* 75*  LABPROT 13.3  --   --   --   --   INR 1.02  --   --   --   --   HEPARINUNFRC 0.35 1.22*  --  0.82* 0.70  CREATININE  --  0.97  --  1.75* 1.73*    Estimated Creatinine Clearance: 34.3 mL/min (A) (by C-G formula based on SCr of 1.73 mg/dL (H)).   Medical History: Past Medical History:  Diagnosis Date  . Atrial fibrillation ([place])    on eliquis  . Bladder cancer ([place])   . BPH (benign prostatic hyperplasia)   . CAD (coronary artery disease)    s/p CABG in 2006  . Cardiomyopathy ([place])   . Depression    controlled;   . Hiatal hernia   . Hx MRSA infection   . Hyperlipidemia   . Hypertension   . Prostate cancer ([place])     Medications:  Medications Prior to Admission  Medication Sig Dispense Refill Last Dose  . amiodarone (PACERONE) 200 MG tablet Take 200 mg by mouth daily.    Past Week at 0700  . apixaban (ELIQUIS) 2.5 MG TABS tablet Take 1 tablet (2.5 mg total) by mouth 2 (two) times daily. 60 tablet 1 Past Week at 2000  . aspirin EC 81 MG tablet Take 81 mg by mouth at bedtime.   Past Week at 2000  . Calcium Carbonate-Vitamin D (CALCIUM-D) 600-400 MG-UNIT TABS Take 1 tablet by mouth 2 (two) times daily.    Past Week at 2000  . Cyanocobalamin (VITAMIN B-12) 5000 MCG SUBL Place  5,000 mcg under the tongue daily.   Past Week at 0700  . denosumab (PROLIA) 60 MG/ML SOSY injection Inject 60 mg into the skin every 6 (six) months.   09/12/2017 at UNKNOWN  . digoxin (DIGOX) 0.25 MG tablet Take 0.5 tablets (0.125 mg total) by mouth daily. 90 tablet 1 Past Week at 0700  . diphenhydrAMINE (BENADRYL) 25 MG tablet Take 25 mg by mouth at bedtime as needed for allergies. Reported on 10/27/2015   PRN at PRN  . fluticasone (FLONASE) 50 MCG/ACT nasal spray USE 2 SPRAYS IN EACH NOSTRIL DAILY 48 g 1 PRN at PRN  . Leuprolide Acetate, 6 Month, (LUPRON DEPOT, 65-MONTH, IM) Inject into the muscle every 6 (six) months.   09/05/2017 at UNKNOWN  . metoprolol succinate (TOPROL-XL) 25 MG 24 hr tablet Take 1 tablet by mouth every evening.    Past Week at 2000  . Multiple Vitamin (MULTIVITAMIN WITH MINERALS) TABS tablet Take 1 tablet by mouth daily.   Past Week at 0700  . niacin (NIASPAN) 500 MG CR tablet TAKE ONE TABLET BY MOUTH ONCE  DAILY 90 tablet 3 Past Week at 2000  . sertraline (ZOLOFT) 50 MG tablet Take 1 tablet by mouth daily.   Past Week at 2000  . traZODone (DESYREL) 100 MG tablet TAKE ONE TABLET BY MOUTH AT BEDTIME 90 tablet 1 Past Week at 2000  . albuterol (PROVENTIL HFA;VENTOLIN HFA) 108 (90 Base) MCG/ACT inhaler Inhale 2 puffs into the lungs every 6 (six) hours as needed for wheezing or shortness of breath. (Patient not taking: Reported on 09/12/2017) 1 Inhaler 0 Not Taking at Unknown time    Assessment: Pharmacy consulted for heparin drip dosing and monitoring. Patient was taking Eliquis PTA, last dose reported on 7/13 evening.     Goal of Therapy:  Heparin level 0.3-0.7 units/ml  APTT = 66 - 102  Monitor platelets by anticoagulation protocol: Yes   Plan:  07/16 @ 0500 HL 0.82, aPTT 74. HL supratherapeutic, aPTT therapeutic. Will continue current rate and will continue to dose off of aPTT until both levels correlate. Will recheck aPTT/HL w/ am labs. CBC stable.  07/17 @ 0500 aPTT 75,  HL 0.70. Both levels therapeutic and correlating. Will continue current rate and will recheck HL w/ am labs. CBC stable.  Tobie Lords, PharmD, BCPS Clinical Pharmacist 11/15/2017

## 2017-11-15 NOTE — Care Management Important Message (Signed)
Copy of signed IM left with patient in room.  

## 2017-11-16 ENCOUNTER — Telehealth: Payer: Self-pay

## 2017-11-16 NOTE — Telephone Encounter (Signed)
Transition Care Management Follow-up Telephone Call   Date of discharge and from where: [facility] on 11/15/17.  Did you receive a copy of your discharge instructions and do you understand them? Yes  How have you been since you were released from the hospital? Doing well, no chest pain or s/s. Wound looks good.   Any patient concerns? None.  Items Reviewed:  Medications obtained and verified? No, pt wanted to confirm meds and f/u apt.  Any new allergies since your discharge? Yes  Dietary orders reviewed? yes, low sodium heart healthy  Do you have support at home? Yes   Functional Questionnaire: (I = Independent and D = Dependent) ADLs: I Bathing/Dressing- I Meal Prep/Eating- I eating, D meal prep Maintaining continence- I Transferring/Ambulation- I Managing Meds- D, daughter is managing the medications.  Follow up appointments reviewed: yes  PCP Hospital f/u appt confirmed? yes Scheduled to see [name] on 11/22/17 @ 11:00 AM.  [facility] f/u appt confirmed? yes Scheduled to see Dr Dahlia Byes on 12/06/17 @ 9:30 AM.  Are transportation arrangements needed? no   If their condition worsens, is the pt aware to call PCP or go to the Emergency Dept.? Yes  Was the patient provided with contact information for the PCP's office or ED? No Was to pt encouraged to call back with questions or concerns? Yes

## 2017-11-17 ENCOUNTER — Telehealth: Payer: Self-pay | Admitting: Physician Assistant

## 2017-11-17 DIAGNOSIS — Z434 Encounter for attention to other artificial openings of digestive tract: Secondary | ICD-10-CM | POA: Diagnosis not present

## 2017-11-17 DIAGNOSIS — Z8551 Personal history of malignant neoplasm of bladder: Secondary | ICD-10-CM | POA: Diagnosis not present

## 2017-11-17 DIAGNOSIS — I2510 Atherosclerotic heart disease of native coronary artery without angina pectoris: Secondary | ICD-10-CM | POA: Diagnosis not present

## 2017-11-17 DIAGNOSIS — I482 Chronic atrial fibrillation: Secondary | ICD-10-CM | POA: Diagnosis not present

## 2017-11-17 DIAGNOSIS — N170 Acute kidney failure with tubular necrosis: Secondary | ICD-10-CM | POA: Diagnosis not present

## 2017-11-17 DIAGNOSIS — Z79818 Long term (current) use of other agents affecting estrogen receptors and estrogen levels: Secondary | ICD-10-CM | POA: Diagnosis not present

## 2017-11-17 DIAGNOSIS — F329 Major depressive disorder, single episode, unspecified: Secondary | ICD-10-CM | POA: Diagnosis not present

## 2017-11-17 DIAGNOSIS — Z951 Presence of aortocoronary bypass graft: Secondary | ICD-10-CM | POA: Diagnosis not present

## 2017-11-17 DIAGNOSIS — Z923 Personal history of irradiation: Secondary | ICD-10-CM | POA: Diagnosis not present

## 2017-11-17 DIAGNOSIS — Z79899 Other long term (current) drug therapy: Secondary | ICD-10-CM | POA: Diagnosis not present

## 2017-11-17 DIAGNOSIS — E871 Hypo-osmolality and hyponatremia: Secondary | ICD-10-CM | POA: Diagnosis not present

## 2017-11-17 DIAGNOSIS — Z8614 Personal history of Methicillin resistant Staphylococcus aureus infection: Secondary | ICD-10-CM | POA: Diagnosis not present

## 2017-11-17 DIAGNOSIS — K8000 Calculus of gallbladder with acute cholecystitis without obstruction: Secondary | ICD-10-CM | POA: Diagnosis not present

## 2017-11-17 DIAGNOSIS — I509 Heart failure, unspecified: Secondary | ICD-10-CM | POA: Diagnosis not present

## 2017-11-17 DIAGNOSIS — E785 Hyperlipidemia, unspecified: Secondary | ICD-10-CM | POA: Diagnosis not present

## 2017-11-17 DIAGNOSIS — Z7901 Long term (current) use of anticoagulants: Secondary | ICD-10-CM | POA: Diagnosis not present

## 2017-11-17 DIAGNOSIS — K449 Diaphragmatic hernia without obstruction or gangrene: Secondary | ICD-10-CM | POA: Diagnosis not present

## 2017-11-17 DIAGNOSIS — I110 Hypertensive heart disease with heart failure: Secondary | ICD-10-CM | POA: Diagnosis not present

## 2017-11-17 DIAGNOSIS — C61 Malignant neoplasm of prostate: Secondary | ICD-10-CM | POA: Diagnosis not present

## 2017-11-17 DIAGNOSIS — I255 Ischemic cardiomyopathy: Secondary | ICD-10-CM | POA: Diagnosis not present

## 2017-11-17 DIAGNOSIS — N400 Enlarged prostate without lower urinary tract symptoms: Secondary | ICD-10-CM | POA: Diagnosis not present

## 2017-11-17 DIAGNOSIS — Z7982 Long term (current) use of aspirin: Secondary | ICD-10-CM | POA: Diagnosis not present

## 2017-11-17 LAB — BODY FLUID CULTURE: Culture: NO GROWTH

## 2017-11-17 NOTE — Telephone Encounter (Signed)
Butch Penny with [facility], needs a verbal Plan of care... 1 week 1, 2 week 4 and then 1 week 4 for monitoring his drain in his gallbladder and teaching care of it.    Please ok today.

## 2017-11-17 NOTE — Telephone Encounter (Signed)
Verbal ok given to Butch Penny with [facility].

## 2017-11-17 NOTE — Telephone Encounter (Signed)
Ok for verbal order  °

## 2017-11-20 ENCOUNTER — Telehealth: Payer: Self-pay

## 2017-11-20 ENCOUNTER — Telehealth: Payer: Self-pay | Admitting: Physician Assistant

## 2017-11-20 DIAGNOSIS — K819 Cholecystitis, unspecified: Secondary | ICD-10-CM

## 2017-11-20 DIAGNOSIS — E871 Hypo-osmolality and hyponatremia: Secondary | ICD-10-CM

## 2017-11-20 NOTE — Telephone Encounter (Signed)
Charles Cook with [facility] is requesting a verbal okay for plan of care orders. Patient has a gallbladder drain. Charles Cook is requesting care orders for 1 week 1,  2 week 2,  1 week 3, and 1 every other week 2. Please advise. CB# [phone]

## 2017-11-20 NOTE — Telephone Encounter (Signed)
Pease Review

## 2017-11-20 NOTE — Telephone Encounter (Signed)
Is this ok?

## 2017-11-20 NOTE — Telephone Encounter (Signed)
Labs ordered for him.

## 2017-11-20 NOTE — Telephone Encounter (Signed)
Charles Cook was advised.  Thanks,  -Marykay Mccleod

## 2017-11-20 NOTE — Telephone Encounter (Signed)
Pt's daughter [name] stated pt is scheduled for hospital f/u 11/22/17 and that the hospital advised he should probably have labs done at the visit. [name] is requesting that pt come today or tomorrow to have the labs done. [name] stated that pt needs to have labs done to check on the infection in his gallbladder & his sodium rechecked. Please advise. Thanks TNP

## 2017-11-21 ENCOUNTER — Telehealth: Payer: Self-pay

## 2017-11-21 DIAGNOSIS — I2510 Atherosclerotic heart disease of native coronary artery without angina pectoris: Secondary | ICD-10-CM | POA: Diagnosis not present

## 2017-11-21 DIAGNOSIS — K8000 Calculus of gallbladder with acute cholecystitis without obstruction: Secondary | ICD-10-CM | POA: Diagnosis not present

## 2017-11-21 DIAGNOSIS — I110 Hypertensive heart disease with heart failure: Secondary | ICD-10-CM | POA: Diagnosis not present

## 2017-11-21 DIAGNOSIS — N170 Acute kidney failure with tubular necrosis: Secondary | ICD-10-CM | POA: Diagnosis not present

## 2017-11-21 DIAGNOSIS — K819 Cholecystitis, unspecified: Secondary | ICD-10-CM | POA: Diagnosis not present

## 2017-11-21 DIAGNOSIS — Z434 Encounter for attention to other artificial openings of digestive tract: Secondary | ICD-10-CM | POA: Diagnosis not present

## 2017-11-21 DIAGNOSIS — E871 Hypo-osmolality and hyponatremia: Secondary | ICD-10-CM | POA: Diagnosis not present

## 2017-11-21 DIAGNOSIS — I255 Ischemic cardiomyopathy: Secondary | ICD-10-CM | POA: Diagnosis not present

## 2017-11-21 NOTE — Telephone Encounter (Signed)
Error

## 2017-11-21 NOTE — Telephone Encounter (Signed)
Sonja with [facility] called to see if verbal orders were approved yet. Verbal okay given to [facility] with [place].

## 2017-11-21 NOTE — Telephone Encounter (Signed)
Yes please

## 2017-11-22 ENCOUNTER — Ambulatory Visit (INDEPENDENT_AMBULATORY_CARE_PROVIDER_SITE_OTHER): Payer: Medicare Other | Admitting: Physician Assistant

## 2017-11-22 ENCOUNTER — Encounter: Payer: Self-pay | Admitting: Physician Assistant

## 2017-11-22 VITALS — BP 112/40 | HR 58 | Temp 97.7°F | Resp 16 | Wt 186.0 lb

## 2017-11-22 DIAGNOSIS — Z9189 Other specified personal risk factors, not elsewhere classified: Secondary | ICD-10-CM

## 2017-11-22 DIAGNOSIS — IMO0001 Reserved for inherently not codable concepts without codable children: Secondary | ICD-10-CM

## 2017-11-22 DIAGNOSIS — K8000 Calculus of gallbladder with acute cholecystitis without obstruction: Secondary | ICD-10-CM

## 2017-11-22 DIAGNOSIS — E871 Hypo-osmolality and hyponatremia: Secondary | ICD-10-CM

## 2017-11-22 DIAGNOSIS — N179 Acute kidney failure, unspecified: Secondary | ICD-10-CM | POA: Diagnosis not present

## 2017-11-22 DIAGNOSIS — I959 Hypotension, unspecified: Secondary | ICD-10-CM | POA: Diagnosis not present

## 2017-11-22 LAB — COMPREHENSIVE METABOLIC PANEL
ALT: 39 IU/L (ref 0–44)
AST: 38 IU/L (ref 0–40)
Albumin/Globulin Ratio: 1.8 (ref 1.2–2.2)
Albumin: 3.5 g/dL (ref 3.5–4.7)
Alkaline Phosphatase: 87 IU/L (ref 39–117)
BUN/Creatinine Ratio: 9 — ABNORMAL LOW (ref 10–24)
BUN: 10 mg/dL (ref 8–27)
Bilirubin Total: 0.3 mg/dL (ref 0.0–1.2)
CO2: 21 mmol/L (ref 20–29)
Calcium: 8.7 mg/dL (ref 8.6–10.2)
Chloride: 100 mmol/L (ref 96–106)
Creatinine, Ser: 1.14 mg/dL (ref 0.76–1.27)
GFR calc Af Amer: 67 mL/min/{1.73_m2} (ref >59)
GFR calc non Af Amer: 58 mL/min/{1.73_m2} — ABNORMAL LOW (ref >59)
Globulin, Total: 1.9 g/dL (ref 1.5–4.5)
Glucose: 110 mg/dL — ABNORMAL HIGH (ref 65–99)
Potassium: 4.9 mmol/L (ref 3.5–5.2)
Sodium: 136 mmol/L (ref 134–144)
Total Protein: 5.4 g/dL — ABNORMAL LOW (ref 6.0–8.5)

## 2017-11-22 LAB — CBC WITH DIFFERENTIAL/PLATELET
Basophils Absolute: 0 10*3/uL (ref 0.0–0.2)
Basos: 0 %
EOS (ABSOLUTE): 0.6 10*3/uL — ABNORMAL HIGH (ref 0.0–0.4)
Eos: 7 %
Hematocrit: 34.8 % — ABNORMAL LOW (ref 37.5–51.0)
Hemoglobin: 11.4 g/dL — ABNORMAL LOW (ref 13.0–17.7)
Immature Grans (Abs): 0.1 10*3/uL (ref 0.0–0.1)
Immature Granulocytes: 1 %
Lymphocytes Absolute: 1.2 10*3/uL (ref 0.7–3.1)
Lymphs: 13 %
MCH: 32.9 pg (ref 26.6–33.0)
MCHC: 32.8 g/dL (ref 31.5–35.7)
MCV: 101 fL — ABNORMAL HIGH (ref 79–97)
Monocytes Absolute: 1.1 10*3/uL — ABNORMAL HIGH (ref 0.1–0.9)
Monocytes: 11 %
Neutrophils Absolute: 6.5 10*3/uL (ref 1.4–7.0)
Neutrophils: 68 %
Platelets: 313 10*3/uL (ref 150–450)
RBC: 3.46 x10E6/uL — ABNORMAL LOW (ref 4.14–5.80)
RDW: 13.5 % (ref 12.3–15.4)
WBC: 9.6 10*3/uL (ref 3.4–10.8)

## 2017-11-22 NOTE — Progress Notes (Signed)
Patient: Charles Cook Male    DOB: [birth year]   84 y.o.   MRN: [id number] Visit Date: 11/22/2017  Today's Provider: Mar Daring, PA-C   Chief Complaint  Patient presents with  . Hospitalization Follow-up   Subjective:    HPI  Follow up Hospitalization Patient is here today with his daughters. Patient was admitted to [facility] on 11/12/17 and discharged on 11/15/17. He was treated for Acute cholecystitis s/p cholecystostomy drain by IR on 11/13/17. Acute Renal failure suspected due to ATN. Treatment for this included percutaneous cholecystostomy tube placement on July 15th. Restart his Eliquis. Telephone follow up was done on 11/16/17 by [facility]. He reports excellent compliance with treatment. He reports this condition is Improved. Overall reports he is feeling well. Has continued fatigue but no more than baseline. ------------------------------------------------------------------------------------    Allergies  Allergen Reactions  . Moxifloxacin Hcl In Nacl Other (See Comments)    Reaction:  Confusion      Current Outpatient Medications:  .  amiodarone (PACERONE) 200 MG tablet, Take 200 mg by mouth daily. , Disp: , Rfl:  .  amoxicillin-clavulanate (AUGMENTIN) 875-125 MG tablet, Take 1 tablet by mouth every 12 (twelve) hours for 7 days., Disp: 14 tablet, Rfl: 0 .  apixaban (ELIQUIS) 2.5 MG TABS tablet, Take 1 tablet (2.5 mg total) by mouth 2 (two) times daily., Disp: 60 tablet, Rfl: 1 .  aspirin EC 81 MG tablet, Take 81 mg by mouth at bedtime., Disp: , Rfl:  .  Calcium Carbonate-Vitamin D (CALCIUM-D) 600-400 MG-UNIT TABS, Take 1 tablet by mouth 2 (two) times daily. , Disp: , Rfl:  .  Cyanocobalamin (VITAMIN B-12) 5000 MCG SUBL, Place 5,000 mcg under the tongue daily., Disp: , Rfl:  .  denosumab (PROLIA) 60 MG/ML SOSY injection, Inject 60 mg into the skin every 6 (six) months., Disp: , Rfl:  .  digoxin (DIGOX) 0.25 MG tablet, Take 0.5 tablets (0.125 mg  total) by mouth daily., Disp: 90 tablet, Rfl: 1 .  diphenhydrAMINE (BENADRYL) 25 MG tablet, Take 25 mg by mouth at bedtime as needed for allergies. Reported on 10/27/2015, Disp: , Rfl:  .  fluticasone (FLONASE) 50 MCG/ACT nasal spray, USE 2 SPRAYS IN EACH NOSTRIL DAILY, Disp: 48 g, Rfl: 1 .  Leuprolide Acetate, 6 Month, (LUPRON DEPOT, 74-MONTH, IM), Inject into the muscle every 6 (six) months., Disp: , Rfl:  .  metoprolol succinate (TOPROL-XL) 25 MG 24 hr tablet, Take 1 tablet by mouth every evening. , Disp: , Rfl:  .  Multiple Vitamin (MULTIVITAMIN WITH MINERALS) TABS tablet, Take 1 tablet by mouth daily., Disp: , Rfl:  .  niacin (NIASPAN) 500 MG CR tablet, TAKE ONE TABLET BY MOUTH ONCE DAILY, Disp: 90 tablet, Rfl: 3 .  sertraline (ZOLOFT) 50 MG tablet, Take 1 tablet by mouth daily., Disp: , Rfl:  .  traZODone (DESYREL) 100 MG tablet, TAKE ONE TABLET BY MOUTH AT BEDTIME, Disp: 90 tablet, Rfl: 1  Review of Systems  Constitutional: Positive for fatigue.  Respiratory: Negative.   Cardiovascular: Negative.   Gastrointestinal: Negative.   Neurological: Positive for weakness. Negative for numbness.    Social History   Tobacco Use  . Smoking status: Never Smoker  . Smokeless tobacco: Never Used  Substance Use Topics  . Alcohol use: Yes    Alcohol/week: 0.0 oz    Comment: one glass in 6 months    Objective:   BP (!) 112/40 (BP Location: Left Arm, Patient Position:  Sitting, Cuff Size: Normal) Comment: Recheck by Mickel Baas cma 114/42  Pulse (!) 58   Temp 97.7 F (36.5 C) (Oral)   Resp 16   Wt 186 lb (84.4 kg)   SpO2 97%   BMI 25.23 kg/m  Vitals:   11/22/17 1112  BP: (!) 112/40  Pulse: (!) 58  Resp: 16  Temp: 97.7 F (36.5 C)  TempSrc: Oral  SpO2: 97%  Weight: 186 lb (84.4 kg)     Physical Exam  Constitutional: He appears well-developed and well-nourished.  HENT:  Head: Normocephalic and atraumatic.  Eyes: Pupils are equal, round, and reactive to light. EOM are normal.  Neck:  Normal range of motion. Neck supple.  Pulmonary/Chest: Effort normal. No respiratory distress.  Abdominal: Soft. Bowel sounds are normal. He exhibits no distension and no mass. There is tenderness (mild RUQ tenderness around drain). There is no rebound and no guarding. No hernia.         Assessment & Plan:      1. Transition of care performed with sharing of clinical summary Hospitalization notes, labs and imaging results reviewed today from hospitalization from 11/12/17-11/15/17.  2. Calculus of gallbladder with acute cholecystitis without obstruction 80cc of bilious fluid drained off today in office. Tubing site was flushed. Drain insertion site was inspected and redressed with dry dressing. Currently doing well. Has f/u with gen surg, Dr. Dahlia Byes, on 12/06/17. HH RN comes twice weekly. Call if symptoms worsen or change.   3. Acute kidney injury ([place]) Improved to baseline.  4. Hyponatremia Improved, now normal range.  5. Hypotension, unspecified hypotension type DBP low today and HR still borderline bradycardic. Daughter is going to call to see if appt with Dr. Ubaldo Glassing can be changed to try to see about cardiac clearance for lap cholecystectomy that will be planned. May need medication adjustment, most likely a decrease in metoprolol. Will await cardiology input at this time since patient is doing better, does not show orthostasis symptoms, and atrial fib has been stable for such a long time on current regimen.         Mar Daring, PA-C  [facility]

## 2017-11-23 DIAGNOSIS — Z434 Encounter for attention to other artificial openings of digestive tract: Secondary | ICD-10-CM | POA: Diagnosis not present

## 2017-11-23 DIAGNOSIS — N170 Acute kidney failure with tubular necrosis: Secondary | ICD-10-CM | POA: Diagnosis not present

## 2017-11-23 DIAGNOSIS — I110 Hypertensive heart disease with heart failure: Secondary | ICD-10-CM | POA: Diagnosis not present

## 2017-11-23 DIAGNOSIS — I255 Ischemic cardiomyopathy: Secondary | ICD-10-CM | POA: Diagnosis not present

## 2017-11-23 DIAGNOSIS — I2510 Atherosclerotic heart disease of native coronary artery without angina pectoris: Secondary | ICD-10-CM | POA: Diagnosis not present

## 2017-11-23 DIAGNOSIS — K8000 Calculus of gallbladder with acute cholecystitis without obstruction: Secondary | ICD-10-CM | POA: Diagnosis not present

## 2017-11-27 ENCOUNTER — Telehealth: Payer: Self-pay | Admitting: General Practice

## 2017-11-27 DIAGNOSIS — E782 Mixed hyperlipidemia: Secondary | ICD-10-CM | POA: Diagnosis not present

## 2017-11-27 DIAGNOSIS — I10 Essential (primary) hypertension: Secondary | ICD-10-CM | POA: Diagnosis not present

## 2017-11-27 DIAGNOSIS — R0609 Other forms of dyspnea: Secondary | ICD-10-CM | POA: Diagnosis not present

## 2017-11-27 DIAGNOSIS — I420 Dilated cardiomyopathy: Secondary | ICD-10-CM | POA: Diagnosis not present

## 2017-11-27 DIAGNOSIS — R0602 Shortness of breath: Secondary | ICD-10-CM | POA: Diagnosis not present

## 2017-11-27 DIAGNOSIS — I481 Persistent atrial fibrillation: Secondary | ICD-10-CM | POA: Diagnosis not present

## 2017-11-27 DIAGNOSIS — I25810 Atherosclerosis of coronary artery bypass graft(s) without angina pectoris: Secondary | ICD-10-CM | POA: Diagnosis not present

## 2017-11-27 NOTE — Telephone Encounter (Signed)
Patients daughter is calling asking for patient to get a clearance sent to Dr. Ubaldo Glassing office. She can be reached  [phone]. Please call patients daughter and advise.

## 2017-11-27 NOTE — Telephone Encounter (Signed)
Spoke with patient's daughter and we discussed the cardiac clearance and I let her know that once she speaks with Dr.Pabon at her dads follow up appointment we will send the clearance. She said her dad has an appointment today with Dr.Fath.

## 2017-11-28 DIAGNOSIS — K8000 Calculus of gallbladder with acute cholecystitis without obstruction: Secondary | ICD-10-CM | POA: Diagnosis not present

## 2017-11-28 DIAGNOSIS — Z434 Encounter for attention to other artificial openings of digestive tract: Secondary | ICD-10-CM | POA: Diagnosis not present

## 2017-11-28 DIAGNOSIS — I255 Ischemic cardiomyopathy: Secondary | ICD-10-CM | POA: Diagnosis not present

## 2017-11-28 DIAGNOSIS — N170 Acute kidney failure with tubular necrosis: Secondary | ICD-10-CM | POA: Diagnosis not present

## 2017-11-28 DIAGNOSIS — I2510 Atherosclerotic heart disease of native coronary artery without angina pectoris: Secondary | ICD-10-CM | POA: Diagnosis not present

## 2017-11-28 DIAGNOSIS — I110 Hypertensive heart disease with heart failure: Secondary | ICD-10-CM | POA: Diagnosis not present

## 2017-11-30 ENCOUNTER — Telehealth: Payer: Self-pay | Admitting: Physician Assistant

## 2017-11-30 NOTE — Telephone Encounter (Signed)
Pt's daughter [name] called saying she does not feel that her dad is doing very well.  He has a bag from his gallbladder that is draining.  He is not eating and is weak/  He was just in about a week ago.    She is going over there the check on him today.  She wants to talk to [facility] about what to do for him.  The call back is [phone]  Thanks Con Memos

## 2017-12-01 DIAGNOSIS — I110 Hypertensive heart disease with heart failure: Secondary | ICD-10-CM | POA: Diagnosis not present

## 2017-12-01 DIAGNOSIS — I255 Ischemic cardiomyopathy: Secondary | ICD-10-CM | POA: Diagnosis not present

## 2017-12-01 DIAGNOSIS — Z434 Encounter for attention to other artificial openings of digestive tract: Secondary | ICD-10-CM | POA: Diagnosis not present

## 2017-12-01 DIAGNOSIS — K8000 Calculus of gallbladder with acute cholecystitis without obstruction: Secondary | ICD-10-CM | POA: Diagnosis not present

## 2017-12-01 DIAGNOSIS — N170 Acute kidney failure with tubular necrosis: Secondary | ICD-10-CM | POA: Diagnosis not present

## 2017-12-01 DIAGNOSIS — I2510 Atherosclerotic heart disease of native coronary artery without angina pectoris: Secondary | ICD-10-CM | POA: Diagnosis not present

## 2017-12-01 NOTE — Telephone Encounter (Signed)
Called and spoke with Charles Cook and she reports her dad is doing some better. When she went over yesterday he was eating and up and dressed. She reports today her husband is going over and going to take him to lunch. They also saw Dr. Ubaldo Glassing and have the echo and stress test scheduled for 12/18/17.

## 2017-12-04 DIAGNOSIS — I255 Ischemic cardiomyopathy: Secondary | ICD-10-CM | POA: Diagnosis not present

## 2017-12-04 DIAGNOSIS — K8000 Calculus of gallbladder with acute cholecystitis without obstruction: Secondary | ICD-10-CM | POA: Diagnosis not present

## 2017-12-04 DIAGNOSIS — Z434 Encounter for attention to other artificial openings of digestive tract: Secondary | ICD-10-CM | POA: Diagnosis not present

## 2017-12-04 DIAGNOSIS — N170 Acute kidney failure with tubular necrosis: Secondary | ICD-10-CM | POA: Diagnosis not present

## 2017-12-04 DIAGNOSIS — I2510 Atherosclerotic heart disease of native coronary artery without angina pectoris: Secondary | ICD-10-CM | POA: Diagnosis not present

## 2017-12-04 DIAGNOSIS — I110 Hypertensive heart disease with heart failure: Secondary | ICD-10-CM | POA: Diagnosis not present

## 2017-12-06 ENCOUNTER — Ambulatory Visit (INDEPENDENT_AMBULATORY_CARE_PROVIDER_SITE_OTHER): Payer: Medicare Other | Admitting: Surgery

## 2017-12-06 ENCOUNTER — Encounter: Payer: Self-pay | Admitting: Surgery

## 2017-12-06 VITALS — BP 122/65 | HR 60 | Temp 97.6°F | Ht 72.0 in | Wt 177.8 lb

## 2017-12-06 DIAGNOSIS — K810 Acute cholecystitis: Secondary | ICD-10-CM | POA: Diagnosis not present

## 2017-12-06 NOTE — Patient Instructions (Signed)
Please continue to record the drain output.  Please continue to drink Ensure.  Please see your follow up appointment listed below.
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
## 2017-12-07 DIAGNOSIS — K8000 Calculus of gallbladder with acute cholecystitis without obstruction: Secondary | ICD-10-CM | POA: Diagnosis not present

## 2017-12-07 DIAGNOSIS — I2510 Atherosclerotic heart disease of native coronary artery without angina pectoris: Secondary | ICD-10-CM | POA: Diagnosis not present

## 2017-12-07 DIAGNOSIS — N170 Acute kidney failure with tubular necrosis: Secondary | ICD-10-CM | POA: Diagnosis not present

## 2017-12-07 DIAGNOSIS — Z434 Encounter for attention to other artificial openings of digestive tract: Secondary | ICD-10-CM | POA: Diagnosis not present

## 2017-12-07 DIAGNOSIS — I110 Hypertensive heart disease with heart failure: Secondary | ICD-10-CM | POA: Diagnosis not present

## 2017-12-07 DIAGNOSIS — I255 Ischemic cardiomyopathy: Secondary | ICD-10-CM | POA: Diagnosis not present

## 2017-12-12 DIAGNOSIS — Z434 Encounter for attention to other artificial openings of digestive tract: Secondary | ICD-10-CM | POA: Diagnosis not present

## 2017-12-12 DIAGNOSIS — N170 Acute kidney failure with tubular necrosis: Secondary | ICD-10-CM | POA: Diagnosis not present

## 2017-12-12 DIAGNOSIS — I255 Ischemic cardiomyopathy: Secondary | ICD-10-CM | POA: Diagnosis not present

## 2017-12-12 DIAGNOSIS — I110 Hypertensive heart disease with heart failure: Secondary | ICD-10-CM | POA: Diagnosis not present

## 2017-12-12 DIAGNOSIS — K8000 Calculus of gallbladder with acute cholecystitis without obstruction: Secondary | ICD-10-CM | POA: Diagnosis not present

## 2017-12-12 DIAGNOSIS — I2510 Atherosclerotic heart disease of native coronary artery without angina pectoris: Secondary | ICD-10-CM | POA: Diagnosis not present

## 2017-12-14 ENCOUNTER — Other Ambulatory Visit: Payer: Self-pay

## 2017-12-14 ENCOUNTER — Other Ambulatory Visit
Admission: RE | Admit: 2017-12-14 | Discharge: 2017-12-14 | Disposition: A | Discharge status: Home / Self Care | Payer: Medicare Other | Source: Ambulatory Visit | Attending: Surgery | Admitting: Surgery

## 2017-12-14 ENCOUNTER — Ambulatory Visit (INDEPENDENT_AMBULATORY_CARE_PROVIDER_SITE_OTHER): Payer: Medicare Other | Admitting: Surgery

## 2017-12-14 ENCOUNTER — Other Ambulatory Visit: Payer: Self-pay | Admitting: Surgery

## 2017-12-14 ENCOUNTER — Encounter: Payer: Self-pay | Admitting: Surgery

## 2017-12-14 ENCOUNTER — Telehealth: Payer: Self-pay | Admitting: Surgery

## 2017-12-14 ENCOUNTER — Ambulatory Visit
Admission: RE | Admit: 2017-12-14 | Discharge: 2017-12-14 | Disposition: A | Discharge status: Home / Self Care | Payer: Medicare Other | Source: Ambulatory Visit | Attending: Surgery | Admitting: Surgery

## 2017-12-14 VITALS — BP 140/70 | HR 78 | Temp 97.0°F | Wt 180.0 lb

## 2017-12-14 DIAGNOSIS — K810 Acute cholecystitis: Secondary | ICD-10-CM | POA: Diagnosis not present

## 2017-12-14 DIAGNOSIS — R1084 Generalized abdominal pain: Secondary | ICD-10-CM

## 2017-12-14 DIAGNOSIS — K862 Cyst of pancreas: Secondary | ICD-10-CM | POA: Diagnosis not present

## 2017-12-14 DIAGNOSIS — C61 Malignant neoplasm of prostate: Secondary | ICD-10-CM

## 2017-12-14 DIAGNOSIS — C772 Secondary and unspecified malignant neoplasm of intra-abdominal lymph nodes: Secondary | ICD-10-CM

## 2017-12-14 DIAGNOSIS — M85859 Other specified disorders of bone density and structure, unspecified thigh: Secondary | ICD-10-CM

## 2017-12-14 DIAGNOSIS — I700 Atherosclerosis of aorta: Secondary | ICD-10-CM | POA: Insufficient documentation

## 2017-12-14 DIAGNOSIS — K8020 Calculus of gallbladder without cholecystitis without obstruction: Secondary | ICD-10-CM | POA: Insufficient documentation

## 2017-12-14 LAB — COMPREHENSIVE METABOLIC PANEL
ALT: 60 U/L — ABNORMAL HIGH (ref 0–44)
AST: 66 U/L — ABNORMAL HIGH (ref 15–41)
Albumin: 3.8 g/dL (ref 3.5–5.0)
Alkaline Phosphatase: 73 U/L (ref 38–126)
Anion gap: 8 (ref 5–15)
BUN: 13 mg/dL (ref 8–23)
CO2: 26 mmol/L (ref 22–32)
Calcium: 8.9 mg/dL (ref 8.9–10.3)
Chloride: 94 mmol/L — ABNORMAL LOW (ref 98–111)
Creatinine, Ser: 1.08 mg/dL (ref 0.61–1.24)
GFR calc Af Amer: >60 mL/min (ref >60)
GFR calc non Af Amer: >60 mL/min (ref >60)
Glucose, Bld: 117 mg/dL — ABNORMAL HIGH (ref 70–99)
Potassium: 4.6 mmol/L (ref 3.5–5.1)
Sodium: 128 mmol/L — ABNORMAL LOW (ref 135–145)
Total Bilirubin: 0.6 mg/dL (ref 0.3–1.2)
Total Protein: 6.6 g/dL (ref 6.5–8.1)

## 2017-12-14 LAB — CBC
HCT: 35.7 % — ABNORMAL LOW (ref 40.0–52.0)
Hemoglobin: 12.4 g/dL — ABNORMAL LOW (ref 13.0–18.0)
MCH: 33.8 pg (ref 26.0–34.0)
MCHC: 34.8 g/dL (ref 32.0–36.0)
MCV: 97 fL (ref 80.0–100.0)
Platelets: 210 10*3/uL (ref 150–440)
RBC: 3.68 MIL/uL — ABNORMAL LOW (ref 4.40–5.90)
RDW: 13.3 % (ref 11.5–14.5)
WBC: 7.4 10*3/uL (ref 3.8–10.6)

## 2017-12-14 MED ORDER — IOPAMIDOL (ISOVUE-300) INJECTION 61%
100.0000 mL | Freq: Once | INTRAVENOUS | Status: AC | PRN
  Administered 2017-12-14: 100 mL via INTRAVENOUS

## 2017-12-14 NOTE — Telephone Encounter (Signed)
Patients daughter left message. Patient feels that his infection is back and there is pus in his drain. Please advise
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
## 2017-12-14 NOTE — Telephone Encounter (Signed)
Called patient's daughter Charles Cook as told by her sister Tilda Burrow. I asked her how her father was doing. She stated that her dad was seen by their nurse and was told that he was having some redness on the drain area, pain, pus from the drain with a foul smell to it. Charles Cook denied her dad having fever, chills, nausea, vomiting, diarrhea or constipation. I told her that I had spoken to Dr. Dahlia Byes about it before calling her back and he recommended for him to have a STAT CT Scan abdomen and pelvis, CMP and CBC. I told Charles Cook that she needed to take her father to the [facility] right away and then to the [facility] for him to have blood work done. Charles Cook understood and stated that she will go and pick up her father and take him. Charles Cook was told to bring her father to our office after he had everything done. She understood and had no further questions.

## 2017-12-14 NOTE — Patient Instructions (Addendum)
Please stop taking Eliquis as of today. We will let you know when to restart.  Tomorrow (12/15/2017) you will have your MRCP done at the [facility] at 8:30 AM. Please do not eat or drink anything after midnight today.  Your appointment with Dr. Ubaldo Glassing will be on 12/20/2017 at 8:30 AM at [facility].  On Monday you are scheduled to have your ECHO and Stress Test.  We will see you back on 12/27/2017 at 1100 AM at our new location.  Please give [place] a call in case you have any questions or concerns.

## 2017-12-15 ENCOUNTER — Ambulatory Visit
Admission: RE | Admit: 2017-12-15 | Discharge: 2017-12-15 | Disposition: A | Discharge status: Home / Self Care | Payer: Medicare Other | Source: Ambulatory Visit | Attending: Surgery | Admitting: Surgery

## 2017-12-15 DIAGNOSIS — K8020 Calculus of gallbladder without cholecystitis without obstruction: Secondary | ICD-10-CM | POA: Diagnosis not present

## 2017-12-15 DIAGNOSIS — I700 Atherosclerosis of aorta: Secondary | ICD-10-CM | POA: Diagnosis not present

## 2017-12-15 DIAGNOSIS — R1084 Generalized abdominal pain: Secondary | ICD-10-CM

## 2017-12-15 DIAGNOSIS — R932 Abnormal findings on diagnostic imaging of liver and biliary tract: Secondary | ICD-10-CM | POA: Diagnosis not present

## 2017-12-15 MED ORDER — GADOBENATE DIMEGLUMINE 529 MG/ML IV SOLN
15.0000 mL | Freq: Once | INTRAVENOUS | Status: AC | PRN
  Administered 2017-12-15: 15 mL via INTRAVENOUS

## 2017-12-15 NOTE — Addendum Note (Signed)
Addended by: Celene Kras on: 12/15/2017 11:02 AM   Modules accepted: Orders

## 2017-12-18 ENCOUNTER — Telehealth: Payer: Self-pay | Admitting: *Deleted

## 2017-12-18 DIAGNOSIS — R0602 Shortness of breath: Secondary | ICD-10-CM | POA: Diagnosis not present

## 2017-12-18 NOTE — Telephone Encounter (Signed)
Notified patient as instructed, patient pleased. Discussed follow-up appointments, patient agrees  

## 2017-12-18 NOTE — Telephone Encounter (Signed)
-----   Message from Jules Husbands, MD sent at 12/15/2017  1:24 PM EDT ----- Please tell pt to resume anticoagulation and keep appt with me. No need for further testing, no need for ERCP Thanks  ----- Message ----- From: Interface, Rad Results In Sent: 12/15/2017  11:00 AM EDT To: Jules Husbands, MD

## 2017-12-20 DIAGNOSIS — E782 Mixed hyperlipidemia: Secondary | ICD-10-CM | POA: Diagnosis not present

## 2017-12-20 DIAGNOSIS — I481 Persistent atrial fibrillation: Secondary | ICD-10-CM | POA: Diagnosis not present

## 2017-12-20 DIAGNOSIS — I25810 Atherosclerosis of coronary artery bypass graft(s) without angina pectoris: Secondary | ICD-10-CM | POA: Diagnosis not present

## 2017-12-20 DIAGNOSIS — I420 Dilated cardiomyopathy: Secondary | ICD-10-CM | POA: Diagnosis not present

## 2017-12-20 DIAGNOSIS — I10 Essential (primary) hypertension: Secondary | ICD-10-CM | POA: Diagnosis not present

## 2017-12-21 DIAGNOSIS — K8000 Calculus of gallbladder with acute cholecystitis without obstruction: Secondary | ICD-10-CM | POA: Diagnosis not present

## 2017-12-21 DIAGNOSIS — I255 Ischemic cardiomyopathy: Secondary | ICD-10-CM | POA: Diagnosis not present

## 2017-12-21 DIAGNOSIS — N170 Acute kidney failure with tubular necrosis: Secondary | ICD-10-CM | POA: Diagnosis not present

## 2017-12-21 DIAGNOSIS — Z434 Encounter for attention to other artificial openings of digestive tract: Secondary | ICD-10-CM | POA: Diagnosis not present

## 2017-12-21 DIAGNOSIS — I2510 Atherosclerotic heart disease of native coronary artery without angina pectoris: Secondary | ICD-10-CM | POA: Diagnosis not present

## 2017-12-21 DIAGNOSIS — I110 Hypertensive heart disease with heart failure: Secondary | ICD-10-CM | POA: Diagnosis not present

## 2017-12-25 DIAGNOSIS — N170 Acute kidney failure with tubular necrosis: Secondary | ICD-10-CM | POA: Diagnosis not present

## 2017-12-25 DIAGNOSIS — I255 Ischemic cardiomyopathy: Secondary | ICD-10-CM | POA: Diagnosis not present

## 2017-12-25 DIAGNOSIS — I110 Hypertensive heart disease with heart failure: Secondary | ICD-10-CM | POA: Diagnosis not present

## 2017-12-25 DIAGNOSIS — K8000 Calculus of gallbladder with acute cholecystitis without obstruction: Secondary | ICD-10-CM | POA: Diagnosis not present

## 2017-12-25 DIAGNOSIS — Z434 Encounter for attention to other artificial openings of digestive tract: Secondary | ICD-10-CM | POA: Diagnosis not present

## 2017-12-25 DIAGNOSIS — I2510 Atherosclerotic heart disease of native coronary artery without angina pectoris: Secondary | ICD-10-CM | POA: Diagnosis not present

## 2017-12-27 ENCOUNTER — Encounter: Payer: Self-pay | Admitting: Surgery

## 2017-12-27 ENCOUNTER — Ambulatory Visit (INDEPENDENT_AMBULATORY_CARE_PROVIDER_SITE_OTHER): Payer: Medicare Other | Admitting: Surgery

## 2017-12-27 VITALS — BP 147/65 | HR 57 | Temp 98.3°F | Wt 175.0 lb

## 2017-12-27 DIAGNOSIS — K810 Acute cholecystitis: Secondary | ICD-10-CM | POA: Diagnosis not present
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
## 2017-12-27 NOTE — Patient Instructions (Addendum)
You have requested to have your gallbladder removed. This will be done at [facility] with Dr. Dahlia Byes on 01/25/18. You will pre admit with Anesthesia and pre admit testing at the hospital. We will call you with this appointment date and time.   You will most likely be out of work 1-2 weeks for this surgery. You will return after your post-op appointment with a lifting restriction for approximately 4 more weeks.  You will be able to eat anything you would like to following surgery. But, start by eating a bland diet and advance this as tolerated. The Gallbladder diet is below, please go as closely by this diet as possible prior to surgery to avoid any further attacks.  Please see the (blue)pre-care form that you have been given today. If you have any questions, please call our office.  Laparoscopic Cholecystectomy Laparoscopic cholecystectomy is surgery to remove the gallbladder. The gallbladder is located in the upper right part of the abdomen, behind the liver. It is a storage sac for bile, which is produced in the liver. Bile aids in the digestion and absorption of fats. Cholecystectomy is often done for inflammation of the gallbladder (cholecystitis). This condition is usually caused by a buildup of gallstones (cholelithiasis) in the gallbladder. Gallstones can block the flow of bile, and that can result in inflammation and pain. In severe cases, emergency surgery may be required. If emergency surgery is not required, you will have time to prepare for the procedure. Laparoscopic surgery is an alternative to open surgery. Laparoscopic surgery has a shorter recovery time. Your common bile duct may also need to be examined during the procedure. If stones are found in the common bile duct, they may be removed. LET [facility] CARE PROVIDER KNOW ABOUT:  Any allergies you have.  All medicines you are taking, including vitamins, herbs, eye drops, creams, and over-the-counter medicines.  Previous  problems you or members of your family have had with the use of anesthetics.  Any blood disorders you have.  Previous surgeries you have had.    Any medical conditions you have. RISKS AND COMPLICATIONS Generally, this is a safe procedure. However, problems may occur, including:  Infection.  Bleeding.  Allergic reactions to medicines.  Damage to other structures or organs.  A stone remaining in the common bile duct.  A bile leak from the cyst duct that is clipped when your gallbladder is removed.  The need to convert to open surgery, which requires a larger incision in the abdomen. This may be necessary if your surgeon thinks that it is not safe to continue with a laparoscopic procedure. BEFORE THE PROCEDURE  Ask your health care provider about:  Changing or stopping your regular medicines. This is especially important if you are taking diabetes medicines or blood thinners.  Taking medicines such as aspirin and ibuprofen. These medicines can thin your blood. Do not take these medicines before your procedure if your health care provider instructs you not to.  Follow instructions from your health care provider about eating or drinking restrictions.  Let your health care provider know if you develop a cold or an infection before surgery.  Plan to have someone take you home after the procedure.  Ask your health care provider how your surgical site will be marked or identified.  You may be given antibiotic medicine to help prevent infection. PROCEDURE  To reduce your risk of infection:  Your health care team will wash or sanitize their hands.  Your skin will be  washed with soap.  An IV tube may be inserted into one of your veins.  You will be given a medicine to make you fall asleep (general anesthetic).  A breathing tube will be placed in your mouth.  The surgeon will make several small cuts (incisions) in your abdomen.  A thin, lighted tube (laparoscope) that has  a tiny camera on the end will be inserted through one of the small incisions. The camera on the laparoscope will send a picture to a TV screen (monitor) in the operating room. This will give the surgeon a good view inside your abdomen.  A gas will be pumped into your abdomen. This will expand your abdomen to give the surgeon more room to perform the surgery.  Other tools that are needed for the procedure will be inserted through the other incisions. The gallbladder will be removed through one of the incisions.  After your gallbladder has been removed, the incisions will be closed with stitches (sutures), staples, or skin glue.  Your incisions may be covered with a bandage (dressing). The procedure may vary among health care providers and hospitals. AFTER THE PROCEDURE  Your blood pressure, heart rate, breathing rate, and blood oxygen level will be monitored often until the medicines you were given have worn off.  You will be given medicines as needed to control your pain.   This information is not intended to replace advice given to you by your health care provider. Make sure you discuss any questions you have with your health care provider.   Document Released: 04/18/2005 Document Revised: 01/07/2015 Document Reviewed: 11/28/2012 Elsevier Interactive Patient Education 2016 [place] Diet for Gallbladder Conditions A low-fat diet can be helpful if you have pancreatitis or a gallbladder condition. With these conditions, your pancreas and gallbladder have trouble digesting fats. A healthy eating plan with less fat will help rest your pancreas and gallbladder and reduce your symptoms. WHAT DO I NEED TO KNOW ABOUT THIS DIET?  Eat a low-fat diet.  Reduce your fat intake to less than 20-30% of your total daily calories. This is less than 50-60 g of fat per day.  Remember that you need some fat in your diet. Ask your dietician what your daily goal should be.  Choose nonfat and  low-fat healthy foods. Look for the words "nonfat," "low fat," or "fat free."  As a guide, look on the label and choose foods with less than 3 g of fat per serving. Eat only one serving.  Avoid alcohol.  Do not smoke. If you need help quitting, talk with your health care provider.  Eat small frequent meals instead of three large heavy meals. WHAT FOODS CAN I EAT? Grains Include healthy grains and starches such as potatoes, wheat bread, fiber-rich cereal, and brown rice. Choose whole grain options whenever possible. In adults, whole grains should account for 45-65% of your daily calories.  Fruits and Vegetables Eat plenty of fruits and vegetables. Fresh fruits and vegetables add fiber to your diet. Meats and Other Protein Sources Eat lean meat such as chicken and pork. Trim any fat off of meat before cooking it. Eggs, fish, and beans are other sources of protein. In adults, these foods should account for 10-35% of your daily calories. Dairy Choose low-fat milk and dairy options. Dairy includes fat and protein, as well as calcium.  Fats and Oils Limit high-fat foods such as fried foods, sweets, baked goods, sugary drinks.  Other Creamy sauces and condiments, such  as mayonnaise, can add extra fat. Think about whether or not you need to use them, or use smaller amounts or low fat options. WHAT FOODS ARE NOT RECOMMENDED?  High fat foods, such as:  [employer].  Ice cream.  [place] toast.  Sweet rolls.  Pizza.  Cheese bread.  Foods covered with batter, butter, creamy sauces, or cheese.  Fried foods.  Sugary drinks and desserts.  Foods that cause gas or bloating   This information is not intended to replace advice given to you by your health care provider. Make sure you discuss any questions you have with your health care provider.   Document Released: 04/23/2013 Document Reviewed: 04/23/2013 Elsevier Interactive Patient Education [employer].

## 2017-12-28 ENCOUNTER — Telehealth: Payer: Self-pay | Admitting: *Deleted

## 2017-12-28 NOTE — Telephone Encounter (Signed)
Message left for patient's daughter, [name], to call the office back.   Gallbladder surgery is scheduled for 01-25-18 at [facility] with Dr. Dahlia Byes.   The patient will need to Pre-admit on 01-18-18 at 10 am. Patient will need to stop Eliquis 3 days prior.

## 2017-12-29 NOTE — Telephone Encounter (Signed)
Patient's daughter, [name], notified of pre admit date and time.

## 2018-01-02 ENCOUNTER — Telehealth: Payer: Self-pay | Admitting: Physician Assistant

## 2018-01-02 DIAGNOSIS — I110 Hypertensive heart disease with heart failure: Secondary | ICD-10-CM | POA: Diagnosis not present

## 2018-01-02 DIAGNOSIS — I2510 Atherosclerotic heart disease of native coronary artery without angina pectoris: Secondary | ICD-10-CM | POA: Diagnosis not present

## 2018-01-02 DIAGNOSIS — N170 Acute kidney failure with tubular necrosis: Secondary | ICD-10-CM | POA: Diagnosis not present

## 2018-01-02 DIAGNOSIS — I255 Ischemic cardiomyopathy: Secondary | ICD-10-CM | POA: Diagnosis not present

## 2018-01-02 DIAGNOSIS — Z434 Encounter for attention to other artificial openings of digestive tract: Secondary | ICD-10-CM | POA: Diagnosis not present

## 2018-01-02 DIAGNOSIS — K8000 Calculus of gallbladder with acute cholecystitis without obstruction: Secondary | ICD-10-CM | POA: Diagnosis not present

## 2018-01-02 NOTE — Telephone Encounter (Signed)
Noted. He recently saw his cardiologist, Dr. Ubaldo Glassing and no changes were recommended.

## 2018-01-02 NOTE — Telephone Encounter (Signed)
Charles Cook with [facility] called saying she was at a routine visit and pt's heart rate was 48 to 50 this am.  Otherwise he seems ok.  Just FYI  Thanks [employer]

## 2018-01-11 DIAGNOSIS — I255 Ischemic cardiomyopathy: Secondary | ICD-10-CM | POA: Diagnosis not present

## 2018-01-11 DIAGNOSIS — I2510 Atherosclerotic heart disease of native coronary artery without angina pectoris: Secondary | ICD-10-CM | POA: Diagnosis not present

## 2018-01-11 DIAGNOSIS — Z434 Encounter for attention to other artificial openings of digestive tract: Secondary | ICD-10-CM | POA: Diagnosis not present

## 2018-01-11 DIAGNOSIS — N170 Acute kidney failure with tubular necrosis: Secondary | ICD-10-CM | POA: Diagnosis not present

## 2018-01-11 DIAGNOSIS — I110 Hypertensive heart disease with heart failure: Secondary | ICD-10-CM | POA: Diagnosis not present

## 2018-01-11 DIAGNOSIS — K8000 Calculus of gallbladder with acute cholecystitis without obstruction: Secondary | ICD-10-CM | POA: Diagnosis not present

## 2018-01-18 ENCOUNTER — Encounter
Admission: RE | Admit: 2018-01-18 | Discharge: 2018-01-18 | Disposition: A | Discharge status: Home / Self Care | Payer: Medicare Other | Source: Ambulatory Visit | Attending: Surgery | Admitting: Surgery

## 2018-01-18 ENCOUNTER — Other Ambulatory Visit: Payer: Self-pay

## 2018-01-18 DIAGNOSIS — I2510 Atherosclerotic heart disease of native coronary artery without angina pectoris: Secondary | ICD-10-CM | POA: Insufficient documentation

## 2018-01-18 DIAGNOSIS — E785 Hyperlipidemia, unspecified: Secondary | ICD-10-CM | POA: Insufficient documentation

## 2018-01-18 DIAGNOSIS — I10 Essential (primary) hypertension: Secondary | ICD-10-CM | POA: Insufficient documentation

## 2018-01-18 DIAGNOSIS — I4891 Unspecified atrial fibrillation: Secondary | ICD-10-CM | POA: Diagnosis not present

## 2018-01-18 DIAGNOSIS — Z01812 Encounter for preprocedural laboratory examination: Secondary | ICD-10-CM | POA: Diagnosis not present

## 2018-01-18 HISTORY — DX: Acute myocardial infarction, unspecified: I21.9

## 2018-01-18 LAB — CBC WITH DIFFERENTIAL/PLATELET
Basophils Absolute: 0.1 10*3/uL (ref 0–0.1)
Basophils Relative: 1 %
Eosinophils Absolute: 0.7 10*3/uL (ref 0–0.7)
Eosinophils Relative: 8 %
HCT: 37.1 % — ABNORMAL LOW (ref 40.0–52.0)
Hemoglobin: 12.7 g/dL — ABNORMAL LOW (ref 13.0–18.0)
Lymphocytes Relative: 12 %
Lymphs Abs: 1 10*3/uL (ref 1.0–3.6)
MCH: 33.9 pg (ref 26.0–34.0)
MCHC: 34.2 g/dL (ref 32.0–36.0)
MCV: 98.9 fL (ref 80.0–100.0)
Monocytes Absolute: 1.1 10*3/uL — ABNORMAL HIGH (ref 0.2–1.0)
Monocytes Relative: 13 %
Neutro Abs: 5.7 10*3/uL (ref 1.4–6.5)
Neutrophils Relative %: 66 %
Platelets: 229 10*3/uL (ref 150–440)
RBC: 3.75 MIL/uL — ABNORMAL LOW (ref 4.40–5.90)
RDW: 14 % (ref 11.5–14.5)
WBC: 8.6 10*3/uL (ref 3.8–10.6)

## 2018-01-18 LAB — COMPREHENSIVE METABOLIC PANEL
ALT: 41 U/L (ref 0–44)
AST: 44 U/L — ABNORMAL HIGH (ref 15–41)
Albumin: 3.6 g/dL (ref 3.5–5.0)
Alkaline Phosphatase: 73 U/L (ref 38–126)
Anion gap: 8 (ref 5–15)
BUN: 11 mg/dL (ref 8–23)
CO2: 30 mmol/L (ref 22–32)
Calcium: 9.5 mg/dL (ref 8.9–10.3)
Chloride: 95 mmol/L — ABNORMAL LOW (ref 98–111)
Creatinine, Ser: 0.98 mg/dL (ref 0.61–1.24)
GFR calc Af Amer: >60 mL/min (ref >60)
GFR calc non Af Amer: >60 mL/min (ref >60)
Glucose, Bld: 94 mg/dL (ref 70–99)
Potassium: 4.8 mmol/L (ref 3.5–5.1)
Sodium: 133 mmol/L — ABNORMAL LOW (ref 135–145)
Total Bilirubin: 0.4 mg/dL (ref 0.3–1.2)
Total Protein: 6.6 g/dL (ref 6.5–8.1)

## 2018-01-18 NOTE — Patient Instructions (Signed)
Your procedure is scheduled on: Thursday January 25, 2018 Report to Day Surgery on the 2nd floor of the [facility]. To find out your arrival time, please call [phone] between 1PM - 3PM on: Wednesday January 24, 2018   REMEMBER: Instructions that are not followed completely may result in serious medical risk, up to and including death; or upon the discretion of your surgeon and anesthesiologist your surgery may need to be rescheduled.  Do not eat food after midnight the night before surgery.  No gum chewing, lozengers or hard candies.  You may however, drink CLEAR liquids up to 2 hours before you are scheduled to arrive for your surgery. Do not drink anything within 2 hours of the start of your surgery.  Clear liquids include: - water  - apple juice without pulp - CLEAR gatorade - black coffee or tea (Do NOT add milk or creamers to the coffee or tea) Do NOT drink anything that is not on this list.  No Alcohol for 24 hours before or after surgery.  No Smoking including e-cigarettes for 24 hours prior to surgery.  No chewable tobacco products for at least 6 hours prior to surgery.  No nicotine patches on the day of surgery.  On the morning of surgery brush your teeth with toothpaste and water, you may rinse your mouth with mouthwash if you wish. Do not swallow any toothpaste or mouthwash.  Notify your doctor if there is any change in your medical condition (cold, fever, infection).  Do not wear jewelry, make-up, hairpins, clips or nail polish.  Do not wear lotions, powders, or perfumes. You may NOT wear deodorant.  Do not shave 48 hours prior to surgery. Men may shave face and neck.  Contacts and dentures may not be worn into surgery.  Do not bring valuables to the hospital, including drivers license, insurance or credit cards.  [facility] is not responsible for any belongings or valuables.   TAKE THESE MEDICATIONS THE MORNING OF SURGERY: AMIODARONE DIGOXIN IF A  MORNING MED   USE FLONASE  Use  or wipes as directed on instruction sheet.   Follow recommendations from Cardiologist, Pulmonologist or PCP regarding stopping Aspirin, Coumadin, Plavix, Eliquis, Pradaxa, or Pletal. LAST DOSE OF ASPIRIN 01-19-2018  LAST DOSE OF ELIQUIS IS 01-21-2018  Stop Anti-inflammatories (NSAIDS) such as Advil, Aleve, Ibuprofen, Motrin, Naproxen, Naprosyn and Aspirin based products such as Excedrin, Goodys Powder, BC Powder. (May take Tylenol or Acetaminophen if needed.)  Stop ANY OVER THE COUNTER supplements until after surgery - NIACIN  (May continue Vitamin D, Vitamin B, and multivitamin.)  Wear comfortable clothing (specific to your surgery type) to the hospital.  Plan for stool softeners for home use.  If you are being discharged the day of surgery, you will not be allowed to drive home. You will need a responsible adult to drive you home and stay with you that night.   If you are taking public transportation, you will need to have a responsible adult with you. Please confirm with your physician that it is acceptable to use public transportation.   Please call [phone] if you have any questions about these instructions.

## 2018-01-18 NOTE — Pre-Procedure Instructions (Signed)
Consulted with Dr Andree Elk Anesthesiologist per Dr Dahlia Byes request. Faythe Ghee to proceed.

## 2018-01-19 ENCOUNTER — Emergency Department
Admission: EM | Admit: 2018-01-19 | Discharge: 2018-01-19 | Disposition: A | Discharge status: Home / Self Care | Payer: Medicare Other | Attending: Emergency Medicine | Admitting: Emergency Medicine

## 2018-01-19 ENCOUNTER — Emergency Department: Payer: Medicare Other

## 2018-01-19 ENCOUNTER — Telehealth: Payer: Self-pay

## 2018-01-19 ENCOUNTER — Encounter: Payer: Self-pay | Admitting: Emergency Medicine

## 2018-01-19 ENCOUNTER — Other Ambulatory Visit: Payer: Self-pay

## 2018-01-19 DIAGNOSIS — Z79899 Other long term (current) drug therapy: Secondary | ICD-10-CM | POA: Diagnosis not present

## 2018-01-19 DIAGNOSIS — J984 Other disorders of lung: Secondary | ICD-10-CM | POA: Diagnosis not present

## 2018-01-19 DIAGNOSIS — Y939 Activity, unspecified: Secondary | ICD-10-CM | POA: Insufficient documentation

## 2018-01-19 DIAGNOSIS — S0990XA Unspecified injury of head, initial encounter: Secondary | ICD-10-CM | POA: Insufficient documentation

## 2018-01-19 DIAGNOSIS — I10 Essential (primary) hypertension: Secondary | ICD-10-CM | POA: Diagnosis not present

## 2018-01-19 DIAGNOSIS — W19XXXA Unspecified fall, initial encounter: Secondary | ICD-10-CM

## 2018-01-19 DIAGNOSIS — R42 Dizziness and giddiness: Secondary | ICD-10-CM | POA: Diagnosis not present

## 2018-01-19 DIAGNOSIS — I252 Old myocardial infarction: Secondary | ICD-10-CM | POA: Insufficient documentation

## 2018-01-19 DIAGNOSIS — Z7982 Long term (current) use of aspirin: Secondary | ICD-10-CM | POA: Insufficient documentation

## 2018-01-19 DIAGNOSIS — Y999 Unspecified external cause status: Secondary | ICD-10-CM | POA: Insufficient documentation

## 2018-01-19 DIAGNOSIS — S199XXA Unspecified injury of neck, initial encounter: Secondary | ICD-10-CM | POA: Diagnosis not present

## 2018-01-19 DIAGNOSIS — W07XXXA Fall from chair, initial encounter: Secondary | ICD-10-CM | POA: Insufficient documentation

## 2018-01-19 DIAGNOSIS — I2510 Atherosclerotic heart disease of native coronary artery without angina pectoris: Secondary | ICD-10-CM | POA: Diagnosis not present

## 2018-01-19 DIAGNOSIS — Z7901 Long term (current) use of anticoagulants: Secondary | ICD-10-CM | POA: Insufficient documentation

## 2018-01-19 DIAGNOSIS — Y929 Unspecified place or not applicable: Secondary | ICD-10-CM | POA: Diagnosis not present

## 2018-01-19 LAB — URINALYSIS, COMPLETE (UACMP) WITH MICROSCOPIC
Bacteria, UA: NONE SEEN
Bilirubin Urine: NEGATIVE
Glucose, UA: NEGATIVE mg/dL
Hgb urine dipstick: NEGATIVE
Ketones, ur: NEGATIVE mg/dL
Leukocytes, UA: NEGATIVE
Nitrite: NEGATIVE
Protein, ur: NEGATIVE mg/dL
Specific Gravity, Urine: 1.015 (ref 1.005–1.030)
Squamous Epithelial / HPF: NONE SEEN (ref 0–5)
pH: 6 (ref 5.0–8.0)

## 2018-01-19 MED ORDER — SODIUM CHLORIDE 0.9 % IV BOLUS
500.0000 mL | Freq: Once | INTRAVENOUS | Status: AC
  Administered 2018-01-19: 500 mL via INTRAVENOUS

## 2018-01-19 NOTE — Telephone Encounter (Signed)
Agreed he is on ASA and Eliquis and should have a CT of head to r/o bleed
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
## 2018-01-19 NOTE — ED Triage Notes (Signed)
Pt arrived with family after mechanical fall up stairs causing pt to hit his head. Pt denies loc but was concerned due to pt being on a blood thinner. No neuro deficits noted in triage.

## 2018-01-19 NOTE — ED Notes (Signed)
Pt reports understanding of discahrge instructions reports will follow up with provider as recommended

## 2018-01-19 NOTE — Telephone Encounter (Signed)
Patients daughter called office with concerns of fall that occurred at 11:20AM. Daughter states that her and patient were outside of home walking, and when they started to walk up steps to home patient had lost his balance when trying to grab for chair at top of stairs to pull him up. Daughter states that patient had fell hitting the left side of his head on concrete, daughter states that patient is alert and is not disoriented or having difficulty with speech. She states that when patient tries standing he complains of dizziness and is unable to walk. She states that she has tried lifting patient up to stand but he immediately sits down due to dizziness. Reviewed with patients PCP [name] PA who advised that it is best that patient seek medical attention at nearest ED for evaluation and treatment for fall. Patients daughter was advised and states that she is taking patient to ED now, she was instructed to call office back if she has any further questions or concerns.KW

## 2018-01-19 NOTE — ED Notes (Signed)
See triage note  Presents s/p fall  States he was going up the stairs fell hitting his head  Family states he was dizzy when getting after the fall   No abrasions or lacerations noted states he feel much better now

## 2018-01-19 NOTE — ED Triage Notes (Signed)
First Nurse Note:  Family brings patient in for ED evaluation, s/o fall from chair and hit head on porch about 90 minutes PTA.  Patient only complaint is feeling dizzy when standing up.  Patient takes a blood thinner, takes Eliquis.  AAOx3.  Skin warm and dry. NAD

## 2018-01-24 MED ORDER — CEFAZOLIN SODIUM-DEXTROSE 2-4 GM/100ML-% IV SOLN
2.0000 g | INTRAVENOUS | Status: AC
  Administered 2018-01-25: 2 g via INTRAVENOUS

## 2018-01-25 ENCOUNTER — Ambulatory Visit: Payer: Medicare Other

## 2018-01-25 ENCOUNTER — Encounter
Admission: RE | Disposition: A | Discharge status: Skilled Nursing Facility | Payer: Self-pay | Source: Home / Self Care | Attending: Surgery

## 2018-01-25 ENCOUNTER — Other Ambulatory Visit: Payer: Self-pay

## 2018-01-25 ENCOUNTER — Ambulatory Visit: Payer: Medicare Other | Admitting: Certified Registered"

## 2018-01-25 ENCOUNTER — Inpatient Hospital Stay
Admission: RE | Admit: 2018-01-25 | Discharge: 2018-01-31 | DRG: 414 | Disposition: A | Discharge status: Skilled Nursing Facility | Payer: Medicare Other | Attending: Surgery | Admitting: Surgery

## 2018-01-25 DIAGNOSIS — Z7901 Long term (current) use of anticoagulants: Secondary | ICD-10-CM

## 2018-01-25 DIAGNOSIS — E785 Hyperlipidemia, unspecified: Secondary | ICD-10-CM | POA: Diagnosis not present

## 2018-01-25 DIAGNOSIS — K820 Obstruction of gallbladder: Secondary | ICD-10-CM | POA: Diagnosis present

## 2018-01-25 DIAGNOSIS — I480 Paroxysmal atrial fibrillation: Secondary | ICD-10-CM | POA: Diagnosis present

## 2018-01-25 DIAGNOSIS — M6281 Muscle weakness (generalized): Secondary | ICD-10-CM | POA: Diagnosis not present

## 2018-01-25 DIAGNOSIS — K91840 Postprocedural hemorrhage and hematoma of a digestive system organ or structure following a digestive system procedure: Secondary | ICD-10-CM | POA: Diagnosis not present

## 2018-01-25 DIAGNOSIS — Z419 Encounter for procedure for purposes other than remedying health state, unspecified: Secondary | ICD-10-CM

## 2018-01-25 DIAGNOSIS — Z8249 Family history of ischemic heart disease and other diseases of the circulatory system: Secondary | ICD-10-CM

## 2018-01-25 DIAGNOSIS — I959 Hypotension, unspecified: Secondary | ICD-10-CM | POA: Diagnosis not present

## 2018-01-25 DIAGNOSIS — Z23 Encounter for immunization: Secondary | ICD-10-CM

## 2018-01-25 DIAGNOSIS — I951 Orthostatic hypotension: Secondary | ICD-10-CM | POA: Diagnosis not present

## 2018-01-25 DIAGNOSIS — G44229 Chronic tension-type headache, not intractable: Secondary | ICD-10-CM | POA: Diagnosis not present

## 2018-01-25 DIAGNOSIS — D519 Vitamin B12 deficiency anemia, unspecified: Secondary | ICD-10-CM | POA: Diagnosis not present

## 2018-01-25 DIAGNOSIS — Z7982 Long term (current) use of aspirin: Secondary | ICD-10-CM | POA: Diagnosis not present

## 2018-01-25 DIAGNOSIS — Z8614 Personal history of Methicillin resistant Staphylococcus aureus infection: Secondary | ICD-10-CM | POA: Diagnosis not present

## 2018-01-25 DIAGNOSIS — K819 Cholecystitis, unspecified: Secondary | ICD-10-CM | POA: Diagnosis present

## 2018-01-25 DIAGNOSIS — I252 Old myocardial infarction: Secondary | ICD-10-CM | POA: Diagnosis not present

## 2018-01-25 DIAGNOSIS — Z8546 Personal history of malignant neoplasm of prostate: Secondary | ICD-10-CM

## 2018-01-25 DIAGNOSIS — Z9049 Acquired absence of other specified parts of digestive tract: Secondary | ICD-10-CM | POA: Diagnosis not present

## 2018-01-25 DIAGNOSIS — D62 Acute posthemorrhagic anemia: Secondary | ICD-10-CM | POA: Diagnosis not present

## 2018-01-25 DIAGNOSIS — F329 Major depressive disorder, single episode, unspecified: Secondary | ICD-10-CM | POA: Diagnosis present

## 2018-01-25 DIAGNOSIS — K810 Acute cholecystitis: Secondary | ICD-10-CM

## 2018-01-25 DIAGNOSIS — K812 Acute cholecystitis with chronic cholecystitis: Secondary | ICD-10-CM | POA: Diagnosis present

## 2018-01-25 DIAGNOSIS — J302 Other seasonal allergic rhinitis: Secondary | ICD-10-CM | POA: Diagnosis not present

## 2018-01-25 DIAGNOSIS — R55 Syncope and collapse: Secondary | ICD-10-CM

## 2018-01-25 DIAGNOSIS — Z951 Presence of aortocoronary bypass graft: Secondary | ICD-10-CM

## 2018-01-25 DIAGNOSIS — R918 Other nonspecific abnormal finding of lung field: Secondary | ICD-10-CM | POA: Diagnosis not present

## 2018-01-25 DIAGNOSIS — R062 Wheezing: Secondary | ICD-10-CM

## 2018-01-25 DIAGNOSIS — I340 Nonrheumatic mitral (valve) insufficiency: Secondary | ICD-10-CM | POA: Diagnosis not present

## 2018-01-25 DIAGNOSIS — K8012 Calculus of gallbladder with acute and chronic cholecystitis without obstruction: Secondary | ICD-10-CM | POA: Diagnosis not present

## 2018-01-25 DIAGNOSIS — K8020 Calculus of gallbladder without cholecystitis without obstruction: Secondary | ICD-10-CM | POA: Diagnosis not present

## 2018-01-25 DIAGNOSIS — J189 Pneumonia, unspecified organism: Secondary | ICD-10-CM | POA: Diagnosis not present

## 2018-01-25 DIAGNOSIS — Z8551 Personal history of malignant neoplasm of bladder: Secondary | ICD-10-CM | POA: Diagnosis not present

## 2018-01-25 DIAGNOSIS — Z833 Family history of diabetes mellitus: Secondary | ICD-10-CM

## 2018-01-25 DIAGNOSIS — I10 Essential (primary) hypertension: Secondary | ICD-10-CM | POA: Diagnosis not present

## 2018-01-25 DIAGNOSIS — E872 Acidosis: Secondary | ICD-10-CM | POA: Diagnosis not present

## 2018-01-25 DIAGNOSIS — I2510 Atherosclerotic heart disease of native coronary artery without angina pectoris: Secondary | ICD-10-CM | POA: Diagnosis not present

## 2018-01-25 DIAGNOSIS — J690 Pneumonitis due to inhalation of food and vomit: Secondary | ICD-10-CM | POA: Diagnosis not present

## 2018-01-25 DIAGNOSIS — R6521 Severe sepsis with septic shock: Secondary | ICD-10-CM | POA: Diagnosis not present

## 2018-01-25 DIAGNOSIS — D649 Anemia, unspecified: Secondary | ICD-10-CM | POA: Diagnosis not present

## 2018-01-25 DIAGNOSIS — R410 Disorientation, unspecified: Secondary | ICD-10-CM | POA: Diagnosis not present

## 2018-01-25 DIAGNOSIS — F419 Anxiety disorder, unspecified: Secondary | ICD-10-CM | POA: Diagnosis present

## 2018-01-25 DIAGNOSIS — I4891 Unspecified atrial fibrillation: Secondary | ICD-10-CM | POA: Diagnosis not present

## 2018-01-25 DIAGNOSIS — R739 Hyperglycemia, unspecified: Secondary | ICD-10-CM | POA: Diagnosis not present

## 2018-01-25 DIAGNOSIS — A419 Sepsis, unspecified organism: Secondary | ICD-10-CM | POA: Diagnosis not present

## 2018-01-25 DIAGNOSIS — F3289 Other specified depressive episodes: Secondary | ICD-10-CM | POA: Diagnosis not present

## 2018-01-25 HISTORY — PX: CHOLECYSTECTOMY: SHX55

## 2018-01-25 HISTORY — PX: LAPAROTOMY: SHX154

## 2018-01-25 LAB — CREATININE, SERUM
Creatinine, Ser: 1.1 mg/dL (ref 0.61–1.24)
GFR calc Af Amer: >60 mL/min (ref >60)
GFR calc non Af Amer: 59 mL/min — ABNORMAL LOW (ref >60)

## 2018-01-25 LAB — LACTIC ACID, PLASMA: Lactic Acid, Venous: 5.3 mmol/L (ref 0.5–1.9)

## 2018-01-25 LAB — CBC
HCT: 31.9 % — ABNORMAL LOW (ref 40.0–52.0)
Hemoglobin: 10.7 g/dL — ABNORMAL LOW (ref 13.0–18.0)
MCH: 33.3 pg (ref 26.0–34.0)
MCHC: 33.5 g/dL (ref 32.0–36.0)
MCV: 99.2 fL (ref 80.0–100.0)
Platelets: 250 10*3/uL (ref 150–440)
RBC: 3.22 MIL/uL — ABNORMAL LOW (ref 4.40–5.90)
RDW: 14.2 % (ref 11.5–14.5)
WBC: 18.7 10*3/uL — ABNORMAL HIGH (ref 3.8–10.6)

## 2018-01-25 LAB — CBC WITH DIFFERENTIAL/PLATELET
Basophils Absolute: 0 10*3/uL (ref 0–0.1)
Basophils Relative: 0 %
Eosinophils Absolute: 0 10*3/uL (ref 0–0.7)
Eosinophils Relative: 0 %
HCT: 27.6 % — ABNORMAL LOW (ref 40.0–52.0)
Hemoglobin: 9.3 g/dL — ABNORMAL LOW (ref 13.0–18.0)
Lymphocytes Relative: 2 %
Lymphs Abs: 0.5 10*3/uL — ABNORMAL LOW (ref 1.0–3.6)
MCH: 33.9 pg (ref 26.0–34.0)
MCHC: 33.7 g/dL (ref 32.0–36.0)
MCV: 100.6 fL — ABNORMAL HIGH (ref 80.0–100.0)
Monocytes Absolute: 1.6 10*3/uL — ABNORMAL HIGH (ref 0.2–1.0)
Monocytes Relative: 7 %
Neutro Abs: 21.9 10*3/uL — ABNORMAL HIGH (ref 1.4–6.5)
Neutrophils Relative %: 91 %
Platelets: 237 10*3/uL (ref 150–440)
RBC: 2.74 MIL/uL — ABNORMAL LOW (ref 4.40–5.90)
RDW: 13.8 % (ref 11.5–14.5)
WBC: 24.1 10*3/uL — ABNORMAL HIGH (ref 3.8–10.6)

## 2018-01-25 LAB — COMPREHENSIVE METABOLIC PANEL
ALT: 105 U/L — ABNORMAL HIGH (ref 0–44)
AST: 158 U/L — ABNORMAL HIGH (ref 15–41)
Albumin: 2.9 g/dL — ABNORMAL LOW (ref 3.5–5.0)
Alkaline Phosphatase: 61 U/L (ref 38–126)
Anion gap: 11 (ref 5–15)
BUN: 17 mg/dL (ref 8–23)
CO2: 21 mmol/L — ABNORMAL LOW (ref 22–32)
Calcium: 8.1 mg/dL — ABNORMAL LOW (ref 8.9–10.3)
Chloride: 103 mmol/L (ref 98–111)
Creatinine, Ser: 1.27 mg/dL — ABNORMAL HIGH (ref 0.61–1.24)
GFR calc Af Amer: 57 mL/min — ABNORMAL LOW (ref >60)
GFR calc non Af Amer: 49 mL/min — ABNORMAL LOW (ref >60)
Glucose, Bld: 222 mg/dL — ABNORMAL HIGH (ref 70–99)
Potassium: 5.1 mmol/L (ref 3.5–5.1)
Sodium: 135 mmol/L (ref 135–145)
Total Bilirubin: 0.8 mg/dL (ref 0.3–1.2)
Total Protein: 5.1 g/dL — ABNORMAL LOW (ref 6.5–8.1)

## 2018-01-25 LAB — GLUCOSE, CAPILLARY
Glucose-Capillary: 180 mg/dL — ABNORMAL HIGH (ref 70–99)
Glucose-Capillary: 194 mg/dL — ABNORMAL HIGH (ref 70–99)
Glucose-Capillary: 224 mg/dL — ABNORMAL HIGH (ref 70–99)

## 2018-01-25 LAB — TROPONIN I: Troponin I: <0.03 ng/mL (ref <0.03)

## 2018-01-25 LAB — MRSA PCR SCREENING: MRSA by PCR: NEGATIVE

## 2018-01-25 LAB — APTT: aPTT: 27 seconds (ref 24–36)

## 2018-01-25 LAB — PROTIME-INR
INR: 1.01
Prothrombin Time: 13.2 seconds (ref 11.4–15.2)

## 2018-01-25 SURGERY — LAPAROSCOPIC CHOLECYSTECTOMY WITH INTRAOPERATIVE CHOLANGIOGRAM
Anesthesia: General

## 2018-01-25 SURGERY — LAPAROTOMY, EXPLORATORY
Anesthesia: General | Site: Abdomen

## 2018-01-25 MED ORDER — CEFAZOLIN SODIUM 1 G IJ SOLR
INTRAMUSCULAR | Status: AC
  Filled 2018-01-25: qty 20

## 2018-01-25 MED ORDER — PROCHLORPERAZINE MALEATE 10 MG PO TABS
10.0000 mg | ORAL_TABLET | Freq: Four times a day (QID) | ORAL | Status: DC | PRN
  Filled 2018-01-25: qty 1

## 2018-01-25 MED ORDER — ONDANSETRON HCL 4 MG/2ML IJ SOLN
INTRAMUSCULAR | Status: AC
  Filled 2018-01-25: qty 2

## 2018-01-25 MED ORDER — DEXAMETHASONE SODIUM PHOSPHATE 10 MG/ML IJ SOLN
INTRAMUSCULAR | Status: AC
  Filled 2018-01-25: qty 1

## 2018-01-25 MED ORDER — SODIUM CHLORIDE 0.9 % IV SOLN
INTRAVENOUS | Status: DC | PRN
  Administered 2018-01-25: 11:00:00

## 2018-01-25 MED ORDER — FENTANYL CITRATE (PF) 100 MCG/2ML IJ SOLN
INTRAMUSCULAR | Status: DC | PRN
  Administered 2018-01-25 (×3): 50 ug via INTRAVENOUS

## 2018-01-25 MED ORDER — ACETAMINOPHEN 500 MG PO TABS
1000.0000 mg | ORAL_TABLET | Freq: Four times a day (QID) | ORAL | Status: DC
  Administered 2018-01-25 – 2018-01-30 (×17): 1000 mg via ORAL
  Filled 2018-01-25 (×18): qty 2

## 2018-01-25 MED ORDER — LACTATED RINGERS IV SOLN
INTRAVENOUS | Status: DC
  Administered 2018-01-25 (×3): via INTRAVENOUS

## 2018-01-25 MED ORDER — SUGAMMADEX SODIUM 200 MG/2ML IV SOLN
INTRAVENOUS | Status: DC | PRN
  Administered 2018-01-25: 200 mg via INTRAVENOUS

## 2018-01-25 MED ORDER — ONDANSETRON HCL 4 MG/2ML IJ SOLN
INTRAMUSCULAR | Status: DC | PRN
  Administered 2018-01-25: 4 mg via INTRAVENOUS

## 2018-01-25 MED ORDER — PROCHLORPERAZINE EDISYLATE 10 MG/2ML IJ SOLN
5.0000 mg | Freq: Four times a day (QID) | INTRAMUSCULAR | Status: DC | PRN
  Filled 2018-01-25: qty 2

## 2018-01-25 MED ORDER — CHLORHEXIDINE GLUCONATE CLOTH 2 % EX PADS: 6.0000 | MEDICATED_PAD | Freq: Once | CUTANEOUS | Status: DC

## 2018-01-25 MED ORDER — LIDOCAINE HCL (PF) 2 % IJ SOLN
INTRAMUSCULAR | Status: AC
  Filled 2018-01-25: qty 10

## 2018-01-25 MED ORDER — IOPAMIDOL (ISOVUE-M 200) INJECTION 41%
INTRAMUSCULAR | Status: AC
  Filled 2018-01-25: qty 10

## 2018-01-25 MED ORDER — PROPOFOL 10 MG/ML IV BOLUS
INTRAVENOUS | Status: AC
  Filled 2018-01-25: qty 20

## 2018-01-25 MED ORDER — PANTOPRAZOLE SODIUM 40 MG PO TBEC
40.0000 mg | DELAYED_RELEASE_TABLET | Freq: Every day | ORAL | Status: DC
  Administered 2018-01-25 – 2018-01-31 (×7): 40 mg via ORAL
  Filled 2018-01-25 (×7): qty 1

## 2018-01-25 MED ORDER — FAMOTIDINE 20 MG PO TABS
20.0000 mg | ORAL_TABLET | Freq: Once | ORAL | Status: AC
  Administered 2018-01-25: 20 mg via ORAL

## 2018-01-25 MED ORDER — FENTANYL CITRATE (PF) 100 MCG/2ML IJ SOLN
INTRAMUSCULAR | Status: AC
  Filled 2018-01-25: qty 2

## 2018-01-25 MED ORDER — DEXAMETHASONE SODIUM PHOSPHATE 10 MG/ML IJ SOLN
INTRAMUSCULAR | Status: DC | PRN
  Administered 2018-01-25: 6 mg via INTRAVENOUS

## 2018-01-25 MED ORDER — IOPAMIDOL (ISOVUE-300) INJECTION 61%
INTRAVENOUS | Status: DC | PRN
  Administered 2018-01-25: 10 mL

## 2018-01-25 MED ORDER — KETOROLAC TROMETHAMINE 15 MG/ML IJ SOLN
15.0000 mg | Freq: Once | INTRAMUSCULAR | Status: AC
  Administered 2018-01-25: 15 mg via INTRAVENOUS
  Filled 2018-01-25: qty 1

## 2018-01-25 MED ORDER — CEFAZOLIN SODIUM-DEXTROSE 2-4 GM/100ML-% IV SOLN
2.0000 g | INTRAVENOUS | Status: AC
  Administered 2018-01-26: 2 g via INTRAVENOUS
  Filled 2018-01-25: qty 100

## 2018-01-25 MED ORDER — PROPOFOL 10 MG/ML IV BOLUS
INTRAVENOUS | Status: DC | PRN
  Administered 2018-01-25: 150 mg via INTRAVENOUS

## 2018-01-25 MED ORDER — HYDRALAZINE HCL 20 MG/ML IJ SOLN: 10.0000 mg | INTRAMUSCULAR | Status: DC | PRN

## 2018-01-25 MED ORDER — ONDANSETRON 4 MG PO TBDP
4.0000 mg | ORAL_TABLET | Freq: Four times a day (QID) | ORAL | Status: DC | PRN
  Filled 2018-01-25: qty 1

## 2018-01-25 MED ORDER — ONDANSETRON HCL 4 MG/2ML IJ SOLN: 4.0000 mg | Freq: Once | INTRAMUSCULAR | Status: DC | PRN

## 2018-01-25 MED ORDER — CEFAZOLIN SODIUM-DEXTROSE 2-4 GM/100ML-% IV SOLN
INTRAVENOUS | Status: AC
  Filled 2018-01-25: qty 100

## 2018-01-25 MED ORDER — HEPARIN SODIUM (PORCINE) 5000 UNIT/ML IJ SOLN: 5000.0000 [IU] | Freq: Three times a day (TID) | INTRAMUSCULAR | Status: DC

## 2018-01-25 MED ORDER — KETAMINE HCL 50 MG/ML IJ SOLN
INTRAMUSCULAR | Status: AC
  Filled 2018-01-25: qty 10

## 2018-01-25 MED ORDER — SODIUM CHLORIDE 0.9 % IJ SOLN
INTRAMUSCULAR | Status: AC
  Filled 2018-01-25: qty 50

## 2018-01-25 MED ORDER — PNEUMOCOCCAL VAC POLYVALENT 25 MCG/0.5ML IJ INJ
0.5000 mL | INJECTION | INTRAMUSCULAR | Status: AC
  Administered 2018-01-29: 0.5 mL via INTRAMUSCULAR
  Filled 2018-01-25: qty 0.5

## 2018-01-25 MED ORDER — ONDANSETRON HCL 4 MG/2ML IJ SOLN: 4.0000 mg | Freq: Four times a day (QID) | INTRAMUSCULAR | Status: DC | PRN

## 2018-01-25 MED ORDER — LACTATED RINGERS IV SOLN
INTRAVENOUS | Status: DC
  Administered 2018-01-25 – 2018-01-26 (×3): via INTRAVENOUS

## 2018-01-25 MED ORDER — AMIODARONE HCL 200 MG PO TABS
200.0000 mg | ORAL_TABLET | Freq: Every day | ORAL | Status: DC
  Administered 2018-01-26 – 2018-01-31 (×6): 200 mg via ORAL
  Filled 2018-01-25 (×6): qty 1

## 2018-01-25 MED ORDER — INSULIN ASPART 100 UNIT/ML ~~LOC~~ SOLN
0.0000 [IU] | SUBCUTANEOUS | Status: DC
  Administered 2018-01-25: 2 [IU] via SUBCUTANEOUS
  Administered 2018-01-26: 1 [IU] via SUBCUTANEOUS
  Filled 2018-01-25 (×2): qty 1

## 2018-01-25 MED ORDER — BUPIVACAINE-EPINEPHRINE 0.25% -1:200000 IJ SOLN
INTRAMUSCULAR | Status: DC | PRN
  Administered 2018-01-25: 30 mL

## 2018-01-25 MED ORDER — SUCCINYLCHOLINE CHLORIDE 20 MG/ML IJ SOLN
INTRAMUSCULAR | Status: AC
  Filled 2018-01-25: qty 1

## 2018-01-25 MED ORDER — INFLUENZA VAC SPLIT HIGH-DOSE 0.5 ML IM SUSY
0.5000 mL | PREFILLED_SYRINGE | INTRAMUSCULAR | Status: DC
  Filled 2018-01-25: qty 0.5

## 2018-01-25 MED ORDER — FENTANYL CITRATE (PF) 100 MCG/2ML IJ SOLN
INTRAMUSCULAR | Status: AC
  Administered 2018-01-25: 25 ug via INTRAVENOUS
  Filled 2018-01-25: qty 2

## 2018-01-25 MED ORDER — LIDOCAINE HCL (CARDIAC) PF 100 MG/5ML IV SOSY
PREFILLED_SYRINGE | INTRAVENOUS | Status: DC | PRN
  Administered 2018-01-25: 100 mg via INTRAVENOUS

## 2018-01-25 MED ORDER — METOPROLOL SUCCINATE ER 50 MG PO TB24
25.0000 mg | ORAL_TABLET | Freq: Every evening | ORAL | Status: DC
  Administered 2018-01-25: 25 mg via ORAL
  Filled 2018-01-25: qty 1

## 2018-01-25 MED ORDER — EVICEL 2 ML EX KIT
PACK | CUTANEOUS | Status: AC
  Filled 2018-01-25: qty 1

## 2018-01-25 MED ORDER — DIGOXIN 125 MCG PO TABS
0.1250 mg | ORAL_TABLET | Freq: Every day | ORAL | Status: DC
  Administered 2018-01-26 – 2018-01-31 (×6): 0.125 mg via ORAL
  Filled 2018-01-25 (×6): qty 1

## 2018-01-25 MED ORDER — FENTANYL CITRATE (PF) 100 MCG/2ML IJ SOLN
INTRAMUSCULAR | Status: AC
  Filled 2018-01-25: qty 4

## 2018-01-25 MED ORDER — FENTANYL CITRATE (PF) 100 MCG/2ML IJ SOLN
25.0000 ug | INTRAMUSCULAR | Status: DC | PRN
  Administered 2018-01-25 (×4): 25 ug via INTRAVENOUS

## 2018-01-25 MED ORDER — ROCURONIUM BROMIDE 100 MG/10ML IV SOLN
INTRAVENOUS | Status: DC | PRN
  Administered 2018-01-25: 20 mg via INTRAVENOUS
  Administered 2018-01-25: 50 mg via INTRAVENOUS

## 2018-01-25 MED ORDER — FLUTICASONE PROPIONATE 50 MCG/ACT NA SUSP
2.0000 | Freq: Every day | NASAL | Status: DC
  Administered 2018-01-30 – 2018-01-31 (×2): 2 via NASAL
  Filled 2018-01-25 (×3): qty 16

## 2018-01-25 MED ORDER — PHENYLEPHRINE HCL 10 MG/ML IJ SOLN
INTRAMUSCULAR | Status: DC | PRN
  Administered 2018-01-25 (×3): 200 ug via INTRAVENOUS

## 2018-01-25 MED ORDER — PHENYLEPHRINE HCL-NACL 10-0.9 MG/250ML-% IV SOLN
0.0000 ug/min | INTRAVENOUS | Status: DC
  Filled 2018-01-25: qty 250

## 2018-01-25 MED ORDER — BUPIVACAINE-EPINEPHRINE (PF) 0.25% -1:200000 IJ SOLN
INTRAMUSCULAR | Status: AC
  Filled 2018-01-25: qty 30

## 2018-01-25 MED ORDER — LACTATED RINGERS IV BOLUS
1000.0000 mL | Freq: Once | INTRAVENOUS | Status: AC
  Administered 2018-01-25: 1000 mL via INTRAVENOUS

## 2018-01-25 MED ORDER — DIPHENHYDRAMINE HCL 25 MG PO CAPS
25.0000 mg | ORAL_CAPSULE | Freq: Every evening | ORAL | Status: DC | PRN
  Filled 2018-01-25: qty 1

## 2018-01-25 MED ORDER — MORPHINE SULFATE (PF) 2 MG/ML IV SOLN
2.0000 mg | INTRAVENOUS | Status: DC | PRN
  Administered 2018-01-25 – 2018-01-27 (×11): 2 mg via INTRAVENOUS
  Filled 2018-01-25 (×11): qty 1

## 2018-01-25 MED ORDER — EPHEDRINE SULFATE 50 MG/ML IJ SOLN
INTRAMUSCULAR | Status: DC | PRN
  Administered 2018-01-25 (×5): 10 mg via INTRAVENOUS

## 2018-01-25 MED ORDER — OXYCODONE HCL 5 MG PO TABS
5.0000 mg | ORAL_TABLET | ORAL | Status: DC | PRN
  Administered 2018-01-25: 10 mg via ORAL
  Administered 2018-01-26: 5 mg via ORAL
  Administered 2018-01-27: 10 mg via ORAL
  Administered 2018-01-28 – 2018-01-30 (×2): 5 mg via ORAL
  Filled 2018-01-25: qty 2
  Filled 2018-01-25: qty 1
  Filled 2018-01-25 (×2): qty 2
  Filled 2018-01-25: qty 1
  Filled 2018-01-25: qty 2
  Filled 2018-01-25: qty 1

## 2018-01-25 MED ORDER — BUPIVACAINE LIPOSOME 1.3 % IJ SUSP
INTRAMUSCULAR | Status: AC
  Filled 2018-01-25: qty 20

## 2018-01-25 MED ORDER — ROCURONIUM BROMIDE 50 MG/5ML IV SOLN
INTRAVENOUS | Status: AC
  Filled 2018-01-25: qty 1

## 2018-01-25 MED ORDER — TRAZODONE HCL 100 MG PO TABS: 100.0000 mg | ORAL_TABLET | Freq: Every day | ORAL | Status: DC

## 2018-01-25 MED ORDER — FAMOTIDINE 20 MG PO TABS
ORAL_TABLET | ORAL | Status: AC
  Filled 2018-01-25: qty 1

## 2018-01-25 SURGICAL SUPPLY — 54 items
APPLICATOR COTTON TIP 6 STRL (MISCELLANEOUS) IMPLANT
APPLICATOR COTTON TIP 6IN STRL (MISCELLANEOUS)
APPLIER CLIP 5 13 M/L LIGAMAX5 (MISCELLANEOUS) ×3
BLADE SURG 15 STRL LF DISP TIS (BLADE) ×1 IMPLANT
BLADE SURG 15 STRL SS (BLADE) ×2
BULB RESERV EVAC DRAIN JP 100C (MISCELLANEOUS) ×3 IMPLANT
CANISTER SUCT 1200ML W/VALVE (MISCELLANEOUS) ×3 IMPLANT
CHLORAPREP W/TINT 26ML (MISCELLANEOUS) ×3 IMPLANT
CHOLANGIOGRAM CATH TAUT (CATHETERS) ×3 IMPLANT
CLEANER CAUTERY TIP 5X5 PAD (MISCELLANEOUS) ×1 IMPLANT
CLIP APPLIE 5 13 M/L LIGAMAX5 (MISCELLANEOUS) ×1 IMPLANT
DECANTER SPIKE VIAL GLASS SM (MISCELLANEOUS) ×3 IMPLANT
DERMABOND ADVANCED (GAUZE/BANDAGES/DRESSINGS) ×2
DERMABOND ADVANCED .7 DNX12 (GAUZE/BANDAGES/DRESSINGS) ×1 IMPLANT
DRAIN CHANNEL JP 15F RND 16 (MISCELLANEOUS) ×3 IMPLANT
DRAPE C-ARM XRAY 36X54 (DRAPES) ×6 IMPLANT
DRAPE INCISE IOBAN 66X45 STRL (DRAPES) ×3 IMPLANT
ELECT CAUTERY BLADE 6.4 (BLADE) ×3 IMPLANT
ELECT REM PT RETURN 9FT ADLT (ELECTROSURGICAL) ×3
ELECTRODE REM PT RTRN 9FT ADLT (ELECTROSURGICAL) ×1 IMPLANT
GLOVE BIO SURGEON STRL SZ7 (GLOVE) ×15 IMPLANT
GOWN STRL REUS W/ TWL LRG LVL3 (GOWN DISPOSABLE) ×3 IMPLANT
GOWN STRL REUS W/TWL LRG LVL3 (GOWN DISPOSABLE) ×6
IRRIGATION STRYKERFLOW (MISCELLANEOUS) ×1 IMPLANT
IRRIGATOR STRYKERFLOW (MISCELLANEOUS) ×3
IV CATH ANGIO 12GX3 LT BLUE (NEEDLE) ×3 IMPLANT
IV NS 1000ML (IV SOLUTION) ×2
IV NS 1000ML BAXH (IV SOLUTION) ×1 IMPLANT
L-HOOK LAP DISP 36CM (ELECTROSURGICAL) ×3
LHOOK LAP DISP 36CM (ELECTROSURGICAL) ×1 IMPLANT
NEEDLE HYPO 22GX1.5 SAFETY (NEEDLE) ×3 IMPLANT
NS IRRIG 1000ML POUR BTL (IV SOLUTION) ×3 IMPLANT
PACK LAP CHOLECYSTECTOMY (MISCELLANEOUS) ×3 IMPLANT
PAD CLEANER CAUTERY TIP 5X5 (MISCELLANEOUS) ×2
PENCIL ELECTRO HAND CTR (MISCELLANEOUS) ×3 IMPLANT
POUCH SPECIMEN RETRIEVAL 10MM (ENDOMECHANICALS) ×3 IMPLANT
SCISSORS METZENBAUM CVD 33 (INSTRUMENTS) ×3 IMPLANT
SLEEVE ENDOPATH XCEL 5M (ENDOMECHANICALS) ×6 IMPLANT
SOL ANTI-FOG 6CC FOG-OUT (MISCELLANEOUS) IMPLANT
SOL FOG-OUT ANTI-FOG 6CC (MISCELLANEOUS)
SPONGE LAP 18X18 RF (DISPOSABLE) ×6 IMPLANT
STOPCOCK 4 WAY LG BORE MALE ST (IV SETS) ×3 IMPLANT
SUT ETHIBOND 0 MO6 C/R (SUTURE) IMPLANT
SUT ETHILON 3-0 FS-10 30 BLK (SUTURE) ×3
SUT MNCRL AB 4-0 PS2 18 (SUTURE) ×3 IMPLANT
SUT PDS AB 1 CT1 27 (SUTURE) ×3 IMPLANT
SUT VICRYL 0 AB UR-6 (SUTURE) ×6 IMPLANT
SUTURE EHLN 3-0 FS-10 30 BLK (SUTURE) ×1 IMPLANT
SYR 20CC LL (SYRINGE) ×9 IMPLANT
SYS LAPSCP GELPORT 120MM (MISCELLANEOUS) ×3
SYSTEM LAPSCP GELPORT 120MM (MISCELLANEOUS) ×1 IMPLANT
TROCAR XCEL BLUNT TIP 100MML (ENDOMECHANICALS) ×3 IMPLANT
TROCAR XCEL NON-BLD 5MMX100MML (ENDOMECHANICALS) ×3 IMPLANT
TUBING INSUFFLATION (TUBING) ×3 IMPLANT

## 2018-01-25 SURGICAL SUPPLY — 47 items
APPLIER CLIP 11 MED OPEN (CLIP)
APPLIER CLIP 13 LRG OPEN (CLIP)
BARRIER ADH SEPRAFILM 3INX5IN (MISCELLANEOUS) IMPLANT
BLADE CLIPPER SURG (BLADE) ×3 IMPLANT
BLADE SURG SZ10 CARB STEEL (BLADE) ×3 IMPLANT
BULB RESERV EVAC DRAIN JP 100C (MISCELLANEOUS) ×3 IMPLANT
CANISTER SUCT 3000ML PPV (MISCELLANEOUS) ×3 IMPLANT
CHLORAPREP W/TINT 26ML (MISCELLANEOUS) ×3 IMPLANT
CLIP APPLIE 11 MED OPEN (CLIP) IMPLANT
CLIP APPLIE 13 LRG OPEN (CLIP) IMPLANT
DRAIN CHANNEL JP 19F (MISCELLANEOUS) ×3 IMPLANT
DRAPE LAPAROTOMY 100X77 ABD (DRAPES) ×3 IMPLANT
DRAPE TABLE BACK 80X90 (DRAPES) IMPLANT
DRSG TEGADERM 2-3/8X2-3/4 SM (GAUZE/BANDAGES/DRESSINGS) IMPLANT
DRSG TELFA 3X8 NADH (GAUZE/BANDAGES/DRESSINGS) IMPLANT
ELECT BLADE 6.5 EXT (BLADE) ×3 IMPLANT
ELECT REM PT RETURN 9FT ADLT (ELECTROSURGICAL) ×3
ELECTRODE REM PT RTRN 9FT ADLT (ELECTROSURGICAL) ×1 IMPLANT
EVICEL AIRLESS SPRAY ACCES (MISCELLANEOUS) ×3 IMPLANT
GAUZE SPONGE 4X4 12PLY STRL (GAUZE/BANDAGES/DRESSINGS) IMPLANT
GLOVE BIO SURGEON STRL SZ7 (GLOVE) ×12 IMPLANT
GOWN STRL REUS W/ TWL LRG LVL3 (GOWN DISPOSABLE) ×3 IMPLANT
GOWN STRL REUS W/TWL LRG LVL3 (GOWN DISPOSABLE) ×6
HANDLE SUCTION POOLE (INSTRUMENTS) ×1 IMPLANT
HANDLE YANKAUER SUCT BULB TIP (MISCELLANEOUS) ×3 IMPLANT
LIGASURE IMPACT 36 18CM CVD LR (INSTRUMENTS) IMPLANT
NEEDLE HYPO 22GX1.5 SAFETY (NEEDLE) IMPLANT
NEEDLE HYPO 25X1 1.5 SAFETY (NEEDLE) ×3 IMPLANT
PACK BASIN MAJOR ARMC (MISCELLANEOUS) ×3 IMPLANT
RELOAD PROXIMATE 75MM BLUE (ENDOMECHANICALS) IMPLANT
SPONGE LAP 18X18 RF (DISPOSABLE) ×15 IMPLANT
SPONGE LAP 18X36 RFD (DISPOSABLE) IMPLANT
STAPLER PROXIMATE 75MM BLUE (STAPLE) IMPLANT
STAPLER SKIN PROX 35W (STAPLE) ×3 IMPLANT
SUCTION POOLE HANDLE (INSTRUMENTS) ×3
SUT PDS AB 0 CT1 27 (SUTURE) ×9 IMPLANT
SUT SILK 2 0 (SUTURE) ×2
SUT SILK 2 0 SH CR/8 (SUTURE) ×3 IMPLANT
SUT SILK 2 0SH CR/8 30 (SUTURE) ×3 IMPLANT
SUT SILK 2-0 18XBRD TIE 12 (SUTURE) ×1 IMPLANT
SUT VIC AB 0 CT1 36 (SUTURE) ×6 IMPLANT
SUT VIC AB 2-0 SH 27 (SUTURE) ×4
SUT VIC AB 2-0 SH 27XBRD (SUTURE) ×2 IMPLANT
SYR 30ML LL (SYRINGE) ×6 IMPLANT
SYR 3ML LL SCALE MARK (SYRINGE) ×3 IMPLANT
TAPE MICROFOAM 4IN (TAPE) IMPLANT
TRAY FOLEY MTR SLVR 16FR STAT (SET/KITS/TRAYS/PACK) ×3 IMPLANT

## 2018-01-25 NOTE — Interval H&P Note (Signed)
History and Physical Interval Note:  01/25/2018 8:49 AM  Charles Cook  has presented today for surgery, with the diagnosis of acute cholecystitis  The various methods of treatment have been discussed with the patient and family. After consideration of risks, benefits and other options for treatment, the patient has consented to  Procedure(s): LAPAROSCOPIC CHOLECYSTECTOMY WITH INTRAOPERATIVE CHOLANGIOGRAM (N/A) as a surgical intervention .  The patient's history has been reviewed, patient examined, no change in status, stable for surgery.  I have reviewed the patient's chart and labs.  Questions were answered to the patient's satisfaction.     [place]

## 2018-01-25 NOTE — Op Note (Addendum)
Hand Assisted Laparoscopic Cholecystectomy with IOC  Pre-operative Diagnosis: chronic cholecystitis  Post-operative Diagnosis: same  Surgeon: Caroleen Hamman, MD FACS  Anesthesia: Gen. with endotracheal tube  Findings: Acute on chronic Cholecystitis cholangiogram shows obstruction of the cystic duct opacification of the gallbladder  Estimated Blood Loss: 300cc         Drains: 15 FR blake RUQ         Specimens: Gallbladder           Complications: none   Procedure Details  The patient was seen again in the Holding Room. The benefits, complications, treatment options, and expected outcomes were discussed with the patient. The risks of bleeding, infection, recurrence of symptoms, failure to resolve symptoms, bile duct damage, bile duct leak, retained common bile duct stone, bowel injury, any of which could require further surgery and/or ERCP, stent, or papillotomy were reviewed with the patient. The likelihood of improving the patient's symptoms with return to their baseline status is good.  The patient and/or family concurred with the proposed plan, giving informed consent.  The patient was taken to Operating Room, identified as Charles Cook and the procedure verified as Laparoscopic Cholecystectomy.  A Time Out was held and the above information confirmed.  Prior to the induction of general anesthesia, antibiotic prophylaxis was administered. VTE prophylaxis was in place. General endotracheal anesthesia was then administered and tolerated well. After the induction, the abdomen was prepped with Chloraprep and draped in the sterile fashion. The patient was positioned in the supine position.  Cholangiogram was performed injecting contrast into the cholecystostomy tube.  We visualize contrast within the gallbladder but there was obstruction of the cystic duct.  The drain was removed in the standard fashion.  Cut down technique was used to enter the abdominal cavity and a Hasson trochar was  placed after two vicryl stitches were anchored to the fascia. Pneumoperitoneum was then created with CO2 and tolerated well without any adverse changes in the patient's vital signs.  Three 5-mm ports were placed in the right upper quadrant all under direct vision. All skin incisions  were infiltrated with a local anesthetic agent before making the incision and placing the trocars.   The patient was positioned  in reverse Trendelenburg, tilted slightly to the patient's left.  The gallbladder was identified, the fundus grasped and retracted cephalad. Adhesions were lysed bluntly.   Was extensive inflammatory response within the gallbladder.  We also found the gallbladder to be intrahepatic.  Most of the inflammatory response was at the fundus and body of the gallbladder.  The infundibulum and the cystic duct junction had no major inflammatory response.  I started the case laparoscopically and shortly realize that the gallbladder was intrahepatic and there was limited exposure.  Because of the thickness and the fact that the gallbladder was intrahepatic I needed to have my tactile feedback and better exposure.  Therefore I extended my incision to allow the placement of the GelPort.  GelPort was placed and I was able to place my hand and confirm that I was away from the common bile duct.  The infundibulum was grasped and retracted laterally, exposing the peritoneum overlying the triangle of Calot. This was then divided and exposed in a blunt fashion. An extended critical view of the cystic duct and cystic artery was obtained.  The cystic duct was clearly identified and bluntly dissected.   Artery and duct were double clipped and divided.  The gallbladder was taken from the gallbladder fossa in a retrograde  fashion with the electrocautery. He is now but the because of the intrahepatic nature of the gallbladder there was significant raw exposed area.  In one part of the body of the gallbladder actually left it  within the liver given the potential of proximity into a common bile duct structure.  I also was able to open the gallbladder and with my fingers guide the dissection to avoid any injuries to the common hepatic duct or to THE CBD. The liver shows significant role areas that were adequately controlled with electrocautery.   \The gallbladder was removed and placed in an Endocatch bag. The liver bed was irrigated and inspected. Hemostasis was achieved with the electrocautery. Copious irrigation was utilized and was repeatedly aspirated until clear.  The gallbladder and Endocatch sac were then removed through a port site.    Inspection of the right upper quadrant was performed. No bleeding, bile duct injury or leak, or bowel injury was noted. Pneumoperitoneum was released. 86 Blake drain was placed  In the GB fossa and secured using a 3-0 nylon   The periumbilical port site was closed with a running 0 PDS suture. 4-0 subcuticular Monocryl was used to close the skin. Dermabond was  applied.  The patient was then extubated and brought to the recovery room in stable condition. Sponge, lap, and needle counts were correct at closure and at the conclusion of the case.               Caroleen Hamman, MD, FACS

## 2018-01-25 NOTE — Progress Notes (Signed)
Rectal temp of pt. is 97.6

## 2018-01-25 NOTE — Anesthesia Preprocedure Evaluation (Signed)
Anesthesia Evaluation  Patient identified by MRN, date of birth, ID band Patient awake    History of Anesthesia Complications Negative for: history of anesthetic complications  Airway Mallampati: III       Dental   Pulmonary neg sleep apnea, neg COPD,           Cardiovascular hypertension, Pt. on medications + Past MI and + CABG  (-) CHF + dysrhythmias Atrial Fibrillation      Neuro/Psych neg Seizures Depression TIA (found on "scan")   GI/Hepatic Neg liver ROS, hiatal hernia,   Endo/Other  neg diabetes  Renal/GU negative Renal ROS     Musculoskeletal   Abdominal   Peds  Hematology   Anesthesia Other Findings   Reproductive/Obstetrics                             Anesthesia Physical Anesthesia Plan  ASA: III  Anesthesia Plan: General   Post-op Pain Management:    Induction: Intravenous  PONV Risk Score and Plan: 2 and Dexamethasone and Ondansetron  Airway Management Planned: Oral ETT  Additional Equipment:   Intra-op Plan:   Post-operative Plan:   Informed Consent: I have reviewed the patients History and Physical, chart, labs and discussed the procedure including the risks, benefits and alternatives for the proposed anesthesia with the patient or authorized representative who has indicated his/her understanding and acceptance.     Plan Discussed with:   Anesthesia Plan Comments:         Anesthesia Quick Evaluation
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
## 2018-01-25 NOTE — Interval H&P Note (Signed)
History and Physical Interval Note:  01/25/2018 8:49 AM  Charles Cook  has presented today for surgery, with the diagnosis of acute cholecystitis  The various methods of treatment have been discussed with the patient and family. After consideration of risks, benefits and other options for treatment, the patient has consented to  Procedure(s): LAPAROSCOPIC CHOLECYSTECTOMY WITH INTRAOPERATIVE CHOLANGIOGRAM (N/A) as a surgical intervention .  The patient's history has been reviewed, patient examined, no change in status, stable for surgery.  I have reviewed the patient's chart and labs.  Questions were answered to the patient's satisfaction.     [place]

## 2018-01-25 NOTE — Progress Notes (Signed)
   01/25/18 1900  Clinical Encounter Type  Visited With Family;Patient not available  Visit Type Initial;Code  Referral From Nurse  Spiritual Encounters  Spiritual Needs Emotional  CH reported for Code Blue; Briefly met with family to offer support; [place] available if needed for additional support
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
## 2018-01-25 NOTE — Progress Notes (Signed)
I was called in the room by family member due to pt wanting to sit up in the chair around 18:55. Therefore, pt was transferred to chair. Pt became unresponsive and pulse could not be palpated. So called Blue was called. Pt was transferred back to bed and became responsive again. So, CPR was not initiated. ED doc and code blue team in the room. Order received to transfer pt to ICU 15. Report given to Sarah. Hospitalist was consulted stat as well.

## 2018-01-25 NOTE — Anesthesia Procedure Notes (Signed)
Procedure Name: Intubation Date/Time: 01/25/2018 9:26 AM Performed by: Philbert Riser, CRNA Pre-anesthesia Checklist: Patient identified, Emergency Drugs available, Suction available, Patient being monitored and Timeout performed Patient Re-evaluated:Patient Re-evaluated prior to induction Oxygen Delivery Method: Circle system utilized and Simple face mask Preoxygenation: Pre-oxygenation with 100% oxygen Induction Type: IV induction Ventilation: Mask ventilation without difficulty Laryngoscope Size: Mac and 3 Grade View: Grade III Tube type: Oral Tube size: 7.5 mm Number of attempts: 1 Airway Equipment and Method: Stylet Placement Confirmation: ETT inserted through vocal cords under direct vision,  positive ETCO2 and breath sounds checked- equal and bilateral Secured at: 21 cm Tube secured with: Tape Dental Injury: Teeth and Oropharynx as per pre-operative assessment

## 2018-01-25 NOTE — Progress Notes (Signed)
Pt seen and examined. Syncopal episode while I was in the OR. He regained consciousness. HE did receive his lopressor recently. JP 150cc for the whole day. I emptied 90 minutes ago and there is about 10cc since then ( serosanguinous) He did drop his hb 1.4 gm. Currently he is conversant, BP is ok, hr ok Abd: soft, appropriate incisional tenderness, no peritonitis. Serosanguinous JP, did not fill whole I have been at his bedside.  A/ p Syncopal episode multifactorial including anemia, lopressor and major surgery We will trend his Hb I do not consider that surgical intervention is needed at this time but we will continue to monitor D/W pt and family in detail as well as the ICU team We will hold off on heparin products

## 2018-01-25 NOTE — Anesthesia Postprocedure Evaluation (Signed)
Anesthesia Post Note  Patient: Charles Cook  Procedure(s) Performed: LAPAROSCOPIC CHOLECYSTECTOMY WITH INTRAOPERATIVE CHOLANGIOGRAM (N/A )  Patient location during evaluation: PACU Anesthesia Type: General Level of consciousness: awake and alert Pain management: pain level controlled Vital Signs Assessment: post-procedure vital signs reviewed and stable Respiratory status: spontaneous breathing and respiratory function stable Cardiovascular status: stable Anesthetic complications: no     Last Vitals:  Vitals:   01/25/18 1205 01/25/18 1210  BP: (!) 153/63   Pulse: 60 (!) 58  Resp: 17 (!) 21  Temp:    SpO2: 100% 100%    Last Pain:  Vitals:   01/25/18 1155  TempSrc:   PainSc: 0-No pain                 Charles Cook

## 2018-01-25 NOTE — Anesthesia Post-op Follow-up Note (Signed)
Anesthesia QCDR form completed.        

## 2018-01-25 NOTE — Progress Notes (Signed)
Pt seen and examined . He Had another episode of hypotension that responded to crystalloids. HE now is oozing outside his jp drain and his dressing was saturated. NO much coming out of the JP. I had extensive discussion with the pt and family. I do think that the safest next thing to do is to explore him.  His HD are adequate for now but I think it is safer to take him now rather than when he is unstable. Discussed with the family in detail we will proceed with Exp. Laparotomy. Type and cross ordered

## 2018-01-25 NOTE — Consult Note (Addendum)
Name: Charles Cook MRN: [id number] DOB: 1929-09-12    ADMISSION DATE:  01/25/2018 CONSULTATION DATE:  01/25/2018  REFERRING MD :  Dr. Darvin Neighbours  CHIEF COMPLAINT:  Unresponsiveness  BRIEF PATIENT DESCRIPTION:  84 y.o. Male who underwent a Laparoscopic Cholecystectomy on 01/25/18.  Later in the evening pt became unresponsive briefly while getting up into his chair, nursing staff reported they were unable to palpate a pulse, and Code BLUE called.  Pt was moved from chair to bed and regained consciousness spontaneously, therefore CPR never had to be initiated.  Pt with syncopal episode likely in setting of orthostatic hypotension vs vagal episode.  SIGNIFICANT EVENTS  01/25/18>> Admitted after undergoing Laparoscopic Cholecystomy 01/25/18>> Pt briefly unresponsive, transferred to ICU  STUDIES:  01/25/18 Echocardiogram>>   HISTORY OF PRESENT ILLNESS:   Charles Cook is a 84 y.o. Male with a PMH of A-fib, CAD, MI s/p CABG, Cardiomyopathy, HTN, HLD, prostrate and bladder cancer, Hiatal hernia, MRSA infection, and Depression is who was admitted to [facility] unit on 01/25/18 following an elective Laparoscopic Cholecystectomy in the setting of Cholecystitis. Post procedure, pt had received his scheduled metoprolol.  Pt BP noted to be 108/42 at 1600.  Pt was having abdominal pain and was up to chair multiple times to help him tolerate the pain.  Around 1900 when pt was getting up to chair, he became unresponsive, and CODE Blue was called.  Nursing staff reports they were unable to palpate a pulse.  However, when they transitioned him from the chair to his bed, the pt regained consciousness, and CPR never had to be initiated.  He was subsequently transferred to ICU post event, and PCCM is consulted for further management.  Upon arrival to ICU pt is awake, alert and oriented, no focal neuro deficits.  He denies chest pain, shortness of breath, or palpations.   He does report dizziness and abdominal pain.  His SBP  is currently in the low 100's.   PAST MEDICAL HISTORY :   has a past medical history of Atrial fibrillation ([place]), Bladder cancer ([place]), BPH (benign prostatic hyperplasia), CAD (coronary artery disease), Cardiomyopathy ([place]), Depression, Hiatal hernia, MRSA infection, Hyperlipidemia, Hypertension, Myocardial infarction [facility]), and Prostate cancer ([place]).  has a past surgical history that includes Coronary artery bypass graft; Tonsillectomy and adenoidectomy (1930); Finger surgery; Colonoscopy (2014); and cataracts (Bilateral). Prior to Admission medications   Medication Sig Start Date End Date Taking? Authorizing Provider  acetaminophen (TYLENOL) 500 MG tablet Take 1,000 mg by mouth daily as needed for moderate pain or headache.   Yes [provider]  amiodarone (PACERONE) 200 MG tablet Take 200 mg by mouth daily.    Yes [provider]  apixaban (ELIQUIS) 2.5 MG TABS tablet Take 1 tablet (2.5 mg total) by mouth 2 (two) times daily. 10/09/15  Yes Gladstone Lighter, MD  aspirin EC 81 MG tablet Take 81 mg by mouth at bedtime.   Yes [provider]  Calcium Carbonate-Vitamin D (CALCIUM-D) 600-400 MG-UNIT TABS Take 1 tablet by mouth 2 (two) times daily.    Yes [provider]  Cyanocobalamin (VITAMIN B-12) 5000 MCG SUBL Place 5,000 mcg under the tongue daily.   Yes [provider]  digoxin (DIGOX) 0.25 MG tablet Take 0.5 tablets (0.125 mg total) by mouth daily. Patient taking differently: Take 0.25 mg by mouth daily.  08/07/17  Yes Mar Daring, PA-C  diphenhydrAMINE (BENADRYL) 25 MG tablet Take 25 mg by mouth at bedtime as needed for allergies.  Yes [provider]  fluticasone (FLONASE) 50 MCG/ACT nasal spray USE 2 SPRAYS IN EACH NOSTRIL DAILY Patient taking differently: Place 2 sprays into both nostrils daily as needed for allergies.  11/09/17  Yes Mar Daring, PA-C  metoprolol succinate (TOPROL-XL) 25 MG 24 hr tablet Take 25 mg by  mouth every evening.  12/01/16  Yes [provider]  Multiple Vitamin (MULTIVITAMIN WITH MINERALS) TABS tablet Take 1 tablet by mouth daily.   Yes [provider]  niacin (NIASPAN) 500 MG CR tablet TAKE ONE TABLET BY MOUTH ONCE DAILY 08/11/16  Yes Chrismon, Vickki Muff, PA  polyethylene glycol (MIRALAX / GLYCOLAX) packet Take 17 g by mouth daily as needed for mild constipation.   Yes [provider]  sertraline (ZOLOFT) 50 MG tablet Take 50 mg by mouth at bedtime.  12/13/17  Yes [provider]  traZODone (DESYREL) 100 MG tablet TAKE ONE TABLET BY MOUTH AT BEDTIME 10/13/17  Yes Burnette, Clearnce Sorrel, PA-C  denosumab (PROLIA) 60 MG/ML SOSY injection Inject 60 mg into the skin every 6 (six) months.    [provider]  Leuprolide Acetate, 6 Month, (LUPRON DEPOT, 98-MONTH, IM) Inject into the muscle every 6 (six) months.    [provider]   Allergies  Allergen Reactions  . Moxifloxacin Hcl In Nacl Other (See Comments)    Confusion     FAMILY HISTORY:  family history includes Cancer in his brother; Cancer - Other in his sister; Diabetes in his brother; Heart disease in his father. SOCIAL HISTORY:  reports that he has never smoked. He has never used smokeless tobacco. He reports that he drinks alcohol. He reports that he does not use drugs.  REVIEW OF SYSTEMS:  Positives in BOLD Constitutional: Negative for fever, chills, weight loss, malaise/fatigue and diaphoresis.  HENT: Negative for hearing loss, ear pain, nosebleeds, congestion, sore throat, neck pain, tinnitus and ear discharge.   Eyes: Negative for blurred vision, double vision, photophobia, pain, discharge and redness.  Respiratory: Negative for cough, hemoptysis, sputum production, shortness of breath, wheezing and stridor.   Cardiovascular: Negative for chest pain, palpitations, orthopnea, claudication, leg swelling and PND.  Gastrointestinal: Negative for heartburn, nausea, vomiting,  +abdominal pain, diarrhea, constipation, blood in stool and melena.  Genitourinary: Negative for dysuria, urgency, frequency, hematuria and flank pain.  Musculoskeletal: Negative for myalgias, back pain, joint pain and falls.  Skin: Negative for itching and rash.  Neurological: Negative for +dizziness, tingling, tremors, sensory change, speech change, focal weakness, seizures, loss of consciousness, weakness and headaches.  Endo/Heme/Allergies: Negative for environmental allergies and polydipsia. Does not bruise/bleed easily.  SUBJECTIVE:  Pt denies SOB, chest pain, or palpitations Reports dizziness and abdominal pain On 2L [place]  VITAL SIGNS: Temp:  [92.6 F (33.7 C)-98.3 F (36.8 C)] 98.3 F (36.8 C) (09/26 1804) Pulse Rate:  [49-85] 85 (09/26 1903) Resp:  [13-26] 24 (09/26 1804) BP: (85-182)/(42-79) 102/51 (09/26 1903) SpO2:  [88 %-100 %] 96 % (09/26 1903) Weight:  [80.7 kg] 80.7 kg (09/26 0756)  PHYSICAL EXAMINATION: General:  Acutely ill appearing male, laying in bed, on 2L [place], in NAD Neuro:  Awake, oriented to person place and time.  Follows commands, no focal deficits.  Pupils PERRLA 2 mm bilaterally HEENT:  Atraumatic, normocephalic, no JVD, neck supple Cardiovascular:  RRR, s1s2, no M/R/G Lungs:  Clear bilaterally, even, nonlabored, normal effort Abdomen:  Soft, nontender, slightly distended, BS+ x4 Musculoskeletal:  No deformities, normal bulk and tone Skin:  Midline Abdominal incision  clean dry and intact, JP drain to right abdomen  Recent Labs  Lab 01/25/18 1452  CREATININE 1.10   Recent Labs  Lab 01/25/18 1452  HGB 10.7*  HCT 31.9*  WBC 18.7*  PLT 250   Dg Cholangiogram Operative  Result Date: 01/25/2018 CLINICAL DATA:  84 year old male undergoing cholangiogram through existing catheter. EXAM: INTRAOPERATIVE CHOLANGIOGRAM TECHNIQUE: Cholangiographic images from the C-arm fluoroscopic device were submitted for interpretation post-operatively. Please see the  procedural report for the amount of contrast and the fluoroscopy time utilized. COMPARISON:  None. FINDINGS: Contrast injection was performed with fluoroscopic spot imaging. The images demonstrate a percutaneous cholecystostomy tube within the gallbladder. Filling defects are consistent with cholelithiasis. There is partial opacification of the common bile duct but no filling of the duodenum. IMPRESSION: 1. Cholelithiasis. 2. Partial visualization of the common bile duct which does not appear dilated. No definite contrast seen within the duodenum to confirm patency of the ampulla. Electronically Signed   By: Jacqulynn Cadet M.D.   On: 01/25/2018 10:34    ASSESSMENT / PLAN:  A: Unresponsiveness, likely syncopal episode in setting orthostatic hypotension vs vagal episode Hypotension, likely hypovolemic  Lactic acidosis Hx: HTN, HLD, CAD, MI s/p CABG, A-fib P: -Received 1L fluid bolus upon transfer -Stat CBC, BMP, INR, APTT -EKG -Trend Troponin -Maintenance IVF: LR @40  ml/hr -Cardiac monitoring -May need blood transfusion pending CBC results -Obtain Echocardiogram -Trend Lactic acid -Will give additional 1L LR bolus -Hold antihypertensives -Monitor I&O's / urinary output -Follow BMP -Ensure adequate renal perfusion -Avoid nephrotoxic agents as able -Replace electrolytes as indicated   A: Acute Cholecystitis Elevated LFT's P: -S/p Laparoscopic Cholecystectomy -Surgery following, appreciate input, will follow recommendations -Incentive spirometry -Pain control, cautious with narcotics for now given hypotension, prn tylenol  -Trend LFT's  A: Anemia from acute blood loss P: -Monitor for s/sx of bleeding -Monitor output from JP drain and surgical site -Follow CBC -Will trend H&H q6h -SCD's for VTE prophylaxis -Transfuse for Hgb<7  A: Leukocytosis, likely in setting of recent surgery today P: -Monitor fever cure -Trend WBC -Discussed with Dr. Dahlia Byes, he recommends  holding off on blood cultures, trending Procalcitonin, or initiating Zosyn for now  A: Hyperglycemia P: CBG's SSI Follow ICU Hypo/hyperglycemia protocol     Disposition: ICU Goals of care: Full code VTE Prophylaxis: SCD's Updates: Updated pt and his daughters at bedside 01/25/18.   Darel Hong, AGACNP-BC [facility] Pager: [phone]   01/25/2018, 7:32 PM

## 2018-01-25 NOTE — Transfer of Care (Signed)
Immediate Anesthesia Transfer of Care Note  Patient: Charles Cook  Procedure(s) Performed: LAPAROSCOPIC CHOLECYSTECTOMY WITH INTRAOPERATIVE CHOLANGIOGRAM (N/A )  Patient Location: PACU  Anesthesia Type:General  Level of Consciousness: sedated  Airway & Oxygen Therapy: Patient Spontanous Breathing and Patient connected to face mask oxygen  Post-op Assessment: Report given to RN and Post -op Vital signs reviewed and stable  Post vital signs: Reviewed and stable  Last Vitals:  Vitals Value Taken Time  BP    Temp    Pulse 62 01/25/2018 11:53 AM  Resp 23 01/25/2018 11:53 AM  SpO2 100 % 01/25/2018 11:53 AM  Vitals shown include unvalidated device data.  Last Pain:  Vitals:   01/25/18 0756  TempSrc: Oral  PainSc: 0-No pain         Complications: No apparent anesthesia complications

## 2018-01-26 ENCOUNTER — Encounter: Payer: Self-pay | Admitting: Anesthesiology

## 2018-01-26 ENCOUNTER — Inpatient Hospital Stay (HOSPITAL_COMMUNITY)
Admission: RE | Admit: 2018-01-26 | Discharge: 2018-01-26 | Disposition: A | Discharge status: Home / Self Care | Payer: Medicare Other | Source: Ambulatory Visit | Attending: Surgery | Admitting: Surgery

## 2018-01-26 ENCOUNTER — Encounter
Admission: RE | Disposition: A | Discharge status: Skilled Nursing Facility | Payer: Self-pay | Source: Home / Self Care | Attending: Surgery

## 2018-01-26 ENCOUNTER — Inpatient Hospital Stay: Payer: Medicare Other | Admitting: Certified Registered"

## 2018-01-26 DIAGNOSIS — D62 Acute posthemorrhagic anemia: Secondary | ICD-10-CM

## 2018-01-26 DIAGNOSIS — I340 Nonrheumatic mitral (valve) insufficiency: Secondary | ICD-10-CM

## 2018-01-26 LAB — LACTIC ACID, PLASMA
Lactic Acid, Venous: 2.1 mmol/L (ref 0.5–1.9)
Lactic Acid, Venous: 3 mmol/L (ref 0.5–1.9)
Lactic Acid, Venous: 3.4 mmol/L (ref 0.5–1.9)
Lactic Acid, Venous: 5.2 mmol/L (ref 0.5–1.9)

## 2018-01-26 LAB — CBC
HCT: 24.3 % — ABNORMAL LOW (ref 40.0–52.0)
HCT: 22.7 % — ABNORMAL LOW (ref 40.0–52.0)
HCT: 24.2 % — ABNORMAL LOW (ref 40.0–52.0)
Hemoglobin: 8.2 g/dL — ABNORMAL LOW (ref 13.0–18.0)
Hemoglobin: 7.8 g/dL — ABNORMAL LOW (ref 13.0–18.0)
Hemoglobin: 8.5 g/dL — ABNORMAL LOW (ref 13.0–18.0)
MCH: 33.5 pg (ref 26.0–34.0)
MCH: 33.8 pg (ref 26.0–34.0)
MCH: 31.8 pg (ref 26.0–34.0)
MCHC: 34 g/dL (ref 32.0–36.0)
MCHC: 34.3 g/dL (ref 32.0–36.0)
MCHC: 35.1 g/dL (ref 32.0–36.0)
MCV: 98.7 fL (ref 80.0–100.0)
MCV: 98.5 fL (ref 80.0–100.0)
MCV: 90.6 fL (ref 80.0–100.0)
Platelets: 211 10*3/uL (ref 150–440)
Platelets: 178 10*3/uL (ref 150–440)
Platelets: 158 10*3/uL (ref 150–440)
RBC: 2.46 MIL/uL — ABNORMAL LOW (ref 4.40–5.90)
RBC: 2.31 MIL/uL — ABNORMAL LOW (ref 4.40–5.90)
RBC: 2.67 MIL/uL — ABNORMAL LOW (ref 4.40–5.90)
RDW: 14 % (ref 11.5–14.5)
RDW: 13.9 % (ref 11.5–14.5)
RDW: 23 % — ABNORMAL HIGH (ref 11.5–14.5)
WBC: 22.8 10*3/uL — ABNORMAL HIGH (ref 3.8–10.6)
WBC: 16 10*3/uL — ABNORMAL HIGH (ref 3.8–10.6)
WBC: 13.5 10*3/uL — ABNORMAL HIGH (ref 3.8–10.6)

## 2018-01-26 LAB — COMPREHENSIVE METABOLIC PANEL
ALT: 107 U/L — ABNORMAL HIGH (ref 0–44)
AST: 170 U/L — ABNORMAL HIGH (ref 15–41)
Albumin: 3.3 g/dL — ABNORMAL LOW (ref 3.5–5.0)
Alkaline Phosphatase: 53 U/L (ref 38–126)
Anion gap: 9 (ref 5–15)
BUN: 21 mg/dL (ref 8–23)
CO2: 25 mmol/L (ref 22–32)
Calcium: 7.9 mg/dL — ABNORMAL LOW (ref 8.9–10.3)
Chloride: 99 mmol/L (ref 98–111)
Creatinine, Ser: 1.23 mg/dL (ref 0.61–1.24)
GFR calc Af Amer: 59 mL/min — ABNORMAL LOW (ref >60)
GFR calc non Af Amer: 51 mL/min — ABNORMAL LOW (ref >60)
Glucose, Bld: 176 mg/dL — ABNORMAL HIGH (ref 70–99)
Potassium: 5.1 mmol/L (ref 3.5–5.1)
Sodium: 133 mmol/L — ABNORMAL LOW (ref 135–145)
Total Bilirubin: 0.9 mg/dL (ref 0.3–1.2)
Total Protein: 5.6 g/dL — ABNORMAL LOW (ref 6.5–8.1)

## 2018-01-26 LAB — GLUCOSE, CAPILLARY
Glucose-Capillary: 182 mg/dL — ABNORMAL HIGH (ref 70–99)
Glucose-Capillary: 158 mg/dL — ABNORMAL HIGH (ref 70–99)
Glucose-Capillary: 142 mg/dL — ABNORMAL HIGH (ref 70–99)
Glucose-Capillary: 95 mg/dL (ref 70–99)
Glucose-Capillary: 102 mg/dL — ABNORMAL HIGH (ref 70–99)
Glucose-Capillary: 118 mg/dL — ABNORMAL HIGH (ref 70–99)

## 2018-01-26 LAB — ECHOCARDIOGRAM COMPLETE
Height: 72 in
Weight: 2847.99 oz

## 2018-01-26 LAB — HEMOGLOBIN AND HEMATOCRIT, BLOOD
HCT: 20.7 % — ABNORMAL LOW (ref 40.0–52.0)
Hemoglobin: 7.1 g/dL — ABNORMAL LOW (ref 13.0–18.0)

## 2018-01-26 LAB — ABO/RH: ABO/RH(D): A POS

## 2018-01-26 LAB — PREPARE RBC (CROSSMATCH)

## 2018-01-26 LAB — SURGICAL PATHOLOGY

## 2018-01-26 LAB — TROPONIN I: Troponin I: 0.03 ng/mL (ref <0.03)

## 2018-01-26 SURGERY — LAPAROTOMY, EXPLORATORY
Anesthesia: Choice

## 2018-01-26 MED ORDER — SODIUM CHLORIDE 0.9% IV SOLUTION
Freq: Once | INTRAVENOUS | Status: AC
  Administered 2018-01-26: 17:00:00 via INTRAVENOUS

## 2018-01-26 MED ORDER — PROPOFOL 10 MG/ML IV BOLUS
INTRAVENOUS | Status: DC | PRN
  Administered 2018-01-26: 120 mg via INTRAVENOUS

## 2018-01-26 MED ORDER — MINERAL OIL LIGHT 100 % EX OIL
TOPICAL_OIL | CUTANEOUS | Status: AC
  Filled 2018-01-26: qty 25

## 2018-01-26 MED ORDER — ONDANSETRON HCL 4 MG/2ML IJ SOLN: 4.0000 mg | Freq: Once | INTRAMUSCULAR | Status: DC | PRN

## 2018-01-26 MED ORDER — PIPERACILLIN-TAZOBACTAM 3.375 G IVPB
3.3750 g | Freq: Three times a day (TID) | INTRAVENOUS | Status: AC
  Administered 2018-01-26 – 2018-01-30 (×15): 3.375 g via INTRAVENOUS
  Filled 2018-01-26 (×15): qty 50

## 2018-01-26 MED ORDER — SUGAMMADEX SODIUM 200 MG/2ML IV SOLN
INTRAVENOUS | Status: DC | PRN
  Administered 2018-01-26: 200 mg via INTRAVENOUS

## 2018-01-26 MED ORDER — ALBUMIN HUMAN 5 % IV SOLN
INTRAVENOUS | Status: AC
  Filled 2018-01-26: qty 250

## 2018-01-26 MED ORDER — VASOPRESSIN 20 UNIT/ML IV SOLN
INTRAVENOUS | Status: DC | PRN
  Administered 2018-01-26: 2 [IU] via INTRAVENOUS

## 2018-01-26 MED ORDER — ORAL CARE MOUTH RINSE
15.0000 mL | Freq: Two times a day (BID) | OROMUCOSAL | Status: DC
  Administered 2018-01-26 – 2018-01-31 (×8): 15 mL via OROMUCOSAL

## 2018-01-26 MED ORDER — KETAMINE HCL 50 MG/ML IJ SOLN
INTRAMUSCULAR | Status: DC | PRN
  Administered 2018-01-26: 50 mg via INTRAVENOUS

## 2018-01-26 MED ORDER — ALBUMIN HUMAN 5 % IV SOLN
INTRAVENOUS | Status: DC | PRN
  Administered 2018-01-26 (×2): via INTRAVENOUS

## 2018-01-26 MED ORDER — SUGAMMADEX SODIUM 200 MG/2ML IV SOLN
INTRAVENOUS | Status: AC
  Filled 2018-01-26: qty 2

## 2018-01-26 MED ORDER — FENTANYL CITRATE (PF) 100 MCG/2ML IJ SOLN
INTRAMUSCULAR | Status: DC | PRN
  Administered 2018-01-26 (×2): 50 ug via INTRAVENOUS

## 2018-01-26 MED ORDER — EVICEL 2 ML EX KIT
PACK | CUTANEOUS | Status: DC | PRN
  Administered 2018-01-26: 1

## 2018-01-26 MED ORDER — VANCOMYCIN HCL IN DEXTROSE 1-5 GM/200ML-% IV SOLN
1000.0000 mg | Freq: Once | INTRAVENOUS | Status: AC
  Administered 2018-01-26: 1000 mg via INTRAVENOUS
  Filled 2018-01-26: qty 200

## 2018-01-26 MED ORDER — LIDOCAINE HCL (CARDIAC) PF 100 MG/5ML IV SOSY
PREFILLED_SYRINGE | INTRAVENOUS | Status: DC | PRN
  Administered 2018-01-26: 25 mg via INTRAVENOUS

## 2018-01-26 MED ORDER — VANCOMYCIN HCL IN DEXTROSE 750-5 MG/150ML-% IV SOLN
750.0000 mg | Freq: Two times a day (BID) | INTRAVENOUS | Status: DC
  Filled 2018-01-26 (×2): qty 150

## 2018-01-26 MED ORDER — SUCCINYLCHOLINE CHLORIDE 20 MG/ML IJ SOLN
INTRAMUSCULAR | Status: DC | PRN
  Administered 2018-01-26: 100 mg via INTRAVENOUS

## 2018-01-26 MED ORDER — PHENYLEPHRINE HCL 10 MG/ML IJ SOLN
INTRAMUSCULAR | Status: DC | PRN
  Administered 2018-01-26: 100 ug via INTRAVENOUS

## 2018-01-26 MED ORDER — EPHEDRINE SULFATE 50 MG/ML IJ SOLN
INTRAMUSCULAR | Status: DC | PRN
  Administered 2018-01-26 (×2): 10 mg via INTRAVENOUS

## 2018-01-26 MED ORDER — FENTANYL CITRATE (PF) 100 MCG/2ML IJ SOLN: 25.0000 ug | INTRAMUSCULAR | Status: DC | PRN

## 2018-01-26 MED ORDER — ROCURONIUM BROMIDE 100 MG/10ML IV SOLN
INTRAVENOUS | Status: DC | PRN
  Administered 2018-01-26: 35 mg via INTRAVENOUS
  Administered 2018-01-26: 55 mg via INTRAVENOUS

## 2018-01-26 SURGICAL SUPPLY — 44 items
APPLIER CLIP 11 MED OPEN (CLIP) ×3
APPLIER CLIP 13 LRG OPEN (CLIP) ×3
BARRIER ADH SEPRAFILM 3INX5IN (MISCELLANEOUS) IMPLANT
BLADE CLIPPER SURG (BLADE) ×3 IMPLANT
BLADE SURG SZ10 CARB STEEL (BLADE) ×3 IMPLANT
CANISTER SUCT 3000ML PPV (MISCELLANEOUS) ×3 IMPLANT
CHLORAPREP W/TINT 26ML (MISCELLANEOUS) ×3 IMPLANT
CLIP APPLIE 11 MED OPEN (CLIP) ×1 IMPLANT
CLIP APPLIE 13 LRG OPEN (CLIP) ×1 IMPLANT
DRAPE LAPAROTOMY 100X77 ABD (DRAPES) ×3 IMPLANT
DRAPE TABLE BACK 80X90 (DRAPES) ×3 IMPLANT
DRSG TEGADERM 2-3/8X2-3/4 SM (GAUZE/BANDAGES/DRESSINGS) ×6 IMPLANT
DRSG TELFA 3X8 NADH (GAUZE/BANDAGES/DRESSINGS) ×3 IMPLANT
ELECT BLADE 6.5 EXT (BLADE) ×3 IMPLANT
ELECT REM PT RETURN 9FT ADLT (ELECTROSURGICAL) ×3
ELECTRODE REM PT RTRN 9FT ADLT (ELECTROSURGICAL) ×1 IMPLANT
GAUZE SPONGE 4X4 12PLY STRL (GAUZE/BANDAGES/DRESSINGS) ×6 IMPLANT
GLOVE BIO SURGEON STRL SZ7 (GLOVE) ×3 IMPLANT
GOWN STRL REUS W/ TWL LRG LVL3 (GOWN DISPOSABLE) ×2 IMPLANT
GOWN STRL REUS W/TWL LRG LVL3 (GOWN DISPOSABLE) ×4
HANDLE SUCTION POOLE (INSTRUMENTS) ×1 IMPLANT
HANDLE YANKAUER SUCT BULB TIP (MISCELLANEOUS) ×3 IMPLANT
LIGASURE IMPACT 36 18CM CVD LR (INSTRUMENTS) IMPLANT
NEEDLE HYPO 22GX1.5 SAFETY (NEEDLE) ×6 IMPLANT
NEEDLE HYPO 25X1 1.5 SAFETY (NEEDLE) ×3 IMPLANT
PACK BASIN MAJOR ARMC (MISCELLANEOUS) ×3 IMPLANT
RELOAD PROXIMATE 75MM BLUE (ENDOMECHANICALS) IMPLANT
SPONGE LAP 18X18 RF (DISPOSABLE) ×3 IMPLANT
SPONGE LAP 18X36 RFD (DISPOSABLE) IMPLANT
STAPLER PROXIMATE 75MM BLUE (STAPLE) IMPLANT
STAPLER SKIN PROX 35W (STAPLE) ×3 IMPLANT
SUCTION POOLE HANDLE (INSTRUMENTS) ×3
SUT PDS AB 0 CT1 27 (SUTURE) ×9 IMPLANT
SUT SILK 2 0 (SUTURE) ×2
SUT SILK 2 0 SH CR/8 (SUTURE) ×3 IMPLANT
SUT SILK 2 0SH CR/8 30 (SUTURE) ×3 IMPLANT
SUT SILK 2-0 18XBRD TIE 12 (SUTURE) ×1 IMPLANT
SUT VIC AB 0 CT1 36 (SUTURE) ×6 IMPLANT
SUT VIC AB 2-0 SH 27 (SUTURE) ×4
SUT VIC AB 2-0 SH 27XBRD (SUTURE) ×2 IMPLANT
SYR 30ML LL (SYRINGE) ×6 IMPLANT
SYR 3ML LL SCALE MARK (SYRINGE) ×3 IMPLANT
TAPE MICROFOAM 4IN (TAPE) ×3 IMPLANT
TRAY FOLEY MTR SLVR 16FR STAT (SET/KITS/TRAYS/PACK) ×3 IMPLANT

## 2018-01-26 NOTE — OR Nursing (Signed)
md notified [id number] labs lactic acid 5.24. No new orders

## 2018-01-26 NOTE — Progress Notes (Signed)
Pharmacy Antibiotic Note  Charles Cook is a 84 y.o. male admitted on 01/25/2018 with intra-abdominal infx.  Pharmacy has been consulted for vanc/cefepime dosing. Patient received vanc 1g IV x 1  Plan: Will continue vanc 750 mg IV q12h w/ 6 hour stack  Will draw trough prior to 4th dose. Will continue zosyn 3.375g IV q8h  Ke 0.0424  T1/2 ~ 12 hrs Goal trough 15 - 20 mcg/mL  Height: 6' (182.9 cm) Weight: 178 lb (80.7 kg) IBW/kg (Calculated) : 77.6  Temp (24hrs), Avg:96.6 F (35.9 C), Min:92.6 F (33.7 C), Max:98.3 F (36.8 C)  Recent Labs  Lab 01/25/18 1452 01/25/18 1938 01/25/18 2051 01/25/18 2336 01/26/18 0043  WBC 18.7* 24.1*  --   --  22.8*  CREATININE 1.10 1.27*  --   --   --   LATICACIDVEN  --   --  5.3* 5.2*  --     Estimated Creatinine Clearance: 45.8 mL/min (A) (by C-G formula based on SCr of 1.27 mg/dL (H)).    Allergies  Allergen Reactions  . Moxifloxacin Hcl In Nacl Other (See Comments)    Confusion     Thank you for allowing pharmacy to be a part of this patient's care.  Tobie Lords, PharmD, BCPS Clinical Pharmacist 01/26/2018

## 2018-01-26 NOTE — Progress Notes (Signed)
*  PRELIMINARY RESULTS* Echocardiogram 2D Echocardiogram has been performed.  Charles Cook 01/26/2018, 9:12 AM

## 2018-01-26 NOTE — Progress Notes (Signed)
Pt more awake and alert now, oriented to x4, denies any pain since coming back from surgery.

## 2018-01-26 NOTE — Anesthesia Procedure Notes (Signed)
Procedure Name: Intubation Performed by: Rolla Plate, CRNA Pre-anesthesia Checklist: Patient identified, Patient being monitored, Timeout performed, Emergency Drugs available and Suction available Patient Re-evaluated:Patient Re-evaluated prior to induction Oxygen Delivery Method: Circle system utilized Preoxygenation: Pre-oxygenation with 100% oxygen Induction Type: IV induction and Rapid sequence Laryngoscope Size: Mac and 3 Grade View: Grade I Tube type: Oral Tube size: 7.5 mm Number of attempts: 1 Airway Equipment and Method: Stylet Placement Confirmation: ETT inserted through vocal cords under direct vision,  positive ETCO2 and breath sounds checked- equal and bilateral Secured at: 22 cm Tube secured with: Tape Dental Injury: Teeth and Oropharynx as per pre-operative assessment

## 2018-01-26 NOTE — Transfer of Care (Signed)
Immediate Anesthesia Transfer of Care Note  Patient: Charles Cook  Procedure(s) Performed: EXPLORATORY LAPAROTOMY (N/A Abdomen)  Patient Location: PACU  Anesthesia Type:General  Level of Consciousness: awake  Airway & Oxygen Therapy: Patient Spontanous Breathing and Patient connected to face mask oxygen  Post-op Assessment: Report given to RN and Post -op Vital signs reviewed and stable  Post vital signs: Reviewed  Last Vitals:  Vitals Value Taken Time  BP 138/43 01/26/2018  1:52 AM  Temp    Pulse 66 01/26/2018  1:52 AM  Resp 13 01/26/2018  1:52 AM  SpO2 100 % 01/26/2018  1:52 AM  Vitals shown include unvalidated device data.  Last Pain:  Vitals:   01/25/18 2000  TempSrc:   PainSc: 2       Patients Stated Pain Goal: 0 (73/75/05 1071)  Complications: No apparent anesthesia complications

## 2018-01-26 NOTE — Progress Notes (Signed)
Pt return from PACU after Exp lap, awake bout sleepy, VSS, pt reoriented to to room and call light.

## 2018-01-26 NOTE — Progress Notes (Signed)
CRITICAL VALUE ALERT  Critical Value:  Lactic acid 5.3  Date & Time Notied: 01/25/18 2125  Provider Notified: Jearld Adjutant NP  Orders Received/Actions taken: Order received. See order pag

## 2018-01-26 NOTE — Progress Notes (Signed)
Seen and examined Much improved Good UO Lactate improving Abd: soft, serous and biliary tinge drainage  A/P DOing well Presumed sepsis Continue A/BS May be developing slow bile leak, we will treat w drain [name] transfer to floor tomorrow

## 2018-01-26 NOTE — Progress Notes (Signed)
   01/26/18 1030  Clinical Encounter Type  Visited With Patient and family together  Visit Type Follow-up  [place] followed up with patient and daughter based on last night's on-call chaplain's report.  Patient shared stories of faith and testimony, how he appreciates the opportunity to tell others of Jesus Christ and the blessings of life.  Patient expressed peace with the idea of going to heaven; however, patient also hopes that he has years left to continue sharing his story and to see his grandchildren graduate from high school.  Chaplain prayed with patient and daughter, provided education regarding ongoing chaplain support and encouraged them to reach out as needed.

## 2018-01-26 NOTE — Progress Notes (Signed)
Pharmacy Antibiotic Note  Charles Cook is a 84 y.o. male admitted on 01/25/2018 to ICU s/p Code Blue. Patient had laparoscopic cholecystectomy on 9/26.  Pharmacy has been consulted for vancomycin and Zosyn dosing for possible intraabdominal pathogens.   Plan: Continue Zosyn EI 3.375g IV Q8hr.    Per am ICU rounds, vancomycin discontinued.   Height: 6' (182.9 cm) Weight: 178 lb (80.7 kg) IBW/kg (Calculated) : 77.6  Temp (24hrs), Avg:97.5 F (36.4 C), Min:96.5 F (35.8 C), Max:98.3 F (36.8 C)  Recent Labs  Lab 01/25/18 1452 01/25/18 1938 01/25/18 2051 01/25/18 2336 01/26/18 0043 01/26/18 0559 01/26/18 0622 01/26/18 0939 01/26/18 1203  WBC 18.7* 24.1*  --   --  22.8* 16.0*  --   --   --   CREATININE 1.10 1.27*  --   --   --  1.23  --   --   --   LATICACIDVEN  --   --  5.3* 5.2*  --   --  3.0* 3.4* 2.1*    Estimated Creatinine Clearance: 47.3 mL/min (by C-G formula based on SCr of 1.23 mg/dL).    Allergies  Allergen Reactions  . Moxifloxacin Hcl In Nacl Other (See Comments)    Confusion     Antimicrobials this admission: Cefazolin 9/26 >> 9/27 Vancomycin 9/27 x 1  Zosyn 9/27 >>   Dose adjustments this admission: N/A  Microbiology results: 9/27 BCx: pending  9/27 MRSA PCR: negative   Thank you for allowing pharmacy to be a part of this patient's care.  Fadil Macmaster L 01/26/2018 4:52 PM

## 2018-01-26 NOTE — Progress Notes (Signed)
Pt trransported to OR.

## 2018-01-26 NOTE — Progress Notes (Signed)
[place] [facility] [place] NAME: Charles Cook    MR#:  [id number]  DATE OF BIRTH:  Jan 27, 1930  SUBJECTIVE:   Patient had a syncopal event after laparoscopic cholecystectomy.  No further episodes of syncope overnight, hemodynamically stable, patient complaining of abdominal pain near his surgical site.  REVIEW OF SYSTEMS:    Review of Systems  Constitutional: Negative for chills and fever.  HENT: Negative for congestion and tinnitus.   Eyes: Negative for blurred vision and double vision.  Respiratory: Negative for cough, shortness of breath and wheezing.   Cardiovascular: Negative for chest pain, orthopnea and PND.  Gastrointestinal: Positive for abdominal pain. Negative for diarrhea, nausea and vomiting.  Genitourinary: Negative for dysuria and hematuria.  Neurological: Negative for dizziness, sensory change and focal weakness.  All other systems reviewed and are negative.   Nutrition: Clear liquids Tolerating Diet: Yes Tolerating PT: Await Eval.    DRUG ALLERGIES:   Allergies  Allergen Reactions  . Moxifloxacin Hcl In Nacl Other (See Comments)    Confusion     VITALS:  Blood pressure 123/65, pulse 75, temperature 97.9 F (36.6 C), temperature source Oral, resp. rate (!) 28, height 6' (1.829 m), weight 80.7 kg, SpO2 95 %.  PHYSICAL EXAMINATION:   Physical Exam  GENERAL:  84 y.o.-year-old patient lying in bed in no acute distress.  EYES: Pupils equal, round, reactive to light and accommodation. No scleral icterus. Extraocular muscles intact.  HEENT: Head atraumatic, normocephalic. Oropharynx and nasopharynx clear.  NECK:  Supple, no jugular venous distention. No thyroid enlargement, no tenderness.  LUNGS: Normal breath sounds bilaterally, no wheezing, rales, rhonchi. No use of accessory muscles of respiration.  CARDIOVASCULAR: S1, S2 normal. No murmurs, rubs, or gallops.  ABDOMEN: Soft, Tender in the mid-abdomen near surgical site.   JP drain in place with serosanguineous fluid draining. nondistended. Bowel sounds hypoactive. No organomegaly or mass.  EXTREMITIES: No cyanosis, clubbing or edema b/l.    NEUROLOGIC: Cranial nerves II through XII are intact. No focal Motor or sensory deficits b/l. Globally weak  PSYCHIATRIC: The patient is alert and oriented x 3.  SKIN: No obvious rash, lesion, or ulcer.    LABORATORY PANEL:   CBC Recent Labs  Lab 01/26/18 0559 01/26/18 1203  WBC 16.0*  --   HGB 7.8* 7.1*  HCT 22.7* 20.7*  PLT 178  --    ------------------------------------------------------------------------------------------------------------------  Chemistries  Recent Labs  Lab 01/26/18 0559  NA 133*  K 5.1  CL 99  CO2 25  GLUCOSE 176*  BUN 21  CREATININE 1.23  CALCIUM 7.9*  AST 170*  ALT 107*  ALKPHOS 53  BILITOT 0.9   ------------------------------------------------------------------------------------------------------------------  Cardiac Enzymes Recent Labs  Lab 01/26/18 0559  TROPONINI 0.03*   ------------------------------------------------------------------------------------------------------------------  RADIOLOGY:  Dg Cholangiogram Operative  Result Date: 01/25/2018 CLINICAL DATA:  84 year old male undergoing cholangiogram through existing catheter. EXAM: INTRAOPERATIVE CHOLANGIOGRAM TECHNIQUE: Cholangiographic images from the C-arm fluoroscopic device were submitted for interpretation post-operatively. Please see the procedural report for the amount of contrast and the fluoroscopy time utilized. COMPARISON:  None. FINDINGS: Contrast injection was performed with fluoroscopic spot imaging. The images demonstrate a percutaneous cholecystostomy tube within the gallbladder. Filling defects are consistent with cholelithiasis. There is partial opacification of the common bile duct but no filling of the duodenum. IMPRESSION: 1. Cholelithiasis. 2. Partial visualization of the common bile duct  which does not appear dilated. No definite contrast seen within the duodenum to confirm patency of the  ampulla. Electronically Signed   By: Jacqulynn Cadet M.D.   On: 01/25/2018 10:34     ASSESSMENT AND PLAN:   84 year old male with past medical history of prostate cancer, hypertension, hyperlipidemia, depression, coronary artery disease, BPH, atrial fibrillation, history of bladder cancer who was admitted to the hospital for a laparoscopic cholecystectomy and postoperatively was noted to have a syncopal event.  1.  Unresponsiveness/syncopal event-secondary to volume loss/orthostatic hypotension and blood loss from surgery. Hg. Down to 7.1 today.  Will transfuse. - No clinical evidence of sepsis.  No alarms on telemetry, no arrhythmias noted. - Improved with IV fluid hydration no further episodes of syncope.  2.  Acute cholecystitis status post laparoscopic cholecystectomy- continue further care as per general surgery. - Continue empiric Zosyn, placed on clear liquid diet.  Pain control as per surgery.  3.  History of atrial fibrillation-currently rate controlled.  Continue oral amiodarone, digoxin. - Eliquis is on hold due to recent surgery, and anemia.  4.  Essential hypertension- patient had a syncopal episode and was noted to be hypotensive.  Hold antihypertensives for now.  5. Anemia - likely acute blood loss anemia for recent surgery.  - ?? Need for transfusion as Hg. Has dropped to 7.1.  - notified nursing staff in ICU and may benefit from Transfusion.   6. Anxiety/Depression -patient can resume his Zoloft, trazodone after he is able to take p.o. well.     All the records are reviewed and case discussed with Care Management/Social Worker. Management plans discussed with the patient, family and they are in agreement.  CODE STATUS: Full code  DVT Prophylaxis: Ted's & SCD's.   TOTAL TIME TAKING CARE OF THIS PATIENT: 30 minutes.   POSSIBLE D/C IN 2-3 DAYS, DEPENDING ON  CLINICAL CONDITION.   Henreitta Leber M.D on 01/26/2018 at 4:03 PM  Between 7am to 6pm - Pager - [phone]  After 6pm go to [email or web address] - [employer] [facility]  Office  [phone]  CC: Primary care physician; Mar Daring, PA-C

## 2018-01-26 NOTE — Op Note (Signed)
Exploratory laparotomy  Pre-operative Diagnosis: post op bleeding  Post-operative Diagnosis: No active bleeding, Sepsis  Surgeon: Caroleen Hamman, MD FACS  Anesthesia: Gen. with endotracheal tube   Findings: Minimal Hemoperitoneum about 50-100cc serosanguinous fluid No evidence of active bleeding NO evidence of bile leak  Estimated Blood Loss: 50cc         Drains: 149 blake RUQ         Specimens: Gallbladder           Complications: none   Procedure Details  The patient was seen again in the Holding Room. The benefits, complications, treatment options, and expected outcomes were discussed with the patient. The risks of bleeding, infection, recurrence of symptoms, failure to resolve symptoms, bile duct damage, bile duct leak, retained common bile duct stone, bowel injury, any of which could require further surgery and/or ERCP, stent, or papillotomy were reviewed with the patient. The likelihood of improving the patient's symptoms with return to their baseline status is good.  The patient and/or family concurred with the proposed plan, giving informed consent.  The patient was taken to Operating Room, identified as [place] and the procedure verified.  A Time Out was held and the above information confirmed.  Prior to the induction of general anesthesia, antibiotic prophylaxis was administered. VTE prophylaxis was in place. General endotracheal anesthesia was then administered and tolerated well. After the induction, the abdomen was prepped with Chloraprep and draped in the sterile fashion. The patient was positioned in the supine position.   We remove the Dermabond from previous midline incision extended cephalad and perform a reopening of recent laparotomy.  Obtain adequate exposure and placed our retractors.  There was evidence of some serosanguineous fluid about 5200 cc in the right upper quadrant but it was unimpressive.  There was no evidence of active bleeding.  We checked the  anterior surface the liver as well as the gallbladder fossa and the clips there were only in place no evidence of any active bleeding.  There was a raw edge of the liver that we cauterized and place AdvaSeal.  The drain was exchanged to a larger drain with a #19 [place] Blake drain.  Left upper quadrant was inspected there was no evidence of any bleed.  There was no evidence of any bowel injury.  Abdomen was washed out.  The laparotomy was closed with 2 running 0 PDS in the standard fashion and the skin was closed with staples.   Sponge, lap, and needle counts were correct at closure and at the conclusion of the case.  Plan to treat as sepsis             Caroleen Hamman, MD, FACS

## 2018-01-26 NOTE — Progress Notes (Signed)
[facility] Progress Note  1 Day Post-Op  Subjective: Overnight, patient had a syncopal episode around 7 pm yesterday in which a code was called. No PR initiated as patient became responsive quickly. He was transferred to the ICU and monitored for acute bleeding. Around 10 pm last night, he became hypotensive again and the decision was made to return to the OR for ex[plartory laparotomy which did note reveal any significant hemorrhage, and he was transferred back to the ICU.  This morning, he is alert and responsive in bed with daughter at bedside. He is very curious as to the events overnight as he has no recollection of them. He notes soreness in his abdomen but denied any nausea or emesis. He has not tried to eat this morning. No flatus. Vitals have stabilized this morning, on IVF, not requiring pressors.   Objective: Vital signs in last 24 hours: Temp:  [92.6 F (33.7 C)-98.3 F (36.8 C)] 97.8 F (36.6 C) (09/27 0900) Pulse Rate:  [56-85] 71 (09/27 0915) Resp:  [12-28] 28 (09/27 0900) BP: (75-177)/(32-97) 117/89 (09/27 0900) SpO2:  [88 %-100 %] 92 % (09/27 0900) FiO2 (%):  [3 %] 3 % (09/27 0330)    Intake/Output from previous day: 09/26 0701 - 09/27 0700 In: 3526.4 [I.V.:2961; IV Piggyback:565.4] Out: 1260 [Urine:300; Drains:210; Blood:350] Intake/Output this shift: No intake/output data recorded.  PE: Gen:  Alert, NAD, pleasant Card:  Regular rate and rhythm,  Pulm:  Normal effort, clear to auscultation bilaterally Abd: Soft, diffuse tenderness, non-distended, no rebound, rigidity, or guarding. Midline incision with honeycomb in place, no saturation, no signs of bleeding. JP in RUQ, no bleeding, appears to be bile tinged fluid in bulb.  Skin: warm and dry, no rashes  Psych: A&Ox3   Lab Results:  Recent Labs    01/26/18 0043 01/26/18 0559  WBC 22.8* 16.0*  HGB 8.2* 7.8*  HCT 24.3* 22.7*  PLT 211 178   BMET Recent Labs    01/25/18 1938  01/26/18 0559  NA 135 133*  K 5.1 5.1  CL 103 99  CO2 21* 25  GLUCOSE 222* 176*  BUN 17 21  CREATININE 1.27* 1.23  CALCIUM 8.1* 7.9*   PT/INR Recent Labs    01/25/18 1942  LABPROT 13.2  INR 1.01   CMP     Component Value Date/Time   NA 133 (L) 01/26/2018 0559   NA 136 11/21/2017 1145   K 5.1 01/26/2018 0559   CL 99 01/26/2018 0559   CO2 25 01/26/2018 0559   GLUCOSE 176 (H) 01/26/2018 0559   BUN 21 01/26/2018 0559   BUN 10 11/21/2017 1145   CREATININE 1.23 01/26/2018 0559   CALCIUM 7.9 (L) 01/26/2018 0559   PROT 5.6 (L) 01/26/2018 0559   PROT 5.4 (L) 11/21/2017 1145   ALBUMIN 3.3 (L) 01/26/2018 0559   ALBUMIN 3.5 11/21/2017 1145   AST 170 (H) 01/26/2018 0559   ALT 107 (H) 01/26/2018 0559   ALKPHOS 53 01/26/2018 0559   BILITOT 0.9 01/26/2018 0559   BILITOT 0.3 11/21/2017 1145   GFRNONAA 51 (L) 01/26/2018 0559   GFRAA 59 (L) 01/26/2018 0559   Lipase     Component Value Date/Time   LIPASE 23 11/12/2017 0855       Studies/Results: Dg Cholangiogram Operative  Result Date: 01/25/2018 CLINICAL DATA:  84 year old male undergoing cholangiogram through existing catheter. EXAM: INTRAOPERATIVE CHOLANGIOGRAM TECHNIQUE: Cholangiographic images from the C-arm fluoroscopic device were submitted for interpretation post-operatively. Please see the procedural report for  the amount of contrast and the fluoroscopy time utilized. COMPARISON:  None. FINDINGS: Contrast injection was performed with fluoroscopic spot imaging. The images demonstrate a percutaneous cholecystostomy tube within the gallbladder. Filling defects are consistent with cholelithiasis. There is partial opacification of the common bile duct but no filling of the duodenum. IMPRESSION: 1. Cholelithiasis. 2. Partial visualization of the common bile duct which does not appear dilated. No definite contrast seen within the duodenum to confirm patency of the ampulla. Electronically Signed   By: Jacqulynn Cadet M.D.   On:  01/25/2018 10:34    Anti-infectives: Anti-infectives (From admission, onward)   Start     Dose/Rate Route Frequency Ordered Stop   01/26/18 1000  vancomycin (VANCOCIN) IVPB 750 mg/150 ml premix     750 mg 150 mL/hr over 60 Minutes Intravenous Every 12 hours 01/26/18 0533     01/26/18 0600  piperacillin-tazobactam (ZOSYN) IVPB 3.375 g     3.375 g 12.5 mL/hr over 240 Minutes Intravenous Every 8 hours 01/26/18 0241     01/26/18 0245  vancomycin (VANCOCIN) IVPB 1000 mg/200 mL premix     1,000 mg 200 mL/hr over 60 Minutes Intravenous  Once 01/26/18 0241 01/26/18 0505   01/25/18 2345  ceFAZolin (ANCEF) IVPB 2g/100 mL premix     2 g 200 mL/hr over 30 Minutes Intravenous On call to O.R. 01/25/18 2336 01/26/18 0051   01/25/18 0803  ceFAZolin (ANCEF) 2-4 GM/100ML-% IVPB    Note to Pharmacy:  Phineas Real   : cabinet override      01/25/18 0803 01/25/18 2029   01/25/18 0600  ceFAZolin (ANCEF) IVPB 2g/100 mL premix     2 g 200 mL/hr over 30 Minutes Intravenous On call to O.R. 01/24/18 2207 01/25/18 0917       Assessment/Plan  Cholecystitis - POD1, require exploratory laparotomy last night to assess for hemorrhage. Appears to be improving from Issues overnight. VSS this morning, not requiring pressors, responding to IVF resuscitation. This event may be more related to bacteremia rather than hemorrhage, will continue to follow.  - Continue IV ABx - Hgb 7.8 this morning which continues to trend down, may be a dilutional effect rather than significant bleeding, continue to monitor - Lactic acidosis, continue IVF - Transaminitis, expected post-operative, should resolve - 210 ccs from JP drain this morning, bile tinged fluid, may have a bile leak. Will continue to monitor. If this persists for a few days may require ERCP.  - Critical care following, appreciate their help   LOS: 1 day    Edison Simon , PA-C [facility] 01/26/2018, 10:33 AM [phone] M-F: 7am -  4pm

## 2018-01-26 NOTE — Progress Notes (Signed)
Patient complained of sudden left-sided chest pain radiating to left upper abdomen. PRN pain medicine given. (see MAR) ICU MD notified in person. ICU MD acknowledged the information and gave no new orders at this time. Will continue to monitor.

## 2018-01-26 NOTE — Anesthesia Preprocedure Evaluation (Addendum)
Anesthesia Evaluation  Patient identified by MRN, date of birth, ID band Patient awake    History of Anesthesia Complications Negative for: history of anesthetic complications  Airway Mallampati: III       Dental  (+) Dental Advidsory Given   Pulmonary neg sleep apnea, neg COPD,           Cardiovascular hypertension, Pt. on medications + CAD, + Past MI and + CABG  (-) CHF + dysrhythmias Atrial Fibrillation      Neuro/Psych neg Seizures PSYCHIATRIC DISORDERS Depression TIA (found on "scan")   GI/Hepatic Neg liver ROS, hiatal hernia,   Endo/Other  neg diabetes  Renal/GU negative Renal ROS     Musculoskeletal   Abdominal   Peds  Hematology   Anesthesia Other Findings Patient is being brought back to the OR for suspicion of post-op hemorrhage.  Patient has had a rapid response on the floor, was coded, and is now in the ICU.  His abdomen is now distended as well.  Surgeon would like to perform an ex-lap to determine possible cause.  Reproductive/Obstetrics                             Anesthesia Physical  Anesthesia Plan  ASA: III and emergent  Anesthesia Plan: General   Post-op Pain Management:    Induction: Intravenous  PONV Risk Score and Plan: 2 and Dexamethasone and Ondansetron  Airway Management Planned: Oral ETT  Additional Equipment:   Intra-op Plan:   Post-operative Plan: Extubation in OR  Informed Consent: I have reviewed the patients History and Physical, chart, labs and discussed the procedure including the risks, benefits and alternatives for the proposed anesthesia with the patient or authorized representative who has indicated his/her understanding and acceptance.   Dental Advisory Given  Plan Discussed with: Anesthesiologist, CRNA and Surgeon  Anesthesia Plan Comments:        Anesthesia Quick Evaluation

## 2018-01-26 NOTE — Anesthesia Post-op Follow-up Note (Signed)
Anesthesia QCDR form completed.        

## 2018-01-26 NOTE — Anesthesia Postprocedure Evaluation (Signed)
Anesthesia Post Note  Patient: Charles Cook  Procedure(s) Performed: EXPLORATORY LAPAROTOMY (N/A Abdomen)  Patient location during evaluation: ICU Anesthesia Type: General Level of consciousness: awake and alert Pain management: pain level controlled Vital Signs Assessment: post-procedure vital signs reviewed and stable Respiratory status: spontaneous breathing Cardiovascular status: stable Anesthetic complications: no     Last Vitals:  Vitals:   01/26/18 0535 01/26/18 0600  BP: (!) 148/58 139/63  Pulse: 71 71  Resp: 17 19  Temp:    SpO2: 97% 98%    Last Pain:  Vitals:   01/26/18 0535  TempSrc:   PainSc: 0-No pain                 Lanora Manis

## 2018-01-26 NOTE — Progress Notes (Signed)
Name: Charles Cook MRN: [id number] DOB: 20-Feb-1930    ADMISSION DATE:  01/25/2018  BRIEF PATIENT DESCRIPTION:  84 y.o. Male who underwent a Laparoscopic Cholecystectomy on 01/25/18.  Later in the evening pt became unresponsive briefly while getting up into his chair, nursing staff reported they were unable to palpate a pulse, and Code BLUE called.  Pt was moved from chair to bed and regained consciousness spontaneously, therefore CPR never had to be initiated.  Pt with syncopal episode likely in setting of orthostatic hypotension vs vagal episode.  SIGNIFICANT EVENTS  01/25/18>>Admitted after undergoing Laparoscopic Cholecystomy 01/25/18>>Pt briefly unresponsive likely secondary to vasovagal, transferred to ICU  STUDIES:  01/25/18 Echocardiogram>>  CULTURES:  Blood x2 09/27>> MRSA PCR 09/26>>negative   SUBJECTIVE:  Complaint of abdominal pain during inspiration   VITAL SIGNS: Temp:  [92.6 F (33.7 C)-98.3 F (36.8 C)] 97.8 F (36.6 C) (09/27 0900) Pulse Rate:  [56-85] 71 (09/27 0915) Resp:  [12-28] 28 (09/27 0900) BP: (75-177)/(32-97) 117/89 (09/27 0900) SpO2:  [88 %-100 %] 92 % (09/27 0900) FiO2 (%):  [3 %] 3 % (09/27 0330)  PHYSICAL EXAMINATION: General:  Acutely ill appearing male, laying in bed, on 2L [place], in NAD Neuro:  Awake alert and oriented, follows commands HEENT:  Atraumatic, normocephalic, no JVD, neck supple Cardiovascular:  RRR, s1s2, no M/R/G Lungs: Diminished bilaterally, even, nonlabored, normal effort Abdomen:  Soft, nontender, slightly distended, BS+ x4 Musculoskeletal:  No deformities, normal bulk and tone Skin:  Midline Abdominal incision clean dry and intact well approximated, JP drain to right abdomen  Recent Labs  Lab 01/25/18 1452 01/25/18 1938 01/26/18 0559  NA  --  135 133*  K  --  5.1 5.1  CL  --  103 99  CO2  --  21* 25  BUN  --  17 21  CREATININE 1.10 1.27* 1.23  GLUCOSE  --  222* 176*   Recent Labs  Lab 01/25/18 1938 01/26/18 0043  01/26/18 0559  HGB 9.3* 8.2* 7.8*  HCT 27.6* 24.3* 22.7*  WBC 24.1* 22.8* 16.0*  PLT 237 211 178   Dg Cholangiogram Operative  Result Date: 01/25/2018 CLINICAL DATA:  84 year old male undergoing cholangiogram through existing catheter. EXAM: INTRAOPERATIVE CHOLANGIOGRAM TECHNIQUE: Cholangiographic images from the C-arm fluoroscopic device were submitted for interpretation post-operatively. Please see the procedural report for the amount of contrast and the fluoroscopy time utilized. COMPARISON:  None. FINDINGS: Contrast injection was performed with fluoroscopic spot imaging. The images demonstrate a percutaneous cholecystostomy tube within the gallbladder. Filling defects are consistent with cholelithiasis. There is partial opacification of the common bile duct but no filling of the duodenum. IMPRESSION: 1. Cholelithiasis. 2. Partial visualization of the common bile duct which does not appear dilated. No definite contrast seen within the duodenum to confirm patency of the ampulla. Electronically Signed   By: Jacqulynn Cadet M.D.   On: 01/25/2018 10:34    ASSESSMENT / PLAN:  A: Unresponsiveness, likely syncopal episode in setting orthostatic hypotension vs vagal episode Hypotension, likely hypovolemic-resolved  Lactic acidosis Hx: HTN, HLD, CAD, MI s/p CABG, A-fib P: Continuous telemetry monitoring Aggressive fluid resuscitation to maintain map >65  LR @40  ml/hr Echo results pending  Continue amiodarone and digoxin, hold outpatient metoprolol for now due to hypotension  A: Acute Cholecystitis s/p laparoscopic cholecystectomy 01/25/18 Elevated LFT's P: Surgery following, appreciate input, will follow recommendations Pulmonary hygiene Trend LFT's  A: Anemia from acute blood loss P: Trend CBC  Monitor output from JP drain and surgical  site Monitor for s/sx of bleeding and transfuse for hgb <7 VTE px: SCD's for now   A: Acute Cholecystitis s/p laparoscopic cholecystectomy   P: Trend WBC and monitor fever curve  Will check lactic acid and trend PCT Continue zosyn and d/c vancomycin  Follow cultures   A: Hyperglycemia P: CBG's SSI Follow ICU Hypo/hyperglycemia protocol  Pain control: prn oxycodone and morphine  Continue clear liquid diet advance diet per surgery recommendations  Marda Stalker, [place] Pager [phone] (please enter 7 digits) PCCM Consult Pager [phone] (please enter 7 digits)

## 2018-01-27 DIAGNOSIS — D62 Acute posthemorrhagic anemia: Secondary | ICD-10-CM

## 2018-01-27 LAB — COMPREHENSIVE METABOLIC PANEL
ALT: 63 U/L — ABNORMAL HIGH (ref 0–44)
AST: 92 U/L — ABNORMAL HIGH (ref 15–41)
Albumin: 2.8 g/dL — ABNORMAL LOW (ref 3.5–5.0)
Alkaline Phosphatase: 51 U/L (ref 38–126)
Anion gap: 5 (ref 5–15)
BUN: 23 mg/dL (ref 8–23)
CO2: 27 mmol/L (ref 22–32)
Calcium: 7.5 mg/dL — ABNORMAL LOW (ref 8.9–10.3)
Chloride: 99 mmol/L (ref 98–111)
Creatinine, Ser: 1.21 mg/dL (ref 0.61–1.24)
GFR calc Af Amer: >60 mL/min (ref >60)
GFR calc non Af Amer: 52 mL/min — ABNORMAL LOW (ref >60)
Glucose, Bld: 119 mg/dL — ABNORMAL HIGH (ref 70–99)
Potassium: 4.8 mmol/L (ref 3.5–5.1)
Sodium: 131 mmol/L — ABNORMAL LOW (ref 135–145)
Total Bilirubin: 0.9 mg/dL (ref 0.3–1.2)
Total Protein: 5 g/dL — ABNORMAL LOW (ref 6.5–8.1)

## 2018-01-27 LAB — GLUCOSE, CAPILLARY
Glucose-Capillary: 103 mg/dL — ABNORMAL HIGH (ref 70–99)
Glucose-Capillary: 107 mg/dL — ABNORMAL HIGH (ref 70–99)
Glucose-Capillary: 107 mg/dL — ABNORMAL HIGH (ref 70–99)
Glucose-Capillary: 118 mg/dL — ABNORMAL HIGH (ref 70–99)
Glucose-Capillary: 98 mg/dL (ref 70–99)

## 2018-01-27 LAB — CBC WITH DIFFERENTIAL/PLATELET
Basophils Absolute: 0 10*3/uL (ref 0–0.1)
Basophils Relative: 0 %
Eosinophils Absolute: 0.1 10*3/uL (ref 0–0.7)
Eosinophils Relative: 1 %
HCT: 22.9 % — ABNORMAL LOW (ref 40.0–52.0)
Hemoglobin: 8 g/dL — ABNORMAL LOW (ref 13.0–18.0)
Lymphocytes Relative: 6 %
Lymphs Abs: 0.7 10*3/uL — ABNORMAL LOW (ref 1.0–3.6)
MCH: 31.1 pg (ref 26.0–34.0)
MCHC: 35 g/dL (ref 32.0–36.0)
MCV: 88.9 fL (ref 80.0–100.0)
Monocytes Absolute: 0.9 10*3/uL (ref 0.2–1.0)
Monocytes Relative: 8 %
Neutro Abs: 9.1 10*3/uL — ABNORMAL HIGH (ref 1.4–6.5)
Neutrophils Relative %: 85 %
Platelets: 150 10*3/uL (ref 150–440)
RBC: 2.58 MIL/uL — ABNORMAL LOW (ref 4.40–5.90)
RDW: 23 % — ABNORMAL HIGH (ref 11.5–14.5)
WBC: 10.7 10*3/uL — ABNORMAL HIGH (ref 3.8–10.6)

## 2018-01-27 LAB — HEMOGLOBIN AND HEMATOCRIT, BLOOD
HCT: 24.7 % — ABNORMAL LOW (ref 40.0–52.0)
HCT: 25.1 % — ABNORMAL LOW (ref 40.0–52.0)
Hemoglobin: 8.4 g/dL — ABNORMAL LOW (ref 13.0–18.0)
Hemoglobin: 8.7 g/dL — ABNORMAL LOW (ref 13.0–18.0)

## 2018-01-27 LAB — PHOSPHORUS: Phosphorus: 3 mg/dL (ref 2.5–4.6)

## 2018-01-27 LAB — MAGNESIUM: Magnesium: 1.9 mg/dL (ref 1.7–2.4)

## 2018-01-27 MED ORDER — TRAZODONE HCL 50 MG PO TABS
50.0000 mg | ORAL_TABLET | Freq: Every day | ORAL | Status: DC
  Administered 2018-01-27 – 2018-01-30 (×4): 50 mg via ORAL
  Filled 2018-01-27 (×4): qty 1

## 2018-01-27 NOTE — Progress Notes (Signed)
Subjective:  CC:  Charles Cook is a 84 y.o. male  Hospital stay day 2, 2 Days Post-Op lap chole, then return to OR for negative ex-lap concern for bleed  HPI: No issues.  Transferred to floor.   ROS:  A 5 point review of systems was performed and pertinent positives and negatives noted in HPI.   Objective:      Temp:  [98.1 F (36.7 C)-98.9 F (37.2 C)] 98.9 F (37.2 C) (09/28 2050) Pulse Rate:  [70-88] 79 (09/28 2050) Resp:  [15-32] 18 (09/28 2050) BP: (107-158)/(46-88) 152/69 (09/28 2050) SpO2:  [86 %-99 %] 98 % (09/28 2050)     Height: 6' (182.9 cm) Weight: 80.7 kg BMI (Calculated): 24.14   Intake/Output this shift:  No intake/output data recorded.   129ml of serosanguinous drainage.    Constitutional :  alert, cooperative, appears stated age, no distress and pleasently confused  Respiratory:  clear to auscultation bilaterally  Cardiovascular:  regular rate and rhythm  Gastrointestinal: soft, non-tender; bowel sounds normal; no masses,  no organomegaly and incision c/d/i.  JP with some sersanguinous drainage..   Skin: Cool and moist.   Psychiatric: Normal affect, non-agitated, not confused       LABS:  CMP Latest Ref Rng & Units 01/27/2018 01/26/2018 01/25/2018  Glucose 70 - 99 mg/dL 119(H) 176(H) 222(H)  BUN 8 - 23 mg/dL 23 21 17   Creatinine 0.61 - 1.24 mg/dL 1.21 1.23 1.27(H)  Sodium 135 - 145 mmol/L 131(L) 133(L) 135  Potassium 3.5 - 5.1 mmol/L 4.8 5.1 5.1  Chloride 98 - 111 mmol/L 99 99 103  CO2 22 - 32 mmol/L 27 25 21(L)  Calcium 8.9 - 10.3 mg/dL 7.5(L) 7.9(L) 8.1(L)  Total Protein 6.5 - 8.1 g/dL 5.0(L) 5.6(L) 5.1(L)  Total Bilirubin 0.3 - 1.2 mg/dL 0.9 0.9 0.8  Alkaline Phos 38 - 126 U/L 51 53 61  AST 15 - 41 U/L 92(H) 170(H) 158(H)  ALT 0 - 44 U/L 63(H) 107(H) 105(H)   CBC Latest Ref Rng & Units 01/27/2018 01/27/2018 01/27/2018  WBC 3.8 - 10.6 K/uL - 10.7(H) -  Hemoglobin 13.0 - 18.0 g/dL 8.7(L) 8.0(L) 8.4(L)  Hematocrit 40.0 - 52.0 % 25.1(L) 22.9(L)  24.7(L)  Platelets 150 - 440 K/uL - 150 -    RADS: n/a Assessment:   S/p lap chole, then return to OR for negative ex-lap concern for bleed.  No obvious evidence of bile leak from drain and labs are reassuring as well.  No evidence of continued bleeding.  Continue to advance diet as tolerated.   Appreciate hospitalist recs.

## 2018-01-27 NOTE — Progress Notes (Signed)
Uncomplicated past 24 hours.  I have placed order to transfer to [place] floor with cardiac monitoring.  I have placed order to complete 5 days pip-tazobactam.  After transfer, PCCM will sign off. Please call if we can be of further assistance    Merton Border, MD PCCM service Mobile [phone] Pager [phone] 01/27/2018 12:58 PM

## 2018-01-27 NOTE — Progress Notes (Signed)
Patient ID: Charles Cook, male   DOB: [birth year], 84 y.o.   MRN: [id number]  [employer] PROGRESS NOTE  Charles Cook OHK:[id number] DOB: 1929-12-17 DOA: 01/25/2018 PCP: Mar Daring, PA-C  HPI/Subjective: Patient feeling better.  Cannot recall what happened.  As per daughter his memory has been off.  Patient did not sleep last night and thought he was in the bank and thought friends were visiting.  Objective: Vitals:   01/27/18 1002 01/27/18 1100  BP:    Pulse: 73 76  Resp:  (!) 25  Temp:    SpO2:  97%    Filed Weights   01/25/18 0756  Weight: 80.7 kg    ROS: Review of Systems  Constitutional: Negative for chills and fever.  Eyes: Negative for blurred vision.  Respiratory: Negative for cough and shortness of breath.   Cardiovascular: Negative for chest pain.  Gastrointestinal: Positive for abdominal pain. Negative for constipation, diarrhea, nausea and vomiting.  Genitourinary: Negative for dysuria.  Musculoskeletal: Negative for joint pain.  Neurological: Negative for dizziness and headaches.   Exam: Physical Exam  HENT:  Nose: No mucosal edema.  Mouth/Throat: No oropharyngeal exudate or posterior oropharyngeal edema.  Eyes: Pupils are equal, round, and reactive to light. Conjunctivae, EOM and lids are normal.  Neck: No JVD present. Carotid bruit is not present. No edema present. No thyroid mass and no thyromegaly present.  Cardiovascular: S1 normal and S2 normal. Exam reveals no gallop.  No murmur heard. Pulses:      Dorsalis pedis pulses are 2+ on the right side, and 2+ on the left side.  Respiratory: No respiratory distress. He has decreased breath sounds in the right lower field and the left lower field. He has no wheezes. He has no rhonchi. He has no rales.  GI: Soft. Bowel sounds are decreased. There is tenderness.  Musculoskeletal:       Right ankle: He exhibits no swelling.       Left ankle: He exhibits no swelling.  Lymphadenopathy:    He has no  cervical adenopathy.  Neurological: He is alert. No cranial nerve deficit.  Skin: Skin is warm. No rash noted. Nails show no clubbing.  Psychiatric: He has a normal mood and affect.      Data Reviewed: Basic Metabolic Panel: Recent Labs  Lab 01/25/18 1452 01/25/18 1938 01/26/18 0559 01/27/18 0505  NA  --  135 133* 131*  K  --  5.1 5.1 4.8  CL  --  103 99 99  CO2  --  21* 25 27  GLUCOSE  --  222* 176* 119*  BUN  --  17 21 23   CREATININE 1.10 1.27* 1.23 1.21  CALCIUM  --  8.1* 7.9* 7.5*  MG  --   --   --  1.9  PHOS  --   --   --  3.0   Liver Function Tests: Recent Labs  Lab 01/25/18 1938 01/26/18 0559 01/27/18 0505  AST 158* 170* 92*  ALT 105* 107* 63*  ALKPHOS 61 53 51  BILITOT 0.8 0.9 0.9  PROT 5.1* 5.6* 5.0*  ALBUMIN 2.9* 3.3* 2.8*   CBC: Recent Labs  Lab 01/25/18 1938 01/26/18 0043 01/26/18 0559 01/26/18 1203 01/26/18 2124 01/27/18 0211 01/27/18 0505 01/27/18 0907  WBC 24.1* 22.8* 16.0*  --  13.5*  --  10.7*  --   NEUTROABS 21.9*  --   --   --   --   --  9.1*  --  HGB 9.3* 8.2* 7.8* 7.1* 8.5* 8.4* 8.0* 8.7*  HCT 27.6* 24.3* 22.7* 20.7* 24.2* 24.7* 22.9* 25.1*  MCV 100.6* 98.7 98.5  --  90.6  --  88.9  --   PLT 237 211 178  --  158  --  150  --    Cardiac Enzymes: Recent Labs  Lab 01/25/18 1938 01/26/18 0559  TROPONINI <0.03 0.03*    CBG: Recent Labs  Lab 01/26/18 1938 01/26/18 2357 01/27/18 0331 01/27/18 0717 01/27/18 1154  GLUCAP 118* 118* 107* 103* 107*    Recent Results (from the past 240 hour(s))  MRSA PCR Screening     Status: None   Collection Time: 01/25/18  7:40 PM  Result Value Ref Range Status   MRSA by PCR NEGATIVE NEGATIVE Final    Comment:        The GeneXpert MRSA Assay (FDA approved for NASAL specimens only), is one component of a comprehensive MRSA colonization surveillance program. It is not intended to diagnose MRSA infection nor to guide or monitor treatment for MRSA infections. Performed at [facility], [address] [place], [place] [address]   CULTURE, BLOOD (ROUTINE X 2) w Reflex to ID Panel     Status: None (Preliminary result)   Collection Time: 01/26/18  6:02 AM  Result Value Ref Range Status   Specimen Description BLOOD RAC  Final   Special Requests   Final    BOTTLES DRAWN AEROBIC AND ANAEROBIC Blood Culture adequate volume   Culture   Final    NO GROWTH < 24 HOURS Performed at [facility], [address], [place], [place] [address]    Report Status PENDING  Incomplete  CULTURE, BLOOD (ROUTINE X 2) w Reflex to ID Panel     Status: None (Preliminary result)   Collection Time: 01/26/18  6:02 AM  Result Value Ref Range Status   Specimen Description BLOOD RFA  Final   Special Requests   Final    BOTTLES DRAWN AEROBIC AND ANAEROBIC Blood Culture adequate volume   Culture   Final    NO GROWTH < 24 HOURS Performed at [facility], [address] [place], [place] [address]    Report Status PENDING  Incomplete     Scheduled Meds: . acetaminophen  1,000 mg Oral Q6H  . amiodarone  200 mg Oral Daily  . digoxin  0.125 mg Oral Daily  . fluticasone  2 spray Each Nare Daily  . mouth rinse  15 mL Mouth Rinse BID  . pantoprazole  40 mg Oral Daily  . pneumococcal 23 valent vaccine  0.5 mL Intramuscular Tomorrow-1000  . traZODone  50 mg Oral QHS   Continuous Infusions: . piperacillin-tazobactam (ZOSYN)  IV 3.375 g (01/27/18 1358)    Assessment/Plan:  1. Delirium with being in the ICU and having to surgical procedures.  Trazodone at night to try to get sleeping.   2. Orthostatic hypotension and volume loss.  Patient had unresponsive syncopal events.  Patient was transfused a unit of packed red blood cells on a hemoglobin of 7.1.  Last hemoglobin 8.0. 3. Acute cholecystitis status post 2 surgical procedures.  One procedure for gallbladder removal and another procedure for postoperative bleeding.  Pain control.  Patient on liquid diet.  Empiric  antibiotics with Zosyn. 4. History of atrial fibrillation on amiodarone and digoxin. 5. Acute blood loss anemia.  Status post 1 unit of packed red blood cells yesterday 6. History of bladder cancer, history of prostate cancer  Code Status:     Code Status Orders  (From admission, onward)         Start     Ordered   01/25/18 1429  Full code  Continuous     01/25/18 1428        Code Status History    Date Active Date Inactive Code Status Order ID Comments User Context   11/12/2017 1450 11/15/2017 2141 Full Code [id number]  Epifanio Lesches, MD ED   10/05/2015 2157 10/09/2015 1834 Full Code [id number]  Gladstone Lighter, MD ED     Family Communication: Daughter at the bedside Disposition Plan: To be determined  Consultants:  General surgery  Procedures:  Cholecystectomy  Postoperative bleeding procedure  Antibiotics:  Zosyn  Time spent: 28 minutes  [place]

## 2018-01-27 NOTE — Progress Notes (Signed)
Pharmacy Antibiotic Note  Charles Cook is a 84 y.o. male admitted on 01/25/2018 to ICU s/p Code Blue. Patient had laparoscopic cholecystectomy on 9/26.  Pharmacy has been consulted for vancomycin and Zosyn dosing for possible intraabdominal pathogens.   Vancomycin discontinued 9/27  Plan: Continue Zosyn EI 3.375g IV Q8hr.  Stop date entered as 10/2  Height: 6' (182.9 cm) Weight: 178 lb (80.7 kg) IBW/kg (Calculated) : 77.6  Temp (24hrs), Avg:98.6 F (37 C), Min:97.9 F (36.6 C), Max:99.5 F (37.5 C)  Recent Labs  Lab 01/25/18 1452 01/25/18 1938 01/25/18 2051 01/25/18 2336 01/26/18 0043 01/26/18 0559 01/26/18 0622 01/26/18 0939 01/26/18 1203 01/26/18 2124 01/27/18 0505  WBC 18.7* 24.1*  --   --  22.8* 16.0*  --   --   --  13.5* 10.7*  CREATININE 1.10 1.27*  --   --   --  1.23  --   --   --   --  1.21  LATICACIDVEN  --   --  5.3* 5.2*  --   --  3.0* 3.4* 2.1*  --   --     Estimated Creatinine Clearance: 48.1 mL/min (by C-G formula based on SCr of 1.21 mg/dL).    Allergies  Allergen Reactions  . Moxifloxacin Hcl In Nacl Other (See Comments)    Confusion     Antimicrobials this admission: Cefazolin 9/26 >> 9/27 Vancomycin 9/27 x 1  Zosyn 9/27 >>   Dose adjustments this admission: N/A  Microbiology results: 9/27 BCx: NG TD 9/27 MRSA PCR: negative   Thank you for allowing pharmacy to be a part of this patient's care.  Pernell Dupre, PharmD, BCPS Clinical Pharmacist 01/27/2018 11:09 AM

## 2018-01-27 NOTE — Progress Notes (Signed)
Report given to [facility] RN. Family notified on patient transfer.

## 2018-01-28 LAB — BASIC METABOLIC PANEL
Anion gap: 5 (ref 5–15)
BUN: 16 mg/dL (ref 8–23)
CO2: 28 mmol/L (ref 22–32)
Calcium: 7.5 mg/dL — ABNORMAL LOW (ref 8.9–10.3)
Chloride: 98 mmol/L (ref 98–111)
Creatinine, Ser: 0.98 mg/dL (ref 0.61–1.24)
GFR calc Af Amer: >60 mL/min (ref >60)
GFR calc non Af Amer: >60 mL/min (ref >60)
Glucose, Bld: 113 mg/dL — ABNORMAL HIGH (ref 70–99)
Potassium: 4.3 mmol/L (ref 3.5–5.1)
Sodium: 131 mmol/L — ABNORMAL LOW (ref 135–145)

## 2018-01-28 LAB — CBC
HCT: 22.5 % — ABNORMAL LOW (ref 40.0–52.0)
Hemoglobin: 8 g/dL — ABNORMAL LOW (ref 13.0–18.0)
MCH: 32 pg (ref 26.0–34.0)
MCHC: 35.5 g/dL (ref 32.0–36.0)
MCV: 90 fL (ref 80.0–100.0)
Platelets: 168 10*3/uL (ref 150–440)
RBC: 2.5 MIL/uL — ABNORMAL LOW (ref 4.40–5.90)
RDW: 22.8 % — ABNORMAL HIGH (ref 11.5–14.5)
WBC: 9.1 10*3/uL (ref 3.8–10.6)

## 2018-01-28 LAB — GLUCOSE, CAPILLARY
Glucose-Capillary: 92 mg/dL (ref 70–99)
Glucose-Capillary: 119 mg/dL — ABNORMAL HIGH (ref 70–99)

## 2018-01-28 NOTE — Progress Notes (Signed)
Patient ID: Charles Cook, male   DOB: March 05, 1930, 84 y.o.   MRN: [id number]  [employer] PROGRESS NOTE  ZARYAN YAKUBOV TTS:[id number] DOB: [birth year] DOA: 01/25/2018 PCP: Mar Daring, PA-C  HPI/Subjective: Patient answering some yes and no questions.  Has some abdominal pain.  Family interested in going home rather than rehab.  Just started on a diet this morning.  As per nursing staff slept on and off last night.  Objective: Vitals:   01/27/18 2050 01/28/18 0514  BP: (!) 152/69 (!) 138/50  Pulse: 79 74  Resp: 18 20  Temp: 98.9 F (37.2 C) 98 F (36.7 C)  SpO2: 98% 98%    Filed Weights   01/25/18 0756  Weight: 80.7 kg    ROS: Review of Systems  Constitutional: Negative for chills and fever.  Eyes: Negative for blurred vision.  Respiratory: Negative for cough and shortness of breath.   Cardiovascular: Negative for chest pain.  Gastrointestinal: Positive for abdominal pain. Negative for constipation, diarrhea, nausea and vomiting.  Genitourinary: Negative for dysuria.  Musculoskeletal: Negative for joint pain.  Neurological: Negative for dizziness and headaches.   Exam: Physical Exam  HENT:  Nose: No mucosal edema.  Mouth/Throat: No oropharyngeal exudate or posterior oropharyngeal edema.  Eyes: Pupils are equal, round, and reactive to light. Conjunctivae, EOM and lids are normal.  Neck: No JVD present. Carotid bruit is not present. No edema present. No thyroid mass and no thyromegaly present.  Cardiovascular: S1 normal and S2 normal. Exam reveals no gallop.  No murmur heard. Pulses:      Dorsalis pedis pulses are 2+ on the right side, and 2+ on the left side.  Respiratory: No respiratory distress. He has decreased breath sounds in the right lower field and the left lower field. He has no wheezes. He has no rhonchi. He has no rales.  GI: Soft. Bowel sounds are decreased. There is tenderness.  Musculoskeletal:       Right ankle: He exhibits no swelling.   Left ankle: He exhibits no swelling.  Lymphadenopathy:    He has no cervical adenopathy.  Neurological: He is alert. No cranial nerve deficit.  Skin: Skin is warm. No rash noted. Nails show no clubbing.  Psychiatric: He has a normal mood and affect.      Data Reviewed: Basic Metabolic Panel: Recent Labs  Lab 01/25/18 1452 01/25/18 1938 01/26/18 0559 01/27/18 0505 01/28/18 0608  NA  --  135 133* 131* 131*  K  --  5.1 5.1 4.8 4.3  CL  --  103 99 99 98  CO2  --  21* 25 27 28   GLUCOSE  --  222* 176* 119* 113*  BUN  --  17 21 23 16   CREATININE 1.10 1.27* 1.23 1.21 0.98  CALCIUM  --  8.1* 7.9* 7.5* 7.5*  MG  --   --   --  1.9  --   PHOS  --   --   --  3.0  --    Liver Function Tests: Recent Labs  Lab 01/25/18 1938 01/26/18 0559 01/27/18 0505  AST 158* 170* 92*  ALT 105* 107* 63*  ALKPHOS 61 53 51  BILITOT 0.8 0.9 0.9  PROT 5.1* 5.6* 5.0*  ALBUMIN 2.9* 3.3* 2.8*   CBC: Recent Labs  Lab 01/25/18 1938 01/26/18 0043 01/26/18 0559  01/26/18 2124 01/27/18 0211 01/27/18 0505 01/27/18 0907 01/28/18 0608  WBC 24.1* 22.8* 16.0*  --  13.5*  --  10.7*  --  9.1  NEUTROABS 21.9*  --   --   --   --   --  9.1*  --   --   HGB 9.3* 8.2* 7.8*   < > 8.5* 8.4* 8.0* 8.7* 8.0*  HCT 27.6* 24.3* 22.7*   < > 24.2* 24.7* 22.9* 25.1* 22.5*  MCV 100.6* 98.7 98.5  --  90.6  --  88.9  --  90.0  PLT 237 211 178  --  158  --  150  --  168   < > = values in this interval not displayed.   Cardiac Enzymes: Recent Labs  Lab 01/25/18 1938 01/26/18 0559  TROPONINI <0.03 0.03*    CBG: Recent Labs  Lab 01/27/18 0331 01/27/18 0717 01/27/18 1154 01/27/18 1550 01/28/18 0801  GLUCAP 107* 103* 107* 98 92    Recent Results (from the past 240 hour(s))  MRSA PCR Screening     Status: None   Collection Time: 01/25/18  7:40 PM  Result Value Ref Range Status   MRSA by PCR NEGATIVE NEGATIVE Final    Comment:        The GeneXpert MRSA Assay (FDA approved for NASAL specimens only), is one  component of a comprehensive MRSA colonization surveillance program. It is not intended to diagnose MRSA infection nor to guide or monitor treatment for MRSA infections. Performed at [facility], [address] [place], [place] [address]   CULTURE, BLOOD (ROUTINE X 2) w Reflex to ID Panel     Status: None (Preliminary result)   Collection Time: 01/26/18  6:02 AM  Result Value Ref Range Status   Specimen Description BLOOD RAC  Final   Special Requests   Final    BOTTLES DRAWN AEROBIC AND ANAEROBIC Blood Culture adequate volume   Culture   Final    NO GROWTH 2 DAYS Performed at [facility], [address] [place], [place] [address]    Report Status PENDING  Incomplete  CULTURE, BLOOD (ROUTINE X 2) w Reflex to ID Panel     Status: None (Preliminary result)   Collection Time: 01/26/18  6:02 AM  Result Value Ref Range Status   Specimen Description BLOOD RFA  Final   Special Requests   Final    BOTTLES DRAWN AEROBIC AND ANAEROBIC Blood Culture adequate volume   Culture   Final    NO GROWTH 2 DAYS Performed at [facility], [address] [place], [place] [address]    Report Status PENDING  Incomplete     Scheduled Meds: . acetaminophen  1,000 mg Oral Q6H  . amiodarone  200 mg Oral Daily  . digoxin  0.125 mg Oral Daily  . fluticasone  2 spray Each Nare Daily  . mouth rinse  15 mL Mouth Rinse BID  . pantoprazole  40 mg Oral Daily  . pneumococcal 23 valent vaccine  0.5 mL Intramuscular Tomorrow-1000  . traZODone  50 mg Oral QHS   Continuous Infusions: . piperacillin-tazobactam (ZOSYN)  IV 12.5 mL/hr at 01/28/18 0605    Assessment/Plan:  1. Acute delirium.  Seems a little bit better today.  Trazodone at night to try to get sleeping.   2. Orthostatic hypotension and volume loss.  Patient had unresponsive syncopal events.  Last hemoglobin 8.0.  Patient status post transfusion on Friday. 3. Acute cholecystitis status post 2 surgical procedures.   One procedure for gallbladder removal and another procedure for postoperative bleeding.  Pain control.  Patient on solid food today.  Empiric  antibiotics with Zosyn. 4. History of atrial fibrillation on amiodarone and digoxin. 5. Acute blood loss anemia.  Status post 1 unit of packed red blood cells Friday 6. History of bladder cancer, history of prostate cancer 7. Weakness.  Physical therapy evaluation 8. Discontinue Foley catheter  Code Status:     Code Status Orders  (From admission, onward)         Start     Ordered   01/25/18 1429  Full code  Continuous     01/25/18 1428        Code Status History    Date Active Date Inactive Code Status Order ID Comments User Context   11/12/2017 1450 11/15/2017 2141 Full Code [id number]  Epifanio Lesches, MD ED   10/05/2015 2157 10/09/2015 1834 Full Code [id number]  Gladstone Lighter, MD ED     Family Communication: Daughter at the bedside Disposition Plan: Family hoping to take patient home and avoiding rehab if possible.  Consultants:  General surgery  Procedures:  Cholecystectomy  Postoperative bleeding procedure  Antibiotics:  Zosyn  Time spent: 27 minutes  [place]

## 2018-01-28 NOTE — Progress Notes (Signed)
Subjective:  CC:  Charles Cook is a 84 y.o. male  Hospital stay day 3, 3 Days Post-Op lap chole, then return to OR for negative ex-lap concern for bleed  HPI: No issues.  Reported overnight.  ROS:  A 5 point review of systems was performed and pertinent positives and negatives noted in HPI.   Objective:      Temp:  [98 F (36.7 C)-98.9 F (37.2 C)] 98 F (36.7 C) (09/29 0514) Pulse Rate:  [72-88] 74 (09/29 0514) Resp:  [17-20] 20 (09/29 0514) BP: (138-158)/(50-69) 138/50 (09/29 0514) SpO2:  [95 %-99 %] 98 % (09/29 0514)     Height: 6' (182.9 cm) Weight: 80.7 kg BMI (Calculated): 24.14   Intake/Output this shift:  Total I/O In: 360 [Other:360] Out: -    205 ml of serosanguinous drainage, past 24hrs.    Constitutional :  alert, cooperative, appears stated age, no distress and pleasently confused  Respiratory:  clear to auscultation bilaterally  Cardiovascular:  regular rate and rhythm  Gastrointestinal: soft, non-tender; bowel sounds normal; no masses,  no organomegaly and incision c/d/i.  JP with some sersanguinous drainage..   Skin: Cool and moist.   Psychiatric: Normal affect, non-agitated, not confused       LABS:  CMP Latest Ref Rng & Units 01/28/2018 01/27/2018 01/26/2018  Glucose 70 - 99 mg/dL 113(H) 119(H) 176(H)  BUN 8 - 23 mg/dL 16 23 21   Creatinine 0.61 - 1.24 mg/dL 0.98 1.21 1.23  Sodium 135 - 145 mmol/L 131(L) 131(L) 133(L)  Potassium 3.5 - 5.1 mmol/L 4.3 4.8 5.1  Chloride 98 - 111 mmol/L 98 99 99  CO2 22 - 32 mmol/L 28 27 25   Calcium 8.9 - 10.3 mg/dL 7.5(L) 7.5(L) 7.9(L)  Total Protein 6.5 - 8.1 g/dL - 5.0(L) 5.6(L)  Total Bilirubin 0.3 - 1.2 mg/dL - 0.9 0.9  Alkaline Phos 38 - 126 U/L - 51 53  AST 15 - 41 U/L - 92(H) 170(H)  ALT 0 - 44 U/L - 63(H) 107(H)   CBC Latest Ref Rng & Units 01/28/2018 01/27/2018 01/27/2018  WBC 3.8 - 10.6 K/uL 9.1 - 10.7(H)  Hemoglobin 13.0 - 18.0 g/dL 8.0(L) 8.7(L) 8.0(L)  Hematocrit 40.0 - 52.0 % 22.5(L) 25.1(L) 22.9(L)   Platelets 150 - 440 K/uL 168 - 150    RADS: n/a Assessment:   S/p lap chole, then return to OR for negative ex-lap concern for bleed.  Still no obvious evidence of bile leak from drain and labs are reassuring as well.  No evidence of continued bleeding.  hgb 8.0 today, which is stable from two days ago. advance diet as tolerated.   Appreciate hospitalist recs.  Likely d/c tomorrow

## 2018-01-29 ENCOUNTER — Telehealth: Payer: Self-pay | Admitting: Physician Assistant

## 2018-01-29 ENCOUNTER — Inpatient Hospital Stay: Payer: Medicare Other

## 2018-01-29 LAB — CBC WITH DIFFERENTIAL/PLATELET
Basophils Absolute: 0 10*3/uL (ref 0–0.1)
Basophils Relative: 0 %
Eosinophils Absolute: 0.7 10*3/uL (ref 0–0.7)
Eosinophils Relative: 9 %
HCT: 25.8 % — ABNORMAL LOW (ref 40.0–52.0)
Hemoglobin: 9 g/dL — ABNORMAL LOW (ref 13.0–18.0)
Lymphocytes Relative: 6 %
Lymphs Abs: 0.5 10*3/uL — ABNORMAL LOW (ref 1.0–3.6)
MCH: 31.4 pg (ref 26.0–34.0)
MCHC: 35.1 g/dL (ref 32.0–36.0)
MCV: 89.6 fL (ref 80.0–100.0)
Monocytes Absolute: 0.8 10*3/uL (ref 0.2–1.0)
Monocytes Relative: 10 %
Neutro Abs: 6.4 10*3/uL (ref 1.4–6.5)
Neutrophils Relative %: 75 %
Platelets: 190 10*3/uL (ref 150–440)
RBC: 2.88 MIL/uL — ABNORMAL LOW (ref 4.40–5.90)
RDW: 21.8 % — ABNORMAL HIGH (ref 11.5–14.5)
WBC: 8.4 10*3/uL (ref 3.8–10.6)

## 2018-01-29 LAB — GLUCOSE, CAPILLARY
Glucose-Capillary: 104 mg/dL — ABNORMAL HIGH (ref 70–99)
Glucose-Capillary: 109 mg/dL — ABNORMAL HIGH (ref 70–99)
Glucose-Capillary: 82 mg/dL (ref 70–99)

## 2018-01-29 MED ORDER — POLYETHYLENE GLYCOL 3350 17 G PO PACK
17.0000 g | PACK | Freq: Every day | ORAL | Status: DC
  Administered 2018-01-29 – 2018-01-31 (×2): 17 g via ORAL
  Filled 2018-01-29 (×3): qty 1

## 2018-01-29 MED ORDER — IPRATROPIUM-ALBUTEROL 0.5-2.5 (3) MG/3ML IN SOLN
3.0000 mL | Freq: Four times a day (QID) | RESPIRATORY_TRACT | Status: DC
  Administered 2018-01-29 (×2): 3 mL via RESPIRATORY_TRACT
  Filled 2018-01-29 (×2): qty 3

## 2018-01-29 MED ORDER — IPRATROPIUM-ALBUTEROL 0.5-2.5 (3) MG/3ML IN SOLN
3.0000 mL | Freq: Three times a day (TID) | RESPIRATORY_TRACT | Status: DC
  Administered 2018-01-30 – 2018-01-31 (×5): 3 mL via RESPIRATORY_TRACT
  Filled 2018-01-29 (×5): qty 3

## 2018-01-29 MED ORDER — INFLUENZA VAC SPLIT HIGH-DOSE 0.5 ML IM SUSY
0.5000 mL | PREFILLED_SYRINGE | Freq: Once | INTRAMUSCULAR | Status: AC
  Administered 2018-01-29: 0.5 mL via INTRAMUSCULAR
  Filled 2018-01-29: qty 0.5

## 2018-01-29 NOTE — Progress Notes (Signed)
[facility] Progress Note  4 Days Post-Op  Subjective: Feeling better this morning. He does note some abdominal discomfort this morning but no complaints of nausea or emesis. Has been tolerating a diet. No BM. Has not been mobilizing,. No complaints of fever, chills, CP or SOB.    Objective: Vital signs in last 24 hours: Temp:  [97.4 F (36.3 C)-97.9 F (36.6 C)] 97.5 F (36.4 C) (09/30 0428) Pulse Rate:  [69-78] 78 (09/30 0831) Resp:  [16-22] 20 (09/30 0428) BP: (109-157)/(56-80) 109/80 (09/30 0831) SpO2:  [92 %-100 %] 92 % (09/30 0831) Last BM Date: (UTA-pt does not remember)  Intake/Output from previous day: 09/29 0701 - 09/30 0700 In: 1102.9 [P.O.:600; IV Piggyback:142.9] Out: 765 [Urine:650; Drains:115] Intake/Output this shift: Total I/O In: -  Out: 200 [Urine:200]  PE: Gen:  Alert, NAD, pleasant Card:  Regular rate and rhythm Pulm:  Normal effort, clear to auscultation bilaterally Abd: Soft, tenderness near midline incision, non-distended, bowel sounds present in all 4 quadrants, incisions C/D/I without erythema, JP drain in RUQ with serous fluid present in bulb, no drainage around site Skin: warm and dry, no rashes  Psych: A&Ox3   Lab Results:  Recent Labs    01/28/18 0608 01/29/18 0512  WBC 9.1 8.4  HGB 8.0* 9.0*  HCT 22.5* 25.8*  PLT 168 190   BMET Recent Labs    01/27/18 0505 01/28/18 0608  NA 131* 131*  K 4.8 4.3  CL 99 98  CO2 27 28  GLUCOSE 119* 113*  BUN 23 16  CREATININE 1.21 0.98  CALCIUM 7.5* 7.5*   PT/INR No results for input(s): LABPROT, INR in the last 72 hours. CMP     Component Value Date/Time   NA 131 (L) 01/28/2018 0608   NA 136 11/21/2017 1145   K 4.3 01/28/2018 0608   CL 98 01/28/2018 0608   CO2 28 01/28/2018 0608   GLUCOSE 113 (H) 01/28/2018 0608   BUN 16 01/28/2018 0608   BUN 10 11/21/2017 1145   CREATININE 0.98 01/28/2018 0608   CALCIUM 7.5 (L) 01/28/2018 0608   PROT 5.0 (L) 01/27/2018 0505   PROT 5.4 (L) 11/21/2017 1145   ALBUMIN 2.8 (L) 01/27/2018 0505   ALBUMIN 3.5 11/21/2017 1145   AST 92 (H) 01/27/2018 0505   ALT 63 (H) 01/27/2018 0505   ALKPHOS 51 01/27/2018 0505   BILITOT 0.9 01/27/2018 0505   BILITOT 0.3 11/21/2017 1145   GFRNONAA >60 01/28/2018 0608   GFRAA >60 01/28/2018 0608   Lipase     Component Value Date/Time   LIPASE 23 11/12/2017 0855       Studies/Results: No results found.  Anti-infectives: Anti-infectives (From admission, onward)   Start     Dose/Rate Route Frequency Ordered Stop   01/26/18 1000  vancomycin (VANCOCIN) IVPB 750 mg/150 ml premix  Status:  Discontinued     750 mg 150 mL/hr over 60 Minutes Intravenous Every 12 hours 01/26/18 0533 01/26/18 1039   01/26/18 0600  piperacillin-tazobactam (ZOSYN) IVPB 3.375 g     3.375 g 12.5 mL/hr over 240 Minutes Intravenous Every 8 hours 01/26/18 0241 01/31/18 0559   01/26/18 0245  vancomycin (VANCOCIN) IVPB 1000 mg/200 mL premix     1,000 mg 200 mL/hr over 60 Minutes Intravenous  Once 01/26/18 0241 01/26/18 0505   01/25/18 2345  ceFAZolin (ANCEF) IVPB 2g/100 mL premix     2 g 200 mL/hr over 30 Minutes Intravenous On call to O.R. 01/25/18 2336 01/26/18 8657  01/25/18 0803  ceFAZolin (ANCEF) 2-4 GM/100ML-% IVPB    Note to Pharmacy:  Phineas Real   : cabinet override      01/25/18 0803 01/25/18 2029   01/25/18 0600  ceFAZolin (ANCEF) IVPB 2g/100 mL premix     2 g 200 mL/hr over 30 Minutes Intravenous On call to O.R. 01/24/18 2207 01/25/18 0917       Assessment/Plan  Cholecystitis with subsequent exploratory laparotomy for bleeding - POD4, improvement in abdominal pain and mentation this morning, VSS, tolerating a diet - Hgb to 9.0 this morning from 8.0, last transfusion 09/27, continue to monitor - No leukocytosis - Tolerating full diet, monitor for BM, will try Miralax - JP with 115 ccs in last 12 hours per chart review, appears serous, was some concern for bili leak however T. Bili  normalized. JP will remain as output too high.  - Continue IV ABx - Pain control as needed - PT to evaluate this morning, appreciate their help - Hospitalist following, obtaining CXR this morning, appreciate their help and input.    LOS: 4 days    Edison Simon , PA-C [facility] 01/29/2018, 8:51 AM [phone] M-F: 7am - 4pm

## 2018-01-29 NOTE — Telephone Encounter (Signed)
Spoke with [name] and nurse at the hospital.

## 2018-01-29 NOTE — Addendum Note (Signed)
Addendum  created 01/29/18 0915 by Doreen Salvage, CRNA   Charge Capture section accepted

## 2018-01-29 NOTE — Care Management Important Message (Signed)
Copy of signed IM left with patient in room.  

## 2018-01-29 NOTE — Care Management Note (Signed)
Case Management Note  Patient Details  Name: Charles Cook MRN: [id number] Date of Birth: 1929/08/02   Patient admitted Acute cholecystitis status post 2 surgical procedures.  One procedure for gallbladder removal and another procedure for postoperative bleeding.  Patient lives at home with wife.  Patient states that his wife has a private caregiver that comes out daily from 8am-6pm. PCP [place].  Patient no longer drives. Relies on family and caregiver for transportation.  Patient has a RW and cane in the home.  Patient has previously been open with [name].    PT has assessed patient and recommending SNF.  Originally patient and family wanted to purse home with home health.  When [name] met with patient he no states "im not sure what to do, either of them have their perks".  Patient request that I call his daughter [name] to discuss disposition.  [name] states that she feels that her father will need rehab at discharge.  Her preference is [facility].  CSW updated.  [name] states that she will discuss with her sister, and father, then contact RNCM in the morning with a final determination. RNCM following    Subjective/Objective:                    Action/Plan:   Expected Discharge Date:  01/27/18               Expected Discharge Plan:     In-House Referral:     Discharge planning Services     Post Acute Care Choice:    Choice offered to:     DME Arranged:    DME Agency:     HH Arranged:    HH Agency:     Status of Service:     If discussed at [employer], dates discussed:    Additional Comments:  [name], RN 01/29/2018, 4:04 PM

## 2018-01-29 NOTE — Evaluation (Signed)
Physical Therapy Evaluation Patient Details Name: Charles Cook MRN: [id number] DOB: 12/29/1929 Today's Date: 01/29/2018   History of Present Illness  Patient is an 84 year old male admitted for cholecystitis and received a cholecystectomy on 01/26/2018.  PMH includes prostate CA, MI, HLD, HTN, depression, cardiomyopathy, hiatal hernia, bladder CA and atrial fibrillation.  Clinical Impression  Patient is an 84 year old male who lives in a single story home with his wife.  He is generally active with use of a quad cane and occasional use of a RW at baseline, but has experienced a gradual decline in health and mobility in the past several weeks. This is according to pt's daughter who was present throughout the evaluation.  Pt was alert and oriented and motivated to work with therapy to improve mobility.  Pt presented with mild decrease of UE strength and fair LE strength, though he reported "stiffness and weakness" for all movement.  He required min A for bed mobility and was receptive to education for reduction of abdominal pain.  Pt's O2 began to decline when sitting at EOB and standing and PT placed pt on 1L of O2.  He was able to return to >90% but reported feeling light-headed so PT assisted pt in a standing transfer to chair.  Pt was very unsteady on his feet and reported increase in abdominal pain.  He maintained a flexed stance and also required VC's for use of RW.  PA and x-ray in to pt's room at end of session.  Pt will continue to benefit from skilled PT with focus on strengthening, safe functional mobility and proper use of AD for fall prevention.    Follow Up Recommendations SNF    Equipment Recommendations  None recommended by PT    Recommendations for Other Services       Precautions / Restrictions Precautions Precautions: Fall Precaution Comments: Recent fall hx/High fall risk Restrictions Weight Bearing Restrictions: No      Mobility  Bed Mobility Overal bed mobility: Needs  Assistance Bed Mobility: Supine to Sit;Sit to Supine     Supine to sit: Min assist Sit to supine: Min assist   General bed mobility comments: Can move LE's over EOB but expressed sharp pain increase with this.  PT educated pt and pt's daughter in log rolling technique to push up to sitting from side and both expressed understanding.  Transfers Overall transfer level: Needs assistance Equipment used: Rolling walker (2 wheeled) Transfers: Sit to/from [facility] Sit to Stand: Min assist Stand pivot transfers: Min assist       General transfer comment: VC's for hand placement when using RW and to scoot anteriorly for better body mechanics.  Pt's O2 decreased to <80% and pt reported feeling light headed following 30 sec of standing.  Pt requested to sit back down and PT placed pt on 1L of O2.   Pt moved to recliner side of bed and PT assisted pt in a standing pivot transfer during which pt was unsteady on feet and hesitant to fully extend knees.  He was partially able to control descent to chair but experienced difficulty in controlling the last half of the transfer.  Ambulation/Gait Ambulation/Gait assistance: (Deferred due to O2 level.)              Stairs            Wheelchair Mobility    Modified Rankin (Stroke Patients Only)       Balance Overall balance assessment: Needs  assistance Sitting-balance support: Feet supported Sitting balance-Leahy Scale: Good     Standing balance support: Bilateral upper extremity supported Standing balance-Leahy Scale: Poor                               Pertinent Vitals/Pain Pain Assessment: 0-10 Pain Score: 7  Pain Location: Abdominal pain surrounding incision site: 7/10 with movement and 5/10 at rest following movement. Pain Descriptors / Indicators: Grimacing Pain Intervention(s): Monitored during session;Limited activity within patient's tolerance    Home Living Family/patient expects to be  discharged to:: Private residence Living Arrangements: Spouse/significant other Available Help at Discharge: Family;Available PRN/intermittently(Patient's wife requires a caregiver and leaves several days/week for dialysis.  Family is not available during the day.) Type of Home: House Home Access: Level entry     Home Layout: One level Home Equipment: Walker - 2 wheels;Cane - quad;Tub bench      Prior Function Level of Independence: Independent with assistive device(s)         Comments: Uses quad cane and will use RW for longer distances when needed.  Pt's daughter reported that pt's mobility has declined in recent weeks leading up to a fall last week.     Hand Dominance        Extremity/Trunk Assessment   Upper Extremity Assessment Upper Extremity Assessment: Generalized weakness(Grossly 4-/5 bilaterally and limited by abdominal pain.  Reports no pain related to fall.)    Lower Extremity Assessment Lower Extremity Assessment: Overall WFL for tasks assessed(Grossly 4/5 bilaterally.)    Cervical / Trunk Assessment Cervical / Trunk Assessment: Kyphotic(Mildly kyphotic.)  Communication   Communication: HOH  Cognition Arousal/Alertness: Awake/alert Behavior During Therapy: WFL for tasks assessed/performed Overall Cognitive Status: Within Functional Limits for tasks assessed                                 General Comments: A&O x4; very pleasant and eager to work with PT.      General Comments      Exercises Other Exercises Other Exercises: Education regarding bed mobility and log rolling to reduce pain with supine to sit.  x5 min Other Exercises: scooting along EOB with VC's for body mechanics; able to perform supervision. x3 min   Assessment/Plan    PT Assessment Patient needs continued PT services  PT Problem List Decreased strength;Decreased mobility;Decreased balance;Decreased knowledge of use of DME;Pain;Decreased activity tolerance       PT  Treatment Interventions DME instruction;Therapeutic activities;Gait training;Therapeutic exercise;Patient/family education;Stair training;Balance training;Functional mobility training;Neuromuscular re-education    PT Goals (Current goals can be found in the Care Plan section)  Acute Rehab PT Goals Patient Stated Goal: To return to being generally active without the use of O2, if possible. PT Goal Formulation: With patient/family Time For Goal Achievement: 02/19/18 Potential to Achieve Goals: Fair    Frequency Min 2X/week   Barriers to discharge        Co-evaluation               AM-PAC PT "6 Clicks" Daily Activity  Outcome Measure Difficulty turning over in bed (including adjusting bedclothes, sheets and blankets)?: A Lot Difficulty moving from lying on back to sitting on the side of the bed? : A Lot Difficulty sitting down on and standing up from a chair with arms (e.g., wheelchair, bedside commode, etc,.)?: A Lot Help needed moving to and from a  bed to chair (including a wheelchair)?: A Lot Help needed walking in hospital room?: A Lot Help needed climbing 3-5 steps with a railing? : A Lot 6 Click Score: 12    End of Session Equipment Utilized During Treatment: Gait belt;Oxygen Activity Tolerance: Patient limited by pain;Treatment limited secondary to medical complications (Comment)(Limited by O2 desaturation.) Patient left: with chair alarm set;with call bell/phone within reach;with nursing/sitter in room;in chair;with family/visitor present Nurse Communication: Mobility status(Pt on 1L of O2 when PT left.) PT Visit Diagnosis: Unsteadiness on feet (R26.81);History of falling (Z91.81);Muscle weakness (generalized) (M62.81)    Time: 5992-3414 PT Time Calculation (min) (ACUTE ONLY): 38 min   Charges:   PT Evaluation $PT Eval Low Complexity: 1 Low PT Treatments $Therapeutic Activity: 8-22 mins        Roxanne Gates, PT, DPT   Roxanne Gates 01/29/2018, 9:48  AM

## 2018-01-29 NOTE — NC FL2 (Signed)
[place] LEVEL OF CARE SCREENING TOOL     IDENTIFICATION  Patient Name: Charles Cook Birthdate: 04/22/1930 Sex: male Admission Date (Current Location): 01/25/2018  [place] and [place] Number:  Engineering geologist and Address:  [facility], [address], [place], [place] [address]      Provider Number: [id number]  Attending Physician Name and Address:  Jules Husbands, MD  Relative Name and Phone Number:       Current Level of Care: Hospital Recommended Level of Care: [place] Prior Approval Number:    Date Approved/Denied:   PASRR Number:    Discharge Plan: SNF    Current Diagnoses: Patient Active Problem List   Diagnosis Date Noted  . Acute post-hemorrhagic anemia   . Acute cholecystitis 11/12/2017  . Cholecystitis   . Osteopenia 03/13/2017  . Sepsis ([place]) 10/07/2015  . Syncope 10/05/2015  . Congestive cardiomyopathy ([place]) 07/09/2015  . Persistent atrial fibrillation ([place]) 07/09/2015  . Gallstone 07/08/2015  . Hx of extrinsic asthma 02/06/2015  . Benign fibroma of prostate 12/16/2014  . Arteriosclerosis of coronary artery 12/16/2014  . Clinical depression 12/16/2014  . Fracture of clavicle 12/16/2014  . H/O: depression 12/16/2014  . H/O coronary artery bypass surgery 12/16/2014  . H/O malignant neoplasm of prostate 12/16/2014  . HLD (hyperlipidemia) 12/16/2014  . BP (high blood pressure) 12/16/2014  . Infection with methicillin-resistant Staphylococcus aureus 12/16/2014  . Diaphragmatic hernia 09/22/2006    Orientation RESPIRATION BLADDER Height & Weight     Self, Place, Situation, Time  Normal(1) Continent Weight: 178 lb (80.7 kg) Height:  6' (182.9 cm)  BEHAVIORAL SYMPTOMS/MOOD NEUROLOGICAL BOWEL NUTRITION STATUS  (none) (none) Continent Diet(regular)  AMBULATORY STATUS COMMUNICATION OF NEEDS Skin   Extensive Assist Verbally (closed incision)                       Personal Care  Assistance Level of Assistance  Dressing, Bathing, Feeding Bathing Assistance: Limited assistance Feeding assistance: Limited assistance Dressing Assistance: Limited assistance     Functional Limitations Info  (no issues)          SPECIAL CARE FACTORS FREQUENCY  PT (By licensed PT)                    Contractures Contractures Info: Not present    Additional Factors Info  Code Status, Allergies Code Status Info: full Allergies Info: moxifloxacin           Current Medications (01/29/2018):  This is the current hospital active medication list Current Facility-Administered Medications  Medication Dose Route Frequency Provider Last Rate Last Dose  . acetaminophen (TYLENOL) tablet 1,000 mg  1,000 mg Oral Q6H Pabon, Diego F, MD   1,000 mg at 01/29/18 1313  . amiodarone (PACERONE) tablet 200 mg  200 mg Oral Daily Pabon, [place] F, MD   200 mg at 01/29/18 0834  . digoxin (LANOXIN) tablet 0.125 mg  0.125 mg Oral Daily Pabon, [place] F, MD   0.125 mg at 01/29/18 0834  . diphenhydrAMINE (BENADRYL) capsule 25 mg  25 mg Oral QHS PRN Pabon, Diego F, MD      . fluticasone (FLONASE) 50 MCG/ACT nasal spray 2 spray  2 spray Each Nare Daily Pabon, Diego F, MD      . hydrALAZINE (APRESOLINE) injection 10 mg  10 mg Intravenous Q2H PRN Pabon, Diego F, MD      . ipratropium-albuterol (DUONEB) 0.5-2.5 (3) MG/3ML nebulizer solution  3 mL  3 mL Nebulization Q6H Loletha Grayer, MD   3 mL at 01/29/18 1142  . MEDLINE mouth rinse  15 mL Mouth Rinse BID Wilhelmina Mcardle, MD   15 mL at 01/27/18 2233  . morphine 2 MG/ML injection 2 mg  2 mg Intravenous Q3H PRN Caroleen Hamman F, MD   2 mg at 01/27/18 2230  . ondansetron (ZOFRAN) injection 4 mg  4 mg Intravenous Q6H PRN Pabon, Diego F, MD      . oxyCODONE (Oxy IR/ROXICODONE) immediate release tablet 5-10 mg  5-10 mg Oral Q4H PRN Jules Husbands, MD   5 mg at 01/28/18 2029  . pantoprazole (PROTONIX) EC tablet 40 mg  40 mg Oral Daily Pabon, [place] F, MD   40 mg at  01/29/18 0834  . piperacillin-tazobactam (ZOSYN) IVPB 3.375 g  3.375 g Intravenous Q8H Wilhelmina Mcardle, MD 12.5 mL/hr at 01/29/18 1531 3.375 g at 01/29/18 1531  . polyethylene glycol (MIRALAX / GLYCOLAX) packet 17 g  17 g Oral Daily Loletha Grayer, MD   17 g at 01/29/18 0834  . prochlorperazine (COMPAZINE) tablet 10 mg  10 mg Oral Q6H PRN Pabon, Diego F, MD       Or  . prochlorperazine (COMPAZINE) injection 5-10 mg  5-10 mg Intravenous Q6H PRN Pabon, Diego F, MD      . traZODone (DESYREL) tablet 50 mg  50 mg Oral QHS Loletha Grayer, MD   50 mg at 01/29/18 0001     Discharge Medications: Please see discharge summary for a list of discharge medications.  Relevant Imaging Results:  Relevant Lab Results:   Additional Information ss: [id number]  Shela Leff, LCSW

## 2018-01-29 NOTE — Telephone Encounter (Signed)
Pt's daughter called saying her father Charles Cook is in the [facility] and they want to give him a pneumonia vaccine.  She wants to know if he has had one here and what kind did he get.  Her call back is [phone]  Thanks Con Memos

## 2018-01-29 NOTE — Progress Notes (Signed)
Patient ID: Charles Cook, male   DOB: 1929/11/09, 84 y.o.   MRN: [id number]  [facility] PROGRESS NOTE  AIJALON KIRTZ WUJ:[id number] DOB: [birth year] DOA: 01/25/2018 PCP: Mar Daring, PA-C  HPI/Subjective: Patient has a little cough this morning a little bit audible wheeze.  Still having some abdominal pain.  Feeling weak.  States he takes MiraLAX at home.  Objective: Vitals:   01/29/18 0831 01/29/18 0930  BP: 109/80   Pulse: 78   Resp:    Temp:    SpO2: 92% 91%    Filed Weights   01/25/18 0756  Weight: 80.7 kg    ROS: Review of Systems  Constitutional: Positive for malaise/fatigue. Negative for chills and fever.  Eyes: Negative for blurred vision.  Respiratory: Positive for cough and wheezing. Negative for shortness of breath.   Cardiovascular: Negative for chest pain.  Gastrointestinal: Positive for abdominal pain. Negative for constipation, diarrhea, nausea and vomiting.  Genitourinary: Negative for dysuria.  Musculoskeletal: Negative for joint pain.  Neurological: Negative for dizziness and headaches.   Exam: Physical Exam  HENT:  Nose: No mucosal edema.  Mouth/Throat: No oropharyngeal exudate or posterior oropharyngeal edema.  Eyes: Pupils are equal, round, and reactive to light. Conjunctivae, EOM and lids are normal.  Neck: No JVD present. Carotid bruit is not present. No edema present. No thyroid mass and no thyromegaly present.  Cardiovascular: S1 normal and S2 normal. Exam reveals no gallop.  No murmur heard. Pulses:      Dorsalis pedis pulses are 2+ on the right side, and 2+ on the left side.  Respiratory: No respiratory distress. He has decreased breath sounds in the right lower field and the left lower field. He has no wheezes. He has no rhonchi. He has no rales.  GI: Soft. Bowel sounds are decreased. There is tenderness.  Musculoskeletal:       Right ankle: He exhibits no swelling.       Left ankle: He exhibits no swelling.  Lymphadenopathy:     He has no cervical adenopathy.  Neurological: He is alert. No cranial nerve deficit.  Skin: Skin is warm. No rash noted. Nails show no clubbing.  Psychiatric: He has a normal mood and affect.      Data Reviewed: Basic Metabolic Panel: Recent Labs  Lab 01/25/18 1452 01/25/18 1938 01/26/18 0559 01/27/18 0505 01/28/18 0608  NA  --  135 133* 131* 131*  K  --  5.1 5.1 4.8 4.3  CL  --  103 99 99 98  CO2  --  21* 25 27 28   GLUCOSE  --  222* 176* 119* 113*  BUN  --  17 21 23 16   CREATININE 1.10 1.27* 1.23 1.21 0.98  CALCIUM  --  8.1* 7.9* 7.5* 7.5*  MG  --   --   --  1.9  --   PHOS  --   --   --  3.0  --    Liver Function Tests: Recent Labs  Lab 01/25/18 1938 01/26/18 0559 01/27/18 0505  AST 158* 170* 92*  ALT 105* 107* 63*  ALKPHOS 61 53 51  BILITOT 0.8 0.9 0.9  PROT 5.1* 5.6* 5.0*  ALBUMIN 2.9* 3.3* 2.8*   CBC: Recent Labs  Lab 01/25/18 1938  01/26/18 0559  01/26/18 2124 01/27/18 0211 01/27/18 0505 01/27/18 0907 01/28/18 0608 01/29/18 0512  WBC 24.1*   < > 16.0*  --  13.5*  --  10.7*  --  9.1 8.4  NEUTROABS 21.9*  --   --   --   --   --  9.1*  --   --  6.4  HGB 9.3*   < > 7.8*   < > 8.5* 8.4* 8.0* 8.7* 8.0* 9.0*  HCT 27.6*   < > 22.7*   < > 24.2* 24.7* 22.9* 25.1* 22.5* 25.8*  MCV 100.6*   < > 98.5  --  90.6  --  88.9  --  90.0 89.6  PLT 237   < > 178  --  158  --  150  --  168 190   < > = values in this interval not displayed.   Cardiac Enzymes: Recent Labs  Lab 01/25/18 1938 01/26/18 0559  TROPONINI <0.03 0.03*    CBG: Recent Labs  Lab 01/27/18 1154 01/27/18 1550 01/28/18 0801 01/28/18 1657 01/29/18 0750  GLUCAP 107* 98 92 119* 82    Recent Results (from the past 240 hour(s))  MRSA PCR Screening     Status: None   Collection Time: 01/25/18  7:40 PM  Result Value Ref Range Status   MRSA by PCR NEGATIVE NEGATIVE Final    Comment:        The GeneXpert MRSA Assay (FDA approved for NASAL specimens only), is one component of  a comprehensive MRSA colonization surveillance program. It is not intended to diagnose MRSA infection nor to guide or monitor treatment for MRSA infections. Performed at [facility], [address] [place], [place] [address]   CULTURE, BLOOD (ROUTINE X 2) w Reflex to ID Panel     Status: None (Preliminary result)   Collection Time: 01/26/18  6:02 AM  Result Value Ref Range Status   Specimen Description BLOOD RAC  Final   Special Requests   Final    BOTTLES DRAWN AEROBIC AND ANAEROBIC Blood Culture adequate volume   Culture   Final    NO GROWTH 2 DAYS Performed at [facility], [address], [place], [place] [address]    Report Status PENDING  Incomplete  CULTURE, BLOOD (ROUTINE X 2) w Reflex to ID Panel     Status: None (Preliminary result)   Collection Time: 01/26/18  6:02 AM  Result Value Ref Range Status   Specimen Description BLOOD RFA  Final   Special Requests   Final    BOTTLES DRAWN AEROBIC AND ANAEROBIC Blood Culture adequate volume   Culture   Final    NO GROWTH 2 DAYS Performed at [facility], [address] [place], [place] [address]    Report Status PENDING  Incomplete     Scheduled Meds: . acetaminophen  1,000 mg Oral Q6H  . amiodarone  200 mg Oral Daily  . digoxin  0.125 mg Oral Daily  . fluticasone  2 spray Each Nare Daily  . ipratropium-albuterol  3 mL Nebulization Q6H  . mouth rinse  15 mL Mouth Rinse BID  . pantoprazole  40 mg Oral Daily  . pneumococcal 23 valent vaccine  0.5 mL Intramuscular Tomorrow-1000  . polyethylene glycol  17 g Oral Daily  . traZODone  50 mg Oral QHS   Continuous Infusions: . piperacillin-tazobactam (ZOSYN)  IV 3.375 g (01/29/18 4128)    Assessment/Plan:  1. Acute delirium.  Seems a little bit better today.  Trazodone at night to try to get sleeping.   2. Possible aspiration pneumonia Zosyn would cover.  Nebulizer treatments. 3. Orthostatic hypotension and volume loss.  Patient had  unresponsive syncopal events.  Last hemoglobin 9.0.  Patient status post transfusion on Friday. 4. Acute cholecystitis status post 2 surgical  procedures.  One procedure for gallbladder removal and another procedure for postoperative bleeding.  Pain control.  Patient on solid food today.  Empiric antibiotics with Zosyn. 5. History of atrial fibrillation on amiodarone and digoxin. 6. Acute blood loss anemia.  Status post 1 unit of packed red blood cells Friday 7. History of bladder cancer, history of prostate cancer 8. Weakness.  Physical therapy recommends rehab but family would prefer to take him home.  Code Status:     Code Status Orders  (From admission, onward)         Start     Ordered   01/25/18 1429  Full code  Continuous     01/25/18 1428        Code Status History    Date Active Date Inactive Code Status Order ID Comments User Context   11/12/2017 1450 11/15/2017 2141 Full Code [id number]  Epifanio Lesches, MD ED   10/05/2015 2157 10/09/2015 1834 Full Code [id number]  Gladstone Lighter, MD ED     Family Communication: Daughter yesterday Disposition Plan: Family hoping to take patient home and avoiding rehab if possible.  Consultants:  General surgery  Procedures:  Cholecystectomy  Postoperative bleeding procedure  Antibiotics:  Zosyn  Time spent: 26 minutes  [place]

## 2018-01-29 NOTE — Progress Notes (Signed)
PT Cancellation Note  Patient Details Name: Charles Cook MRN: [id number] DOB: 1930-01-30   Cancelled Treatment:    Reason Eval/Treat Not Completed: Patient at procedure or test/unavailable.  Order received.  Chart reviewed.  Nursing and PA in pt room currently.  Will re-attempt when pt is available.   Roxanne Gates, PT, DPT 01/29/2018, 8:39 AM

## 2018-01-30 LAB — TYPE AND SCREEN
ABO/RH(D): A POS
Antibody Screen: NEGATIVE
Unit division: 0
Unit division: 0
Unit division: 0
Unit division: 0

## 2018-01-30 LAB — BPAM RBC
Blood Product Expiration Date: 201910222359
Blood Product Expiration Date: 201910222359
Blood Product Expiration Date: 201910262359
Blood Product Expiration Date: 201910272359
ISSUE DATE / TIME: 201909271722
Unit Type and Rh: 600
Unit Type and Rh: 600
Unit Type and Rh: 6200
Unit Type and Rh: 6200

## 2018-01-30 LAB — CBC WITH DIFFERENTIAL/PLATELET
Basophils Absolute: 0.1 10*3/uL (ref 0–0.1)
Basophils Relative: 1 %
Eosinophils Absolute: 0.3 10*3/uL (ref 0–0.7)
Eosinophils Relative: 4 %
HCT: 26.5 % — ABNORMAL LOW (ref 40.0–52.0)
Hemoglobin: 9.4 g/dL — ABNORMAL LOW (ref 13.0–18.0)
Lymphocytes Relative: 6 %
Lymphs Abs: 0.6 10*3/uL — ABNORMAL LOW (ref 1.0–3.6)
MCH: 31.8 pg (ref 26.0–34.0)
MCHC: 35.4 g/dL (ref 32.0–36.0)
MCV: 89.8 fL (ref 80.0–100.0)
Monocytes Absolute: 0.9 10*3/uL (ref 0.2–1.0)
Monocytes Relative: 11 %
Neutro Abs: 6.9 10*3/uL — ABNORMAL HIGH (ref 1.4–6.5)
Neutrophils Relative %: 78 %
Platelets: 237 10*3/uL (ref 150–440)
RBC: 2.95 MIL/uL — ABNORMAL LOW (ref 4.40–5.90)
RDW: 21.9 % — ABNORMAL HIGH (ref 11.5–14.5)
WBC: 8.8 10*3/uL (ref 3.8–10.6)

## 2018-01-30 LAB — GLUCOSE, CAPILLARY
Glucose-Capillary: 108 mg/dL — ABNORMAL HIGH (ref 70–99)
Glucose-Capillary: 120 mg/dL — ABNORMAL HIGH (ref 70–99)

## 2018-01-30 MED ORDER — ACETAMINOPHEN 500 MG PO TABS: 1000.0000 mg | ORAL_TABLET | Freq: Four times a day (QID) | ORAL | Status: DC | PRN

## 2018-01-30 MED ORDER — GUAIFENESIN ER 600 MG PO TB12
600.0000 mg | ORAL_TABLET | Freq: Two times a day (BID) | ORAL | Status: DC | PRN
  Administered 2018-01-30: 600 mg via ORAL
  Filled 2018-01-30: qty 1

## 2018-01-30 MED ORDER — AMOXICILLIN-POT CLAVULANATE 875-125 MG PO TABS
1.0000 | ORAL_TABLET | Freq: Two times a day (BID) | ORAL | Status: DC
  Administered 2018-01-31: 1 via ORAL
  Filled 2018-01-30: qty 1

## 2018-01-30 MED ORDER — MENTHOL 3 MG MT LOZG
1.0000 | LOZENGE | OROMUCOSAL | Status: DC | PRN
  Filled 2018-01-30: qty 9

## 2018-01-30 MED ORDER — SERTRALINE HCL 50 MG PO TABS
50.0000 mg | ORAL_TABLET | Freq: Every day | ORAL | Status: DC
  Administered 2018-01-31: 50 mg via ORAL
  Filled 2018-01-30: qty 1

## 2018-01-30 MED ORDER — FUROSEMIDE 40 MG PO TABS
40.0000 mg | ORAL_TABLET | Freq: Once | ORAL | Status: AC
  Administered 2018-01-30: 40 mg via ORAL
  Filled 2018-01-30: qty 1

## 2018-01-30 NOTE — Clinical Social Work Note (Signed)
Clinical Social Work Assessment  Patient Details  Name: Charles Cook MRN: [id number] Date of Birth: 12/16/1929  Date of referral:  01/30/18               Reason for consult:  Facility Placement, Discharge Planning                Permission sought to share information with:    Permission granted to share information::     Name::        Agency::     Relationship::     Contact Information:     Housing/Transportation Living arrangements for the past 2 months:  Single Family Home Source of Information:  Adult Children Patient Interpreter Needed:  None Criminal Activity/Legal Involvement Pertinent to Current Situation/Hospitalization:  No - Comment as needed Significant Relationships:  Adult Children, Spouse Lives with:  Spouse Do you feel safe going back to the place where you live?  Yes Need for family participation in patient care:  Yes (Comment)  Care giving concerns:  Patient has been residing at home with his wife.   Social Worker assessment / plan:  CSW has been informed by the RN CM that patient and daughter are now wanting to pursue short term rehab and that they prefer [facility]. CSW conducted a bed search and [facility] offered. CSW will facilitate discharge when time. CSW spoke with patient's daughter and she is in agreement with the above plan.  Employment status:    Insurance information:    PT Recommendations:  [place] / Referral to community resources:     Patient/Family's Response to care:  Patient's daughter expressed appreciation for CSW assistance.  Patient/Family's Understanding of and Emotional Response to Diagnosis, Current Treatment, and Prognosis:  Patient's daughter is hoping that patient improves with rehab and can return home.  Emotional Assessment Appearance:  Appears stated age Attitude/Demeanor/Rapport:    Affect (typically observed):  Accepting Orientation:  Oriented to Self, Oriented to Place, Oriented to  Situation Alcohol / Substance use:  Not Applicable Psych involvement (Current and /or in the community):  No (Comment)  Discharge Needs  Concerns to be addressed:  Care Coordination Readmission within the last 30 days:  No Current discharge risk:  None Barriers to Discharge:  No Barriers Identified   Shela Leff, LCSW 01/30/2018, 12:19 PM

## 2018-01-30 NOTE — Progress Notes (Signed)
[facility] Progress Note  5 Days Post-Op  Subjective: He is feeling better this morning. He denied any abdominal pain, nausea, or emesis. He has been tolerating a diet. Had a BM over night. Was up with PT yesterday who recommended SNF. Family is discussing this with CSW but leaning towards rehab.   Objective: Vital signs in last 24 hours: Temp:  [97.6 F (36.4 C)-98.5 F (36.9 C)] 98.5 F (36.9 C) (10/01 0502) Pulse Rate:  [73-89] 82 (10/01 0739) Resp:  [16-20] 16 (10/01 0739) BP: (112-166)/(63-76) 140/63 (10/01 0502) SpO2:  [92 %-97 %] 94 % (10/01 0739) Last BM Date: 01/30/18  Intake/Output from previous day: 09/30 0701 - 10/01 0700 In: 643.4 [P.O.:480; IV Piggyback:163.4] Out: 860 [Urine:675; Drains:185] Intake/Output this shift: No intake/output data recorded.  PE: Gen:  Alert, NAD, pleasant Pulm:  Normal effort Abd: Soft, expected incisional tenderness, non-distended, bowel midline laparotomy incision is well healing, CDI, no erythema or drainage, JP in RUQ with serous to questionable bile tinged fluid in bulb Skin: warm and dry, no rashes  Psych: A&Ox3   Lab Results:  Recent Labs    01/29/18 0512 01/30/18 0346  WBC 8.4 8.8  HGB 9.0* 9.4*  HCT 25.8* 26.5*  PLT 190 237   BMET Recent Labs    01/28/18 0608  NA 131*  K 4.3  CL 98  CO2 28  GLUCOSE 113*  BUN 16  CREATININE 0.98  CALCIUM 7.5*   PT/INR No results for input(s): LABPROT, INR in the last 72 hours. CMP     Component Value Date/Time   NA 131 (L) 01/28/2018 0608   NA 136 11/21/2017 1145   K 4.3 01/28/2018 0608   CL 98 01/28/2018 0608   CO2 28 01/28/2018 0608   GLUCOSE 113 (H) 01/28/2018 0608   BUN 16 01/28/2018 0608   BUN 10 11/21/2017 1145   CREATININE 0.98 01/28/2018 0608   CALCIUM 7.5 (L) 01/28/2018 0608   PROT 5.0 (L) 01/27/2018 0505   PROT 5.4 (L) 11/21/2017 1145   ALBUMIN 2.8 (L) 01/27/2018 0505   ALBUMIN 3.5 11/21/2017 1145   AST 92 (H) 01/27/2018 0505   ALT  63 (H) 01/27/2018 0505   ALKPHOS 51 01/27/2018 0505   BILITOT 0.9 01/27/2018 0505   BILITOT 0.3 11/21/2017 1145   GFRNONAA >60 01/28/2018 0608   GFRAA >60 01/28/2018 0608   Lipase     Component Value Date/Time   LIPASE 23 11/12/2017 0855       Studies/Results: Dg Chest Port 1 View  Result Date: 01/29/2018 CLINICAL DATA:  84 year old male with wheezing.  Weakness. EXAM: PORTABLE CHEST 1 VIEW COMPARISON:  01/19/2018 and earlier. FINDINGS: Portable AP upright view at 0920 hours. Lower lung volumes with multifocal new pulmonary opacity, most confluent at the left lung base. A right upper quadrant pigtail drain has been removed since the prior images. No pneumothorax or pulmonary edema. No definite pleural effusion. Stable cardiac size and mediastinal contours. Prior CABG. Stable cardiac size and mediastinal contours. Negative visible bowel gas pattern. Chronic right lateral rib fractures. IMPRESSION: 1. New left lung base and right hilar airspace opacity suspicious for aspiration and/or pneumonia. No definite pleural effusion. 2. Cholecystostomy tube has been removed since 01/19/2018. Electronically Signed   By: Genevie Ann M.D.   On: 01/29/2018 09:34    Anti-infectives: Anti-infectives (From admission, onward)   Start     Dose/Rate Route Frequency Ordered Stop   01/26/18 1000  vancomycin (VANCOCIN) IVPB 750 mg/150 ml premix  Status:  Discontinued     750 mg 150 mL/hr over 60 Minutes Intravenous Every 12 hours 01/26/18 0533 01/26/18 1039   01/26/18 0600  piperacillin-tazobactam (ZOSYN) IVPB 3.375 g     3.375 g 12.5 mL/hr over 240 Minutes Intravenous Every 8 hours 01/26/18 0241 01/31/18 0559   01/26/18 0245  vancomycin (VANCOCIN) IVPB 1000 mg/200 mL premix     1,000 mg 200 mL/hr over 60 Minutes Intravenous  Once 01/26/18 0241 01/26/18 0505   01/25/18 2345  ceFAZolin (ANCEF) IVPB 2g/100 mL premix     2 g 200 mL/hr over 30 Minutes Intravenous On call to O.R. 01/25/18 2336 01/26/18 0051    01/25/18 0803  ceFAZolin (ANCEF) 2-4 GM/100ML-% IVPB    Note to Pharmacy:  Phineas Real   : cabinet override      01/25/18 0803 01/25/18 2029   01/25/18 0600  ceFAZolin (ANCEF) IVPB 2g/100 mL premix     2 g 200 mL/hr over 30 Minutes Intravenous On call to O.R. 01/24/18 2207 01/25/18 0917       Assessment/Plan  Cholecystitis with subsequent exploratory laparotomy for bleeding - POD5, improvement in abdominal pain and mentation this morning, VSS, tolerating a diet - Hgb to 9.4 this morning from 9.0, last transfusion 09/27, this has been stable for 3 days, will stop checking unless acute issues arise - No leukocytosis - Tolerating full diet, had BM yesterday - JP with 185 ccs in last 24 hours per chart review, appears serous, was some concern for bili leak however T. Bili normalized. JP will remain as output too high.  - Continue IV ABx - Pain control as needed - Encouraged IS use, pulmonary toilet - PT to evaluated and recommended SNF, patient and daughter discussing this, but they are leaning towards rehab - Hospitalist following, CXR concerning for aspiration, continue Zosyn, would need Augmentin at discharge (for 5 days from now)    LOS: 5 days    Edison Simon , PA-C [facility] 01/30/2018, 9:31 AM [phone] M-F: 7am - 4pm

## 2018-01-30 NOTE — Progress Notes (Signed)
Patient ID: Charles Cook, male   DOB: 05-30-1929, 84 y.o.   MRN: [id number]  [employer] PROGRESS NOTE  Charles Cook OHY:[id number] DOB: 08/26/1929 DOA: 01/25/2018 PCP: Mar Daring, PA-C  HPI/Subjective: [name]  Family and patient thinks his mental status is better.  Still feeling weak.  Tolerating diet.  Has had a bowel movement.  Objective: Vitals:   01/30/18 0739 01/30/18 1314  BP:  (!) 155/64  Pulse: 82 67  Resp: 16 16  Temp:  98.2 F (36.8 C)  SpO2: 94% (!) 89%    Filed Weights   01/25/18 0756  Weight: 80.7 kg    ROS: Review of Systems  Constitutional: Positive for malaise/fatigue. Negative for chills and fever.  Eyes: Negative for blurred vision.  Respiratory: Positive for cough. Negative for shortness of breath.   Cardiovascular: Negative for chest pain.  Gastrointestinal: Positive for abdominal pain. Negative for constipation, diarrhea, nausea and vomiting.  Genitourinary: Negative for dysuria.  Musculoskeletal: Negative for joint pain.  Neurological: Negative for dizziness and headaches.   Exam: Physical Exam  HENT:  Nose: No mucosal edema.  Mouth/Throat: No oropharyngeal exudate or posterior oropharyngeal edema.  Eyes: Pupils are equal, round, and reactive to light. Conjunctivae, EOM and lids are normal.  Neck: No JVD present. Carotid bruit is not present. No edema present. No thyroid mass and no thyromegaly present.  Cardiovascular: S1 normal and S2 normal. Exam reveals no gallop.  No murmur heard. Pulses:      Dorsalis pedis pulses are 2+ on the right side, and 2+ on the left side.  Respiratory: No respiratory distress. He has decreased breath sounds in the right lower field and the left lower field. He has no wheezes. He has no rhonchi. He has no rales.  GI: Soft. Bowel sounds are decreased. There is tenderness.  Musculoskeletal:       Right ankle: He  exhibits swelling.       Left ankle: He exhibits swelling.  Lymphadenopathy:    He has no cervical adenopathy.  Neurological: He is alert. No cranial nerve deficit.  Skin: Skin is warm. No rash noted. Nails show no clubbing.  Psychiatric: He has a normal mood and affect.      Data Reviewed: Basic Metabolic Panel: Recent Labs  Lab 01/25/18 1452 01/25/18 1938 01/26/18 0559 01/27/18 0505 01/28/18 0608  NA  --  135 133* 131* 131*  K  --  5.1 5.1 4.8 4.3  CL  --  103 99 99 98  CO2  --  21* 25 27 28   GLUCOSE  --  222* 176* 119* 113*  BUN  --  17 21 23 16   CREATININE 1.10 1.27* 1.23 1.21 0.98  CALCIUM  --  8.1* 7.9* 7.5* 7.5*  MG  --   --   --  1.9  --   PHOS  --   --   --  3.0  --    Liver Function Tests: Recent Labs  Lab 01/25/18 1938 01/26/18 0559 01/27/18 0505  AST 158* 170* 92*  ALT 105* 107* 63*  ALKPHOS 61 53 51  BILITOT 0.8 0.9 0.9  PROT 5.1* 5.6* 5.0*  ALBUMIN 2.9* 3.3* 2.8*   CBC: Recent Labs  Lab 01/25/18 1938  01/26/18 2124  01/27/18 0505 01/27/18 0907 01/28/18 0608 01/29/18 0512 01/30/18 0346  WBC 24.1*   < > 13.5*  --  10.7*  --  9.1 8.4 8.8  NEUTROABS 21.9*  --   --   --  9.1*  --   --  6.4 6.9*  HGB 9.3*   < > 8.5*   < > 8.0* 8.7* 8.0* 9.0* 9.4*  HCT 27.6*   < > 24.2*   < > 22.9* 25.1* 22.5* 25.8* 26.5*  MCV 100.6*   < > 90.6  --  88.9  --  90.0 89.6 89.8  PLT 237   < > 158  --  150  --  168 190 237   < > = values in this interval not displayed.   Cardiac Enzymes: Recent Labs  Lab 01/25/18 1938 01/26/18 0559  TROPONINI <0.03 0.03*    CBG: Recent Labs  Lab 01/28/18 1657 01/29/18 0750 01/29/18 1602 01/29/18 2355 01/30/18 0749  GLUCAP 119* 82 109* 104* 108*    Recent Results (from the past 240 hour(s))  MRSA PCR Screening     Status: None   Collection Time: 01/25/18  7:40 PM  Result Value Ref Range Status   MRSA by PCR NEGATIVE NEGATIVE Final    Comment:        The GeneXpert MRSA Assay (FDA approved for NASAL  specimens only), is one component of a comprehensive MRSA colonization surveillance program. It is not intended to diagnose MRSA infection nor to guide or monitor treatment for MRSA infections. Performed at [facility], [address] [place], [place] [address]   CULTURE, BLOOD (ROUTINE X 2) w Reflex to ID Panel     Status: None (Preliminary result)   Collection Time: 01/26/18  6:02 AM  Result Value Ref Range Status   Specimen Description BLOOD RAC  Final   Special Requests   Final    BOTTLES DRAWN AEROBIC AND ANAEROBIC Blood Culture adequate volume   Culture   Final    NO GROWTH 4 DAYS Performed at [facility], [address] [place], [place] [address]    Report Status PENDING  Incomplete  CULTURE, BLOOD (ROUTINE X 2) w Reflex to ID Panel     Status: None (Preliminary result)   Collection Time: 01/26/18  6:02 AM  Result Value Ref Range Status   Specimen Description BLOOD RFA  Final   Special Requests   Final    BOTTLES DRAWN AEROBIC AND ANAEROBIC Blood Culture adequate volume   Culture   Final    NO GROWTH 4 DAYS Performed at [facility], [address] [place], [place] [address]    Report Status PENDING  Incomplete     Scheduled Meds: . acetaminophen  1,000 mg Oral Q6H  . amiodarone  200 mg Oral Daily  . digoxin  0.125 mg Oral Daily  . fluticasone  2 spray Each Nare Daily  . ipratropium-albuterol  3 mL Nebulization TID  . mouth rinse  15 mL Mouth Rinse BID  . pantoprazole  40 mg Oral Daily  . polyethylene glycol  17 g Oral Daily  . traZODone  50 mg Oral QHS   Continuous Infusions: . piperacillin-tazobactam (ZOSYN)  IV 3.375 g (01/30/18 0505)    Assessment/Plan:  1. Possible aspiration pneumonia Zosyn would cover.  Nebulizer treatments.  Would continue antibiotics for 5 more days. 2. Acute delirium has cleared.  Continue trazodone at night 3. Orthostatic hypotension and volume loss.  Patient had unresponsive syncopal events.   Last hemoglobin 9.4.  Patient status post transfusion on Friday. 4. Acute cholecystitis status post 2 surgical procedures.  One procedure for gallbladder  removal and another procedure for postoperative bleeding.  Pain control.  Patient on solid food and had bowel movement 5. History of atrial fibrillation on amiodarone and digoxin. 6. Acute blood loss anemia.  Status post 1 unit of packed red blood cells Friday 7. History of bladder cancer, history of prostate cancer 8. Weakness.  Physical therapy recommends rehab and family today has agreed to that.  Code Status:     Code Status Orders  (From admission, onward)         Start     Ordered   01/25/18 1429  Full code  Continuous     01/25/18 1428        Code Status History    Date Active Date Inactive Code Status Order ID Comments User Context   11/12/2017 1450 11/15/2017 2141 Full Code [id number]  Epifanio Lesches, MD ED   10/05/2015 2157 10/09/2015 1834 Full Code [id number]  Gladstone Lighter, MD ED     Family Communication: Daughter today Disposition Plan: Family now interested in rehab  Consultants:  General surgery  Procedures:  Cholecystectomy  Postoperative bleeding procedure  Antibiotics:  Zosyn  Time spent: 65 minutes  [place]

## 2018-01-30 NOTE — Progress Notes (Signed)
Pharmacy Antibiotic Note  Charles Cook is a 84 y.o. male admitted on 01/25/2018 to ICU s/p Code Blue for syncope. Patient had laparoscopic cholecystectomy on 9/26.  Pharmacy has been consulted for vancomycin and Zosyn dosing for possible intraabdominal pathogens.   Vancomycin discontinued 9/27  Plan: Continue Zosyn EI 3.375g IV Q8hr.  Stop date entered as 10/2 (after 5 days)  Height: 6' (182.9 cm) Weight: 178 lb (80.7 kg) IBW/kg (Calculated) : 77.6  Temp (24hrs), Avg:98.1 F (36.7 C), Min:97.6 F (36.4 C), Max:98.5 F (36.9 C)  Recent Labs  Lab 01/25/18 1452 01/25/18 1938 01/25/18 2051 01/25/18 2336  01/26/18 0559 01/26/18 0622 01/26/18 0939 01/26/18 1203 01/26/18 2124 01/27/18 0505 01/28/18 0608 01/29/18 0512 01/30/18 0346  WBC 18.7* 24.1*  --   --    < > 16.0*  --   --   --  13.5* 10.7* 9.1 8.4 8.8  CREATININE 1.10 1.27*  --   --   --  1.23  --   --   --   --  1.21 0.98  --   --   LATICACIDVEN  --   --  5.3* 5.2*  --   --  3.0* 3.4* 2.1*  --   --   --   --   --    < > = values in this interval not displayed.    Estimated Creatinine Clearance: 59.4 mL/min (by C-G formula based on SCr of 0.98 mg/dL).    Allergies  Allergen Reactions  . Moxifloxacin Hcl In Nacl Other (See Comments)    Confusion     Antimicrobials this admission: Cefazolin 9/26 >> 9/27 Vancomycin 9/27 x 1  Zosyn 9/27 >>   Dose adjustments this admission: N/A  Microbiology results: 9/27 BCx: NGTD 9/27 MRSA PCR: negative   Thank you for allowing pharmacy to be a part of this patient's care.  Rocky Morel, PharmD, BCPS Clinical Pharmacist 01/30/2018 10:01 AM

## 2018-01-31 DIAGNOSIS — D519 Vitamin B12 deficiency anemia, unspecified: Secondary | ICD-10-CM | POA: Diagnosis not present

## 2018-01-31 DIAGNOSIS — M810 Age-related osteoporosis without current pathological fracture: Secondary | ICD-10-CM | POA: Diagnosis not present

## 2018-01-31 DIAGNOSIS — R600 Localized edema: Secondary | ICD-10-CM | POA: Diagnosis not present

## 2018-01-31 DIAGNOSIS — J302 Other seasonal allergic rhinitis: Secondary | ICD-10-CM | POA: Diagnosis not present

## 2018-01-31 DIAGNOSIS — Z8546 Personal history of malignant neoplasm of prostate: Secondary | ICD-10-CM | POA: Diagnosis not present

## 2018-01-31 DIAGNOSIS — J189 Pneumonia, unspecified organism: Secondary | ICD-10-CM | POA: Diagnosis not present

## 2018-01-31 DIAGNOSIS — I951 Orthostatic hypotension: Secondary | ICD-10-CM | POA: Diagnosis not present

## 2018-01-31 DIAGNOSIS — M6281 Muscle weakness (generalized): Secondary | ICD-10-CM | POA: Diagnosis not present

## 2018-01-31 DIAGNOSIS — I10 Essential (primary) hypertension: Secondary | ICD-10-CM | POA: Diagnosis not present

## 2018-01-31 DIAGNOSIS — D62 Acute posthemorrhagic anemia: Secondary | ICD-10-CM | POA: Diagnosis not present

## 2018-01-31 DIAGNOSIS — R410 Disorientation, unspecified: Secondary | ICD-10-CM | POA: Diagnosis not present

## 2018-01-31 DIAGNOSIS — F3289 Other specified depressive episodes: Secondary | ICD-10-CM | POA: Diagnosis not present

## 2018-01-31 DIAGNOSIS — G44229 Chronic tension-type headache, not intractable: Secondary | ICD-10-CM | POA: Diagnosis not present

## 2018-01-31 DIAGNOSIS — I4891 Unspecified atrial fibrillation: Secondary | ICD-10-CM | POA: Diagnosis not present

## 2018-01-31 DIAGNOSIS — F418 Other specified anxiety disorders: Secondary | ICD-10-CM | POA: Diagnosis not present

## 2018-01-31 DIAGNOSIS — K810 Acute cholecystitis: Secondary | ICD-10-CM | POA: Diagnosis not present

## 2018-01-31 DIAGNOSIS — J690 Pneumonitis due to inhalation of food and vomit: Secondary | ICD-10-CM | POA: Diagnosis not present

## 2018-01-31 DIAGNOSIS — Z9049 Acquired absence of other specified parts of digestive tract: Secondary | ICD-10-CM | POA: Diagnosis not present

## 2018-01-31 DIAGNOSIS — Z23 Encounter for immunization: Secondary | ICD-10-CM | POA: Diagnosis not present

## 2018-01-31 LAB — BASIC METABOLIC PANEL
Anion gap: 10 (ref 5–15)
BUN: 12 mg/dL (ref 8–23)
CO2: 25 mmol/L (ref 22–32)
Calcium: 7.3 mg/dL — ABNORMAL LOW (ref 8.9–10.3)
Chloride: 99 mmol/L (ref 98–111)
Creatinine, Ser: 0.85 mg/dL (ref 0.61–1.24)
GFR calc Af Amer: >60 mL/min (ref >60)
GFR calc non Af Amer: >60 mL/min (ref >60)
Glucose, Bld: 109 mg/dL — ABNORMAL HIGH (ref 70–99)
Potassium: 3.5 mmol/L (ref 3.5–5.1)
Sodium: 134 mmol/L — ABNORMAL LOW (ref 135–145)

## 2018-01-31 LAB — CULTURE, BLOOD (ROUTINE X 2)
Culture: NO GROWTH
Culture: NO GROWTH
Special Requests: ADEQUATE
Special Requests: ADEQUATE

## 2018-01-31 LAB — CBC WITH DIFFERENTIAL/PLATELET
Basophils Absolute: 0 10*3/uL (ref 0–0.1)
Basophils Relative: 0 %
Eosinophils Absolute: 0.5 10*3/uL (ref 0–0.7)
Eosinophils Relative: 6 %
HCT: 25.4 % — ABNORMAL LOW (ref 40.0–52.0)
Hemoglobin: 9 g/dL — ABNORMAL LOW (ref 13.0–18.0)
Lymphocytes Relative: 10 %
Lymphs Abs: 0.8 10*3/uL — ABNORMAL LOW (ref 1.0–3.6)
MCH: 32 pg (ref 26.0–34.0)
MCHC: 35.6 g/dL (ref 32.0–36.0)
MCV: 90.1 fL (ref 80.0–100.0)
Monocytes Absolute: 1.4 10*3/uL — ABNORMAL HIGH (ref 0.2–1.0)
Monocytes Relative: 16 %
Neutro Abs: 5.9 10*3/uL (ref 1.4–6.5)
Neutrophils Relative %: 68 %
Platelets: 261 10*3/uL (ref 150–440)
RBC: 2.82 MIL/uL — ABNORMAL LOW (ref 4.40–5.90)
RDW: 21.9 % — ABNORMAL HIGH (ref 11.5–14.5)
WBC: 8.7 10*3/uL (ref 3.8–10.6)

## 2018-01-31 LAB — GLUCOSE, CAPILLARY
Glucose-Capillary: 105 mg/dL — ABNORMAL HIGH (ref 70–99)
Glucose-Capillary: 98 mg/dL (ref 70–99)

## 2018-01-31 MED ORDER — POTASSIUM CHLORIDE CRYS ER 20 MEQ PO TBCR
40.0000 meq | EXTENDED_RELEASE_TABLET | Freq: Once | ORAL | Status: AC
  Administered 2018-01-31: 40 meq via ORAL
  Filled 2018-01-31: qty 2

## 2018-01-31 MED ORDER — OXYCODONE HCL 5 MG PO TABS: 5.0000 mg | ORAL_TABLET | Freq: Four times a day (QID) | ORAL | 0 refills | Status: DC | PRN

## 2018-01-31 MED ORDER — AMOXICILLIN-POT CLAVULANATE 875-125 MG PO TABS: 1.0000 | ORAL_TABLET | Freq: Two times a day (BID) | ORAL | 0 refills | Status: AC

## 2018-01-31 NOTE — Discharge Instructions (Signed)
Wound Care  Okay to shower, do not submerge wounds in water  Dressing change over JP drain every other day  Empty JP drain 3x a day, please measure and record amount draining every day  Okay to leave midline incision without dressing  Monitor for bleeding, increased drainage, fever, redness  Take Augmentin twice a day for 4 days (prescription provided) Okay to start Eliquis tomorrow 10/03

## 2018-01-31 NOTE — Progress Notes (Signed)
Patient ID: Charles Cook, male   DOB: 01-12-1930, 84 y.o.   MRN: [id number]   [employer] PROGRESS NOTE  Charles Cook [name] DOB: Sep 16, 1929 DOA: 01/25/2018 PCP: Mar Daring, PA-C  HPI/Subjective: Patient feeling better.  Still having some abdominal pain but not much.  Objective: Vitals:   01/31/18 0500 01/31/18 0722  BP: (!) 163/63   Pulse: 93   Resp: 20   Temp: 98.6 F (37 C)   SpO2: 94% 92%    Filed Weights   01/25/18 0756  Weight: 80.7 kg    ROS: Review of Systems  Constitutional: Negative for chills, fever and malaise/fatigue.  Eyes: Negative for blurred vision.  Respiratory: Negative for cough and shortness of breath.   Cardiovascular: Negative for chest pain.  Gastrointestinal: Positive for abdominal pain. Negative for diarrhea, nausea and vomiting.  Genitourinary: Negative for dysuria.  Musculoskeletal: Negative for joint pain.  Neurological: Negative for dizziness and headaches.   Exam: Physical Exam  HENT:  Nose: No mucosal edema.  Mouth/Throat: No oropharyngeal exudate or posterior oropharyngeal edema.  Eyes: Pupils are equal, round, and reactive to light. Conjunctivae, EOM and lids are normal.  Neck: No JVD present. Carotid bruit is not present. No edema present. No thyroid mass and no thyromegaly present.  Cardiovascular: S1 normal and S2 normal. Exam reveals no gallop.  No murmur heard. Pulses:      Dorsalis pedis pulses are 2+ on the right side, and 2+ on the left side.  Respiratory: No respiratory distress. He has no decreased breath sounds. He has no wheezes. He has no rhonchi. He has no rales.  GI: Soft. Bowel sounds are decreased. There is tenderness.  Musculoskeletal:       Right ankle: He exhibits swelling.       Left ankle: He exhibits swelling.  Lymphadenopathy:    He has no cervical adenopathy.  Neurological: He is alert. No cranial nerve deficit.  Skin: Skin is warm. No rash noted. Nails show no clubbing.   Psychiatric: He has a normal mood and affect.      Data Reviewed: Basic Metabolic Panel: Recent Labs  Lab 01/25/18 1938 01/26/18 0559 01/27/18 0505 01/28/18 0608 01/31/18 0506  NA 135 133* 131* 131* 134*  K 5.1 5.1 4.8 4.3 3.5  CL 103 99 99 98 99  CO2 21* 25 27 28 25   GLUCOSE 222* 176* 119* 113* 109*  BUN 17 21 23 16 12   CREATININE 1.27* 1.23 1.21 0.98 0.85  CALCIUM 8.1* 7.9* 7.5* 7.5* 7.3*  MG  --   --  1.9  --   --   PHOS  --   --  3.0  --   --    Liver Function Tests: Recent Labs  Lab 01/25/18 1938 01/26/18 0559 01/27/18 0505  AST 158* 170* 92*  ALT 105* 107* 63*  ALKPHOS 61 53 51  BILITOT 0.8 0.9 0.9  PROT 5.1* 5.6* 5.0*  ALBUMIN 2.9* 3.3* 2.8*   CBC: Recent Labs  Lab 01/25/18 1938  01/27/18 0505 01/27/18 0907 01/28/18 0608 01/29/18 0512 01/30/18 0346 01/31/18 0506  WBC 24.1*   < > 10.7*  --  9.1 8.4 8.8 8.7  NEUTROABS 21.9*  --  9.1*  --   --  6.4 6.9* 5.9  HGB 9.3*   < > 8.0* 8.7* 8.0* 9.0* 9.4* 9.0*  HCT 27.6*   < > 22.9* 25.1* 22.5* 25.8* 26.5* 25.4*  MCV 100.6*   < > 88.9  --  90.0  89.6 89.8 90.1  PLT 237   < > 150  --  168 190 237 261   < > = values in this interval not displayed.   Cardiac Enzymes: Recent Labs  Lab 01/25/18 1938 01/26/18 0559  TROPONINI <0.03 0.03*    CBG: Recent Labs  Lab 01/29/18 2355 01/30/18 0749 01/30/18 1619 01/31/18 0006 01/31/18 0744  GLUCAP 104* 108* 120* 105* 98    Recent Results (from the past 240 hour(s))  MRSA PCR Screening     Status: None   Collection Time: 01/25/18  7:40 PM  Result Value Ref Range Status   MRSA by PCR NEGATIVE NEGATIVE Final    Comment:        The GeneXpert MRSA Assay (FDA approved for NASAL specimens only), is one component of a comprehensive MRSA colonization surveillance program. It is not intended to diagnose MRSA infection nor to guide or monitor treatment for MRSA infections. Performed at [facility], [address] [place], [place] [address]    CULTURE, BLOOD (ROUTINE X 2) w Reflex to ID Panel     Status: None   Collection Time: 01/26/18  6:02 AM  Result Value Ref Range Status   Specimen Description BLOOD RAC  Final   Special Requests   Final    BOTTLES DRAWN AEROBIC AND ANAEROBIC Blood Culture adequate volume   Culture   Final    NO GROWTH 5 DAYS Performed at [facility], [address] [place], [place] [address]    Report Status 01/31/2018 FINAL  Final  CULTURE, BLOOD (ROUTINE X 2) w Reflex to ID Panel     Status: None   Collection Time: 01/26/18  6:02 AM  Result Value Ref Range Status   Specimen Description BLOOD RFA  Final   Special Requests   Final    BOTTLES DRAWN AEROBIC AND ANAEROBIC Blood Culture adequate volume   Culture   Final    NO GROWTH 5 DAYS Performed at [facility], [address], [place], [place] [address]    Report Status 01/31/2018 FINAL  Final     Scheduled Meds: . amiodarone  200 mg Oral Daily  . amoxicillin-clavulanate  1 tablet Oral Q12H  . digoxin  0.125 mg Oral Daily  . fluticasone  2 spray Each Nare Daily  . ipratropium-albuterol  3 mL Nebulization TID  . mouth rinse  15 mL Mouth Rinse BID  . pantoprazole  40 mg Oral Daily  . polyethylene glycol  17 g Oral Daily  . sertraline  50 mg Oral Daily  . traZODone  50 mg Oral QHS   Continuous Infusions:   Assessment/Plan:  1. Possible aspiration pneumonia Zosyn would cover.  Continue Augmentin for 4 days total. 2. Acute delirium has cleared.  Continue trazodone at night 3. Acute cholecystitis status post 2 surgical procedures.  One procedure for gallbladder removal and another procedure for postoperative bleeding.  The quicker the patient can convert to Tylenol the better. 4. History of atrial fibrillation on amiodarone and digoxin.  I will leave the restarting of blood thinner up to the surgical team.  Since the patient had bleeding postoperatively he will need to be their call on whether or not to restart the blood  thinner.  The patient does have a higher risk of stroke being off the blood thinner.  But since the patient had postoperative bleeding, I will leave it up to the surgeons on when to restart. 5. Acute blood loss anemia.  Status post  1 unit of packed red blood cells Friday 6. History of bladder cancer, history of prostate cancer 7. Weakness.  Physical therapy to reevaluate today.  Family considering home with home health.  They want to see if he is stronger today prior to disposition.  Code Status:     Code Status Orders  (From admission, onward)         Start     Ordered   01/25/18 1429  Full code  Continuous     01/25/18 1428        Code Status History    Date Active Date Inactive Code Status Order ID Comments User Context   11/12/2017 1450 11/15/2017 2141 Full Code [id number]  Epifanio Lesches, MD ED   10/05/2015 2157 10/09/2015 1834 Full Code [id number]  Gladstone Lighter, MD ED     Family Communication: Daughter today Disposition Plan: Family now interested in rehab  Consultants:  General surgery  Procedures:  Cholecystectomy  Postoperative bleeding procedure  Antibiotics:  Augmentin  Time spent: 25 minutes  [place]

## 2018-01-31 NOTE — Clinical Social Work Note (Signed)
After much deliberating and changing disposition a few times, the family and patient are wanting to proceed with discharge to [facility]. Discharge information sent. Patient's daughter, [name], to transport. Shela Leff MSW,LCSW

## 2018-01-31 NOTE — Care Management Important Message (Signed)
Copy of signed IM left with patient in room.  

## 2018-01-31 NOTE — Progress Notes (Signed)
RN called report to [facility]. Awaiting daughter who will transport pt there.

## 2018-01-31 NOTE — Discharge Summary (Signed)
Discharge Summary  Patient ID: Charles Cook MRN: [id number] DOB/AGE: 1930/02/12 84 y.o.  Admit date: 01/25/2018 Discharge date: 01/31/2018  Discharge Diagnoses Acute cholecystitis Post-operative Bleeding Aspiration PNA  Consultants Critical Care Internal Medicine  Procedures Laparoscopic cholecystectomy on 01/25/18 Exploratory Laparoscopy on 09/27  HPI: Charles Cook is a 84 y.o. male who presented to [facility] on 01/25/18 for laparoscopic cholecystectomy.   Hospital Course: Charles Cook was admitted to general surgery following the procedure for monitoring. On 09/26 during the same day as his surgery, he become unresponsive while transferring from his chair. At that time, a CODE BLUE was called as no pulse was appreciated. By the time nursing staff returned him to the bed and the code team arrived, he had regained consciousness. He was sent to the ICU for monitoring. That evening, he again had another episode of hypotension and began oozing from his JP drain site. The decision was then made to undergo exploratory laparotomy with concern for post operative bleeding, which did not reveal any significant bleed. He was then admitted back to the ICU for resuscitation and hgb trending. He was in the ICU from 09/26-09/28. He received 2 transfusions over that time. Over the next few days (9/28-10/2), his Hgb was trended and remained stable, his leukocytosis resolved, and his T Bili levels returned to normal range. His diet was advanced as he tolerated, and he began working with PT. PT recommended discharge to SNF for weakness and need for assistance with ambulation, which the patient and his family were ultimately agreeable with.   On the day of discharge (01/31/18), he was tolerating a diet and his pain was well controlled. There was no longer a concern for bleed. He will be discharged to SNF with JP drain in place with instructions on how to monitor output at home. He is to follow up on Monday. He will  also go home with a 4 day course of Augmentin to cover his PNA per hospitalist recommendations. Wound instructions were given to the patient, his daughter, and were written on discharge instructions.   Patient is stable for discharge to SNF     Allergies as of 01/31/2018      Reactions   Moxifloxacin Hcl In Nacl Other (See Comments)   Confusion       Medication List    TAKE these medications   acetaminophen 500 MG tablet Commonly known as:  TYLENOL Take 1,000 mg by mouth daily as needed for moderate pain or headache.   amiodarone 200 MG tablet Commonly known as:  PACERONE Take 200 mg by mouth daily.   amoxicillin-clavulanate 875-125 MG tablet Commonly known as:  AUGMENTIN Take 1 tablet by mouth 2 (two) times daily for 4 days.   apixaban 2.5 MG Tabs tablet Commonly known as:  ELIQUIS Take 1 tablet (2.5 mg total) by mouth 2 (two) times daily.   aspirin EC 81 MG tablet Take 81 mg by mouth at bedtime.   Calcium-D 600-400 MG-UNIT Tabs Take 1 tablet by mouth 2 (two) times daily.   denosumab 60 MG/ML Sosy injection Commonly known as:  PROLIA Inject 60 mg into the skin every 6 (six) months.   digoxin 0.25 MG tablet Commonly known as:  LANOXIN Take 0.5 tablets (0.125 mg total) by mouth daily. What changed:  how much to take   diphenhydrAMINE 25 MG tablet Commonly known as:  BENADRYL Take 25 mg by mouth at bedtime as needed for allergies.   fluticasone 50 MCG/ACT nasal spray Commonly  known as:  FLONASE USE 2 SPRAYS IN EACH NOSTRIL DAILY What changed:    when to take this  reasons to take this   LUPRON DEPOT (31-MONTH) IM Inject into the muscle every 6 (six) months.   metoprolol succinate 25 MG 24 hr tablet Commonly known as:  TOPROL-XL Take 25 mg by mouth every evening.   multivitamin with minerals Tabs tablet Take 1 tablet by mouth daily.   niacin 500 MG CR tablet Commonly known as:  NIASPAN TAKE ONE TABLET BY MOUTH ONCE DAILY   oxyCODONE 5 MG immediate  release tablet Commonly known as:  Oxy IR/ROXICODONE Take 1 tablet (5 mg total) by mouth every 6 (six) hours as needed for severe pain.   polyethylene glycol packet Commonly known as:  MIRALAX / GLYCOLAX Take 17 g by mouth daily as needed for mild constipation.   sertraline 50 MG tablet Commonly known as:  ZOLOFT Take 50 mg by mouth at bedtime.   traZODone 100 MG tablet Commonly known as:  DESYREL TAKE ONE TABLET BY MOUTH AT BEDTIME   Vitamin B-12 5000 MCG Subl Place 5,000 mcg under the tongue daily.            Discharge Care Instructions  (From admission, onward)         Start     Ordered   01/31/18 0000  Discharge wound care:    Comments:  Change dressing over JP drain every other day Okay to shower Do NOT submerge wounds underwater Please empty JP drain 3x day and record output daily   01/31/18 1322            Contact information for follow-up providers    Pabon, [place] F, MD. Schedule an appointment as soon as possible for a visit.   Specialty:  General Surgery Why:  make an appointment for Monday 02/05/18...s/p lap chole and ex lap on 09/27...has JP drain Contact information: [address] [place] [address] [phone]            Contact information for after-discharge care    Destination    [facility] Preferred SNF .   Service:  Skilled Nursing Contact information: [address] [place] [phone]                  Signed: Edison Simon , PA-C  [facility]  01/31/2018, 1:22 PM [phone] M-F: 7am - 4pm

## 2018-01-31 NOTE — Progress Notes (Addendum)
Physical Therapy Treatment Patient Details Name: Charles Cook MRN: [id number] DOB: 1929/09/06 Today's Date: 01/31/2018    History of Present Illness Patient is an 84 year old male admitted for cholecystitis and received a cholecystectomy on 01/26/2018.  PMH includes prostate CA, MI, HLD, HTN, depression, cardiomyopathy, hiatal hernia, bladder CA and atrial fibrillation.    PT Comments    Initial attempt, pt with other staff. Second attempt, pt agreeable to PT; reports 0/10 pain at rest in recliner, but increases to 8/10 with stand. Pt demonstrates good LE movement with reclined and seated exercises with decreased endurance. STS with Min guard performed safely, but with pain in abdomen and decreased endurance requesting seated rest after 2 attempts to stand and ambulate. Pt notes urgent need to have a BM; BSC brought to pt and pt able to demonstrate Min guard STS and few steps chair to/from [facility] with Min guard. Pt does not feel he can ambulate further at this time. Pt notes he does not "normally walk much at home" and "spends a lot of time in recliner"; however, recommendation will remain skilled nursing facility due to limited mobility at this time. Pt states he would prefer to go home; if pt ultimately goes home with HHPT and nursing care, would recommend BSC (pt states he only has tub chair) for safety, as pt does spend time home alone. Continue PT at this time for progression of strength and endurance to improve all functional mobility. Spoke with both nurse and CM regarding treatment session.  Follow Up Recommendations  SNF     Equipment Recommendations  Other (comment)(if pt ultimately returns home with HHPT/nurse; needs BSC)    Recommendations for Other Services       Precautions / Restrictions Precautions Precautions: Fall Restrictions Weight Bearing Restrictions: No    Mobility  Bed Mobility               General bed mobility comments: Not tested; up in chair    Transfers Overall transfer level: Needs assistance Equipment used: Rolling walker (2 wheeled) Transfers: Sit to/from Stand Sit to Stand: Min guard         General transfer comment: Performed 3x; Pain with STS, but performs safely with good hand placement. Slow/steady to rise; good eccentric control to sit. Total 4 STS  Ambulation/Gait Ambulation/Gait assistance: Min guard Gait Distance (Feet): 3 Feet Assistive device: Rolling walker (2 wheeled)       General Gait Details: Recliner to/from [facility]; feels weak upon stand, unable to ambulate further   Stairs             Wheelchair Mobility    Modified Rankin (Stroke Patients Only)       Balance Overall balance assessment: Needs assistance Sitting-balance support: Feet supported Sitting balance-Leahy Scale: Good     Standing balance support: Bilateral upper extremity supported Standing balance-Leahy Scale: Fair                              Cognition Arousal/Alertness: Awake/alert Behavior During Therapy: WFL for tasks assessed/performed Overall Cognitive Status: Within Functional Limits for tasks assessed                                        Exercises General Exercises - Lower Extremity Ankle Circles/Pumps: AROM;Both;15 reps Long Arc Quad: AROM;Both;10 reps;Seated Heel Slides: AROM;Both;10 reps Hip ABduction/ADduction:  AROM;Both;10 reps Hip Flexion/Marching: AROM;Both;10 reps Other Exercises Other Exercises: encouraged abdominal brace with movement    General Comments        Pertinent Vitals/Pain Pain Assessment: 0-10 Pain Score: 8 (With stand; 0 at rest in recliner) Pain Location: incision site R abdomen Pain Intervention(s): Monitored during session;Limited activity within patient's tolerance    Home Living                      Prior Function            PT Goals (current goals can now be found in the care plan section) Progress towards PT goals:  Progressing toward goals    Frequency    Min 2X/week      PT Plan Current plan remains appropriate    Co-evaluation              AM-PAC PT "6 Clicks" Daily Activity  Outcome Measure  Difficulty turning over in bed (including adjusting bedclothes, sheets and blankets)?: A Little Difficulty moving from lying on back to sitting on the side of the bed? : A Little Difficulty sitting down on and standing up from a chair with arms (e.g., wheelchair, bedside commode, etc,.)?: Unable Help needed moving to and from a bed to chair (including a wheelchair)?: A Little Help needed walking in hospital room?: A Lot Help needed climbing 3-5 steps with a railing? : Total 6 Click Score: 13    End of Session Equipment Utilized During Treatment: Gait belt Activity Tolerance: Patient limited by pain;Other (comment)(weakness) Patient left: in chair;with call bell/phone within reach;with chair alarm set Nurse Communication: Other (comment)(also with SW; functional status) PT Visit Diagnosis: Unsteadiness on feet (R26.81);History of falling (Z91.81);Muscle weakness (generalized) (M62.81)     Time: 6578-4696 PT Time Calculation (min) (ACUTE ONLY): 27 min  Charges:  $Gait Training: 8-22 mins $Therapeutic Activity: 8-22 mins                      Larae Grooms, PTA 01/31/2018, 11:40 AM

## 2018-01-31 NOTE — Progress Notes (Addendum)
[facility] Progress Note  6 Days Post-Op  Subjective: Patient sitting up in chair eating breakfast. He notes that he is feeling much better. No complaints of abdominal pain, nausea, or emesis. + BM. Did not work with PT yesterday but notes that he was able to walk around the unit. Originally thought about SNF, now would like home with [facility].   Objective: Vital signs in last 24 hours: Temp:  [98.2 F (36.8 C)-98.6 F (37 C)] 98.6 F (37 C) (10/02 0500) Pulse Rate:  [67-96] 93 (10/02 0500) Resp:  [16-20] 20 (10/02 0500) BP: (155-172)/(60-64) 163/63 (10/02 0500) SpO2:  [89 %-96 %] 92 % (10/02 0722) Last BM Date: 01/30/18  Intake/Output from previous day: 10/01 0701 - 10/02 0700 In: 221.1 [P.O.:120; IV Piggyback:101.1] Out: -  Intake/Output this shift: No intake/output data recorded.  PE: Gen:  Alert, NAD, pleasant Pulm:  Normal effort,  Abd: Soft, non-tender, non-distended, incisions C/D/I, JP in RUQ with questionably bile tinged serous fluid in bulb Skin: warm and dry, no rashes  Psych: A&Ox3   Lab Results:  Recent Labs    01/30/18 0346 01/31/18 0506  WBC 8.8 8.7  HGB 9.4* 9.0*  HCT 26.5* 25.4*  PLT 237 261   BMET Recent Labs    01/31/18 0506  NA 134*  K 3.5  CL 99  CO2 25  GLUCOSE 109*  BUN 12  CREATININE 0.85  CALCIUM 7.3*   PT/INR No results for input(s): LABPROT, INR in the last 72 hours. CMP     Component Value Date/Time   NA 134 (L) 01/31/2018 0506   NA 136 11/21/2017 1145   K 3.5 01/31/2018 0506   CL 99 01/31/2018 0506   CO2 25 01/31/2018 0506   GLUCOSE 109 (H) 01/31/2018 0506   BUN 12 01/31/2018 0506   BUN 10 11/21/2017 1145   CREATININE 0.85 01/31/2018 0506   CALCIUM 7.3 (L) 01/31/2018 0506   PROT 5.0 (L) 01/27/2018 0505   PROT 5.4 (L) 11/21/2017 1145   ALBUMIN 2.8 (L) 01/27/2018 0505   ALBUMIN 3.5 11/21/2017 1145   AST 92 (H) 01/27/2018 0505   ALT 63 (H) 01/27/2018 0505   ALKPHOS 51 01/27/2018 0505   BILITOT 0.9  01/27/2018 0505   BILITOT 0.3 11/21/2017 1145   GFRNONAA >60 01/31/2018 0506   GFRAA >60 01/31/2018 0506   Lipase     Component Value Date/Time   LIPASE 23 11/12/2017 0855       Studies/Results: Dg Chest Port 1 View  Result Date: 01/29/2018 CLINICAL DATA:  84 year old male with wheezing.  Weakness. EXAM: PORTABLE CHEST 1 VIEW COMPARISON:  01/19/2018 and earlier. FINDINGS: Portable AP upright view at 0920 hours. Lower lung volumes with multifocal new pulmonary opacity, most confluent at the left lung base. A right upper quadrant pigtail drain has been removed since the prior images. No pneumothorax or pulmonary edema. No definite pleural effusion. Stable cardiac size and mediastinal contours. Prior CABG. Stable cardiac size and mediastinal contours. Negative visible bowel gas pattern. Chronic right lateral rib fractures. IMPRESSION: 1. New left lung base and right hilar airspace opacity suspicious for aspiration and/or pneumonia. No definite pleural effusion. 2. Cholecystostomy tube has been removed since 01/19/2018. Electronically Signed   By: Genevie Ann M.D.   On: 01/29/2018 09:34    Anti-infectives: Anti-infectives (From admission, onward)   Start     Dose/Rate Route Frequency Ordered Stop   01/31/18 1000  amoxicillin-clavulanate (AUGMENTIN) 875-125 MG per tablet 1 tablet  1 tablet Oral Every 12 hours 01/30/18 1636 02/04/18 0959   01/26/18 1000  vancomycin (VANCOCIN) IVPB 750 mg/150 ml premix  Status:  Discontinued     750 mg 150 mL/hr over 60 Minutes Intravenous Every 12 hours 01/26/18 0533 01/26/18 1039   01/26/18 0600  piperacillin-tazobactam (ZOSYN) IVPB 3.375 g     3.375 g 12.5 mL/hr over 240 Minutes Intravenous Every 8 hours 01/26/18 0241 01/31/18 0114   01/26/18 0245  vancomycin (VANCOCIN) IVPB 1000 mg/200 mL premix     1,000 mg 200 mL/hr over 60 Minutes Intravenous  Once 01/26/18 0241 01/26/18 0505   01/25/18 2345  ceFAZolin (ANCEF) IVPB 2g/100 mL premix     2 g 200  mL/hr over 30 Minutes Intravenous On call to O.R. 01/25/18 2336 01/26/18 0051   01/25/18 0803  ceFAZolin (ANCEF) 2-4 GM/100ML-% IVPB    Note to Pharmacy:  Phineas Real   : cabinet override      01/25/18 0803 01/25/18 2029   01/25/18 0600  ceFAZolin (ANCEF) IVPB 2g/100 mL premix     2 g 200 mL/hr over 30 Minutes Intravenous On call to O.R. 01/24/18 2207 01/25/18 0917       Assessment/Plan  Cholecystitis with subsequent exploratory laparotomy for bleeding - POD6, remains clinical stable, VSS, tolerating a diet - Hgb 9.0 this morning, stable - No leukocytosis - Tolerating full diet, had BM yesterday - No output charted for JP drain per chart review, about 50 ccs in bulb on examination. Output still too high, will likely d/c home with this.  - Continue IV ABx - Pain control as needed - Encouraged IS use, pulmonary toilet - Patient and daughter now would like to go home with [facility] PT, originally recommended SNF. PT to reassess. CSW aware, appreciate their help - Hospitalist following, CXR concerning for aspiration, continue Zosyn, would need Augmentin at discharge (for 5 days from 10/01)  - Could restart Eliquis tomorrow  - Likely home vs to SNF this afternoon    LOS: 6 days    Edison Simon , PA-C [facility] 01/31/2018, 9:21 AM [phone] M-F: 7am - 4pm

## 2018-02-03 DIAGNOSIS — Z8546 Personal history of malignant neoplasm of prostate: Secondary | ICD-10-CM | POA: Diagnosis not present

## 2018-02-03 DIAGNOSIS — F418 Other specified anxiety disorders: Secondary | ICD-10-CM | POA: Diagnosis not present

## 2018-02-03 DIAGNOSIS — K810 Acute cholecystitis: Secondary | ICD-10-CM | POA: Diagnosis not present

## 2018-02-03 DIAGNOSIS — J189 Pneumonia, unspecified organism: Secondary | ICD-10-CM | POA: Diagnosis not present

## 2018-02-03 DIAGNOSIS — I4891 Unspecified atrial fibrillation: Secondary | ICD-10-CM | POA: Diagnosis not present

## 2018-02-03 DIAGNOSIS — M810 Age-related osteoporosis without current pathological fracture: Secondary | ICD-10-CM | POA: Diagnosis not present

## 2018-02-03 DIAGNOSIS — I10 Essential (primary) hypertension: Secondary | ICD-10-CM | POA: Diagnosis not present

## 2018-02-06 ENCOUNTER — Other Ambulatory Visit: Payer: Self-pay | Admitting: *Deleted

## 2018-02-06 ENCOUNTER — Ambulatory Visit (INDEPENDENT_AMBULATORY_CARE_PROVIDER_SITE_OTHER): Payer: Medicare Other | Admitting: Surgery

## 2018-02-06 ENCOUNTER — Encounter: Payer: Self-pay | Admitting: Surgery

## 2018-02-06 VITALS — BP 167/74 | HR 58 | Temp 97.8°F

## 2018-02-06 DIAGNOSIS — Z4889 Encounter for other specified surgical aftercare: Secondary | ICD-10-CM

## 2018-02-06 NOTE — Progress Notes (Signed)
[facility] Progress/Follow-up Note   HPI:  84 y.o. Male presents to clinic for post-op follow-up 12 Days s/p laparoscopic converted to open cholecystectomy with intra-operative cholangiogram (Pabon, 01/25/2018) for chronic cholecystitis and return to OR for exploratory laparotomy with concern for post-surgical bleeding not found at time of re-exploration (01/26/2018). Patient reports he's been feeling overall well and eating well with a good appetite. He has, however, been experiencing constipation, for which he was taking routine daily Miralax until he discontinued it 2 days ago due to loose BM's and has not since had a BM x 2 days. His surgically placed drain has been draining 25 - 55 mL purulent fluid daily over the past several days, and he says the bulb suction from his drain "fell off" overnight last night and he couldn't find it this morning. Patient otherwise describes +flatus and denies N/V, fever/chills, CP, or SOB.  Review of Systems:  Constitutional: denies fever/chills  Respiratory: denies shortness of breath, wheezing  Cardiovascular: denies chest pain, palpitations  Gastrointestinal: abdominal pain, N/V, and bowel function as per interval history Skin: Denies any other rashes or skin discolorations except post-surgical wounds as per interval history  Vital Signs:  BP (!) 167/74   Pulse (!) 58   Temp 97.8 F (36.6 C) (Skin)    Physical Exam:  Constitutional:  -- Normal  -- Awake, alert, and oriented x3  Pulmonary:  -- No crackles -- Equal breath sounds bilaterally -- Breathing non-labored at rest Cardiovascular:  -- S1, S2 present  -- No pericardial rubs  Gastrointestinal:  -- Soft and non-distended with minimal peri-incisional tenderness to palpation, no guarding/rebound tenderness -- Post-surgical incisions all well-approximated without any peri-incisional erythema or drainage -- Surgically placed drain via RUQ laparoscopic port site well-secured with purulent  fluid in tubing without any bulb suction attached to tubing -- No abdominal masses appreciated, pulsatile or otherwise  Musculoskeletal / Integumentary:  -- Wounds or skin discoloration: None appreciated except post-surgical incisions as described above (GI) -- Extremities: B/L UE and LE FROM, hands and feet warm, no edema   Imaging: No new pertinent imaging available for review  Assessment:  84 y.o. yo Male with a problem list including...  Patient Active Problem List   Diagnosis Date Noted  . Acute post-hemorrhagic anemia   . Acute cholecystitis 11/12/2017  . Cholecystitis   . Osteopenia 03/13/2017  . Sepsis ([place]) 10/07/2015  . Syncope 10/05/2015  . Congestive cardiomyopathy ([place]) 07/09/2015  . Persistent atrial fibrillation 07/09/2015  . Gallstone 07/08/2015  . Hx of extrinsic asthma 02/06/2015  . Benign fibroma of prostate 12/16/2014  . Arteriosclerosis of coronary artery 12/16/2014  . Clinical depression 12/16/2014  . Fracture of clavicle 12/16/2014  . H/O: depression 12/16/2014  . H/O coronary artery bypass surgery 12/16/2014  . H/O malignant neoplasm of prostate 12/16/2014  . HLD (hyperlipidemia) 12/16/2014  . BP (high blood pressure) 12/16/2014  . Infection with methicillin-resistant Staphylococcus aureus 12/16/2014  . Diaphragmatic hernia 09/22/2006    presents to clinic for post-op follow-up evaluation, doing overall well despite constipation and purulent drainage from surgically placed drain 12 Days s/p laparoscopic converted to open cholecystectomy with intra-operative cholangiogram (Pabon, 01/25/2018) for chronic cholecystitis and return to OR for exploratory laparotomy with concern for post-surgical bleeding not found at time of re-exploration (01/26/2018).  Plan:              - advance diet as tolerated  - every other staple removed, steri-strips applied  - importance of hydration discussed, metamucil  daily since says high-fiber foods not available at nursing  facility, colace routine once daily unless loose BM's, and Miralax prn if constipation despite above recommendations             - okay to shower, but do not submerge incisions under water (baths/swimming) until after next follow-up incisions under water (baths, swimming) prn             - continue no heavy lifting >15 - 20 lbs for 2 more days, followed by no lifting >40 lbs             - apply sunblock particularly to incisions with sun exposure to reduce pigmentation of scars             - return to clinic in 1 week for reassessment by patient's surgeon, Dr. Dahlia Byes  - instructed to call office if any questions or concerns  All of the above recommendations were discussed with the patient and patient's family, and all of patient's and family's questions were answered to their expressed satisfaction.  -- Marilynne Drivers Rosana Hoes, MD, [place]: [facility] - Partnering for exceptional care. Office: [phone]

## 2018-02-06 NOTE — Patient Instructions (Signed)
Please give us a call in case you have any questions or concerns.  

## 2018-02-06 NOTE — Patient Outreach (Signed)
[facility]) Care Management  02/06/2018  Charles Cook [birth year] [id number]   Attended discharge planning meeting at [facility] along with [facility] UM team member Marlowe Kays.  PT stated patient was able to ambulate with walker, is still requiring max assistance with ADLs and going to the restroom. He is motivated to work with therapy and get home. Daughter is involved with care and is making arrangements for him to come home.   Visited patient's bedside with [facility]. Patient had just returned from a MD visit.  Patient stated he was working hard in therapy and really wants to get home.  Patient noted to have a j/p drain. Patient stated he did not have a discharge plan yet, but feels his daughter is working on it.    Plan to visit patient again next week when closer to discharge.   Rutherford Limerick RN, BSN [place] Acute Care Coordinator [phone]) [employer] [phone]) Toll free office

## 2018-02-08 ENCOUNTER — Ambulatory Visit (INDEPENDENT_AMBULATORY_CARE_PROVIDER_SITE_OTHER): Payer: Medicare Other | Admitting: Physician Assistant

## 2018-02-08 ENCOUNTER — Encounter: Payer: Self-pay | Admitting: Physician Assistant

## 2018-02-08 VITALS — BP 150/56 | HR 62 | Temp 97.8°F | Resp 16 | Wt 175.4 lb

## 2018-02-08 DIAGNOSIS — R600 Localized edema: Secondary | ICD-10-CM | POA: Diagnosis not present

## 2018-02-08 DIAGNOSIS — K810 Acute cholecystitis: Secondary | ICD-10-CM | POA: Diagnosis not present

## 2018-02-08 DIAGNOSIS — F418 Other specified anxiety disorders: Secondary | ICD-10-CM | POA: Diagnosis not present

## 2018-02-08 DIAGNOSIS — Z9049 Acquired absence of other specified parts of digestive tract: Secondary | ICD-10-CM

## 2018-02-08 DIAGNOSIS — Z8546 Personal history of malignant neoplasm of prostate: Secondary | ICD-10-CM | POA: Diagnosis not present

## 2018-02-08 DIAGNOSIS — I10 Essential (primary) hypertension: Secondary | ICD-10-CM

## 2018-02-08 DIAGNOSIS — I4891 Unspecified atrial fibrillation: Secondary | ICD-10-CM | POA: Diagnosis not present

## 2018-02-08 DIAGNOSIS — J189 Pneumonia, unspecified organism: Secondary | ICD-10-CM | POA: Diagnosis not present

## 2018-02-08 DIAGNOSIS — M810 Age-related osteoporosis without current pathological fracture: Secondary | ICD-10-CM | POA: Diagnosis not present

## 2018-02-08 NOTE — Progress Notes (Signed)
Patient: Charles Cook Male    DOB: 13-Apr-1930   84 y.o.   MRN: [id number] Visit Date: 02/08/2018  Today's Provider: Mar Daring, PA-C   Chief Complaint  Patient presents with  . Edema  . Hypertension   Subjective:    HPI Patient coming here today with c/o of Edema on pt's ankles. He is also having some elevated BP.   Patient 2 weeks postop cholecystectomy on 01/15/18 by Dr. Dahlia Byes. He had post op complications of syncope and code following surgery and had to be taken back in for exploratory laparotomy to make sure he did not have a post-op bleed. He was discharged to [facility] for rehabilitation and PT.   Patient also has some drainage from surgery site. Was seen on 02/06/18 by gen surg and had every other staple removed and dressing changed. They were not given instructions about dressing change. Dressing from 02/06/18 is still on patient today and visibly dirty.   Patient here with daughter Selena Batten. Reports today is [facility] last day with [facility]. She is very upset because patient was not dressed when she got there to pick him up and his bandages had not been changed x 5 days.  Patient is going to have a nurse aid to come to his home.    Allergies  Allergen Reactions  . Moxifloxacin Hcl In Nacl Other (See Comments)    Confusion      Current Outpatient Medications:  .  acetaminophen (TYLENOL) 500 MG tablet, Take 1,000 mg by mouth daily as needed for moderate pain or headache., Disp: , Rfl:  .  amiodarone (PACERONE) 200 MG tablet, Take 200 mg by mouth daily. , Disp: , Rfl:  .  apixaban (ELIQUIS) 2.5 MG TABS tablet, Take 1 tablet (2.5 mg total) by mouth 2 (two) times daily., Disp: 60 tablet, Rfl: 1 .  aspirin EC 81 MG tablet, Take 81 mg by mouth at bedtime., Disp: , Rfl:  .  Calcium Carbonate-Vitamin D (CALCIUM-D) 600-400 MG-UNIT TABS, Take 1 tablet by mouth 2 (two) times daily. , Disp: , Rfl:  .  Cyanocobalamin (VITAMIN B-12) 5000 MCG SUBL, Place 5,000 mcg under the  tongue daily., Disp: , Rfl:  .  denosumab (PROLIA) 60 MG/ML SOSY injection, Inject 60 mg into the skin every 6 (six) months., Disp: , Rfl:  .  digoxin (DIGOX) 0.25 MG tablet, Take 0.5 tablets (0.125 mg total) by mouth daily. (Patient taking differently: Take 0.25 mg by mouth daily. ), Disp: 90 tablet, Rfl: 1 .  diphenhydrAMINE (BENADRYL) 25 MG tablet, Take 25 mg by mouth at bedtime as needed for allergies. , Disp: , Rfl:  .  fluticasone (FLONASE) 50 MCG/ACT nasal spray, USE 2 SPRAYS IN EACH NOSTRIL DAILY (Patient taking differently: Place 2 sprays into both nostrils daily as needed for allergies. ), Disp: 48 g, Rfl: 1 .  Leuprolide Acetate, 6 Month, (LUPRON DEPOT, 46-MONTH, IM), Inject into the muscle every 6 (six) months., Disp: , Rfl:  .  metoprolol succinate (TOPROL-XL) 25 MG 24 hr tablet, Take 25 mg by mouth every evening. , Disp: , Rfl:  .  Multiple Vitamin (MULTIVITAMIN WITH MINERALS) TABS tablet, Take 1 tablet by mouth daily., Disp: , Rfl:  .  niacin (NIASPAN) 500 MG CR tablet, TAKE ONE TABLET BY MOUTH ONCE DAILY, Disp: 90 tablet, Rfl: 3 .  oxyCODONE (OXY IR/ROXICODONE) 5 MG immediate release tablet, Take 1 tablet (5 mg total) by mouth every 6 (six) hours as  needed for severe pain. (Patient not taking: Reported on 02/08/2018), Disp: 20 tablet, Rfl: 0 .  polyethylene glycol (MIRALAX / GLYCOLAX) packet, Take 17 g by mouth daily as needed for mild constipation., Disp: , Rfl:  .  sertraline (ZOLOFT) 50 MG tablet, Take 50 mg by mouth at bedtime. , Disp: , Rfl: 4 .  traZODone (DESYREL) 100 MG tablet, TAKE ONE TABLET BY MOUTH AT BEDTIME, Disp: 90 tablet, Rfl: 1  Review of Systems  Constitutional: Negative.   Respiratory: Negative.   Cardiovascular: Negative.   Gastrointestinal: Negative.   Neurological: Negative.     Social History   Tobacco Use  . Smoking status: Never Smoker  . Smokeless tobacco: Never Used  Substance Use Topics  . Alcohol use: Yes    Alcohol/week: 0.0 standard drinks      Comment: one glass in 6 months    Objective:   BP (!) 150/56 (BP Location: Left Arm, Patient Position: Sitting, Cuff Size: Normal)   Pulse 62   Temp 97.8 F (36.6 C) (Oral)   Resp 16   Wt 175 lb 6.4 oz (79.6 kg)   SpO2 95%   BMI 23.79 kg/m  Vitals:   02/08/18 1105  BP: (!) 150/56  Pulse: 62  Resp: 16  Temp: 97.8 F (36.6 C)  TempSrc: Oral  SpO2: 95%  Weight: 175 lb 6.4 oz (79.6 kg)     Physical Exam  Constitutional: He appears well-developed and well-nourished. No distress.  HENT:  Head: Normocephalic and atraumatic.  Neck: Normal range of motion. Neck supple.  Cardiovascular: Normal rate and regular rhythm. Exam reveals no gallop and no friction rub.  No murmur heard. Pulmonary/Chest: Effort normal and breath sounds normal. No respiratory distress. He has no wheezes. He has no rales.  Abdominal: Soft. Bowel sounds are normal. He exhibits no distension. There is no tenderness.    JP drain also in place. Mild serous drainage noted in bulb.   Musculoskeletal: He exhibits edema (trace to 1+ pitting edema bilaterally (R>L)).  Skin: He is not diaphoretic.  Vitals reviewed.      Assessment & Plan:     1. S/P cholecystectomy Doing well. Surgical incision examined today. Old serous drainage noted on bandage. None active. No dehiscence noted. Staples and steri-strips still in place. Wound cleaned, dried and redressed. Has f/u Monday with Dr. Dahlia Byes. Advised to clean incisional wound daily and redress.   2. Essential hypertension BP more stable today. Patient reports feeling better. Will monitor and f/u on Monday 02/19/18. Call if worsening.   3. Bilateral lower extremity edema Advised to wear compression stockings and elevate legs when he is at rest. Will re-evaluate on 02/19/18.        Mar Daring, PA-C  [facility]

## 2018-02-09 DIAGNOSIS — K5909 Other constipation: Secondary | ICD-10-CM | POA: Diagnosis not present

## 2018-02-09 DIAGNOSIS — G4709 Other insomnia: Secondary | ICD-10-CM | POA: Diagnosis not present

## 2018-02-09 DIAGNOSIS — Z7951 Long term (current) use of inhaled steroids: Secondary | ICD-10-CM | POA: Diagnosis not present

## 2018-02-09 DIAGNOSIS — D519 Vitamin B12 deficiency anemia, unspecified: Secondary | ICD-10-CM | POA: Diagnosis not present

## 2018-02-09 DIAGNOSIS — I10 Essential (primary) hypertension: Secondary | ICD-10-CM | POA: Diagnosis not present

## 2018-02-09 DIAGNOSIS — I4891 Unspecified atrial fibrillation: Secondary | ICD-10-CM | POA: Diagnosis not present

## 2018-02-09 DIAGNOSIS — E559 Vitamin D deficiency, unspecified: Secondary | ICD-10-CM | POA: Diagnosis not present

## 2018-02-09 DIAGNOSIS — Z7982 Long term (current) use of aspirin: Secondary | ICD-10-CM | POA: Diagnosis not present

## 2018-02-09 DIAGNOSIS — Z9181 History of falling: Secondary | ICD-10-CM | POA: Diagnosis not present

## 2018-02-09 DIAGNOSIS — J302 Other seasonal allergic rhinitis: Secondary | ICD-10-CM | POA: Diagnosis not present

## 2018-02-09 DIAGNOSIS — Z7901 Long term (current) use of anticoagulants: Secondary | ICD-10-CM | POA: Diagnosis not present

## 2018-02-09 DIAGNOSIS — Z4682 Encounter for fitting and adjustment of non-vascular catheter: Secondary | ICD-10-CM | POA: Diagnosis not present

## 2018-02-09 DIAGNOSIS — Z8701 Personal history of pneumonia (recurrent): Secondary | ICD-10-CM | POA: Diagnosis not present

## 2018-02-09 DIAGNOSIS — F3289 Other specified depressive episodes: Secondary | ICD-10-CM | POA: Diagnosis not present

## 2018-02-09 DIAGNOSIS — G44229 Chronic tension-type headache, not intractable: Secondary | ICD-10-CM | POA: Diagnosis not present

## 2018-02-09 DIAGNOSIS — Z48815 Encounter for surgical aftercare following surgery on the digestive system: Secondary | ICD-10-CM | POA: Diagnosis not present

## 2018-02-11 DIAGNOSIS — F3289 Other specified depressive episodes: Secondary | ICD-10-CM | POA: Diagnosis not present

## 2018-02-11 DIAGNOSIS — I4891 Unspecified atrial fibrillation: Secondary | ICD-10-CM | POA: Diagnosis not present

## 2018-02-11 DIAGNOSIS — D519 Vitamin B12 deficiency anemia, unspecified: Secondary | ICD-10-CM | POA: Diagnosis not present

## 2018-02-11 DIAGNOSIS — Z48815 Encounter for surgical aftercare following surgery on the digestive system: Secondary | ICD-10-CM | POA: Diagnosis not present

## 2018-02-11 DIAGNOSIS — I10 Essential (primary) hypertension: Secondary | ICD-10-CM | POA: Diagnosis not present

## 2018-02-11 DIAGNOSIS — E559 Vitamin D deficiency, unspecified: Secondary | ICD-10-CM | POA: Diagnosis not present

## 2018-02-12 ENCOUNTER — Encounter: Payer: Self-pay | Admitting: Surgery

## 2018-02-12 ENCOUNTER — Telehealth: Payer: Self-pay | Admitting: Physician Assistant

## 2018-02-12 ENCOUNTER — Ambulatory Visit (INDEPENDENT_AMBULATORY_CARE_PROVIDER_SITE_OTHER): Payer: Medicare Other | Admitting: Surgery

## 2018-02-12 VITALS — BP 146/87 | HR 81 | Temp 97.2°F | Ht 72.0 in | Wt 170.6 lb

## 2018-02-12 DIAGNOSIS — Z09 Encounter for follow-up examination after completed treatment for conditions other than malignant neoplasm: Secondary | ICD-10-CM

## 2018-02-12 NOTE — Telephone Encounter (Signed)
Verbal order was given to [facility] with Kirk.,PC

## 2018-02-12 NOTE — Progress Notes (Signed)
S/p chole complicated by abscess Completed a/bs Minimal output from drain less 5cc/day + PO, ambulating w walker.  PE NAD Abd: soft, nt, incision healing well , no infection JP removed, some minimal fibrinous exudate  A/P Doing well No acute surgical issues RTC next week for a final wound check

## 2018-02-12 NOTE — Telephone Encounter (Signed)
Merry Proud with [facility] called needing verbal for Pt .  3 times for 1 week , 2 times for 2 weeks and 1 time for 2 weeks  CB#  [phone]  Thanks Con [employer]

## 2018-02-12 NOTE — Patient Instructions (Signed)
Please see your follow up appointment listed below.  Please remove the dressing from the drain site and clean the area and apply a clean dressing until it heals completely.   The steri strips will begin to fall off within 7-10 days. You may shower as usual but be sure to pat the strips dry.

## 2018-02-12 NOTE — Telephone Encounter (Signed)
Yes please give verbal ok

## 2018-02-13 ENCOUNTER — Other Ambulatory Visit: Payer: Self-pay | Admitting: Family Medicine

## 2018-02-13 ENCOUNTER — Other Ambulatory Visit: Payer: Self-pay | Admitting: *Deleted

## 2018-02-13 DIAGNOSIS — E559 Vitamin D deficiency, unspecified: Secondary | ICD-10-CM | POA: Diagnosis not present

## 2018-02-13 DIAGNOSIS — D519 Vitamin B12 deficiency anemia, unspecified: Secondary | ICD-10-CM | POA: Diagnosis not present

## 2018-02-13 DIAGNOSIS — I10 Essential (primary) hypertension: Secondary | ICD-10-CM | POA: Diagnosis not present

## 2018-02-13 DIAGNOSIS — F3289 Other specified depressive episodes: Secondary | ICD-10-CM | POA: Diagnosis not present

## 2018-02-13 DIAGNOSIS — Z48815 Encounter for surgical aftercare following surgery on the digestive system: Secondary | ICD-10-CM | POA: Diagnosis not present

## 2018-02-13 DIAGNOSIS — C61 Malignant neoplasm of prostate: Secondary | ICD-10-CM

## 2018-02-13 DIAGNOSIS — I4891 Unspecified atrial fibrillation: Secondary | ICD-10-CM | POA: Diagnosis not present

## 2018-02-13 NOTE — Patient Outreach (Signed)
[facility]) Care Management  02/13/2018  CHEVEYO VIRGINIA [birth year] [id number]   Attended discharge planning meeting at [facility] with [facility] team member Marlowe Kays. Made aware by facility SW patient discharged earlier than anticipated to home with home health.  Patient discharged last Thursday 02/08/18 according to SW.  PT stated patient was doing well and had a good support system in place at discharge.   Rutherford Limerick RN, BSN [facility] Acute Care Coordinator [phone]) [employer] [phone]) Toll free office

## 2018-02-14 ENCOUNTER — Telehealth: Payer: Self-pay | Admitting: Physician Assistant

## 2018-02-14 ENCOUNTER — Ambulatory Visit: Payer: Medicare Other | Admitting: Physician Assistant

## 2018-02-14 DIAGNOSIS — I10 Essential (primary) hypertension: Secondary | ICD-10-CM | POA: Diagnosis not present

## 2018-02-14 DIAGNOSIS — D519 Vitamin B12 deficiency anemia, unspecified: Secondary | ICD-10-CM | POA: Diagnosis not present

## 2018-02-14 DIAGNOSIS — I4891 Unspecified atrial fibrillation: Secondary | ICD-10-CM | POA: Diagnosis not present

## 2018-02-14 DIAGNOSIS — F3289 Other specified depressive episodes: Secondary | ICD-10-CM | POA: Diagnosis not present

## 2018-02-14 DIAGNOSIS — E559 Vitamin D deficiency, unspecified: Secondary | ICD-10-CM | POA: Diagnosis not present

## 2018-02-14 DIAGNOSIS — Z48815 Encounter for surgical aftercare following surgery on the digestive system: Secondary | ICD-10-CM | POA: Diagnosis not present

## 2018-02-14 NOTE — Telephone Encounter (Signed)
error 

## 2018-02-14 NOTE — Telephone Encounter (Signed)
Sounds like orthostasis due to dehydration. Push fluids and use Imodium prn for diarrhea.

## 2018-02-14 NOTE — Telephone Encounter (Signed)
Charles Cook called saying pt is having some dizziness with standing.  Can not stand for very long at a time.  His bowels are loose and appetite is less than average.    This is an FYI  If you need to call Charles Cook back his contact # is [phone] .   Thanks [employer]

## 2018-02-14 NOTE — Telephone Encounter (Signed)
Charles Cook, [place] w/ [facility] [phone]  OT Eval was done.  Verbal order: 2 times weeks for 1 week.  Thanks, [employer]

## 2018-02-14 NOTE — Telephone Encounter (Signed)
Please Review

## 2018-02-14 NOTE — Telephone Encounter (Signed)
Please give verbal ok for OT.

## 2018-02-14 NOTE — Telephone Encounter (Signed)
Please Advise

## 2018-02-15 ENCOUNTER — Other Ambulatory Visit: Payer: Medicare Other

## 2018-02-15 DIAGNOSIS — Z48815 Encounter for surgical aftercare following surgery on the digestive system: Secondary | ICD-10-CM | POA: Diagnosis not present

## 2018-02-15 DIAGNOSIS — E559 Vitamin D deficiency, unspecified: Secondary | ICD-10-CM | POA: Diagnosis not present

## 2018-02-15 DIAGNOSIS — C61 Malignant neoplasm of prostate: Secondary | ICD-10-CM | POA: Diagnosis not present

## 2018-02-15 DIAGNOSIS — I10 Essential (primary) hypertension: Secondary | ICD-10-CM | POA: Diagnosis not present

## 2018-02-15 DIAGNOSIS — F3289 Other specified depressive episodes: Secondary | ICD-10-CM | POA: Diagnosis not present

## 2018-02-15 DIAGNOSIS — I4891 Unspecified atrial fibrillation: Secondary | ICD-10-CM | POA: Diagnosis not present

## 2018-02-15 DIAGNOSIS — D519 Vitamin B12 deficiency anemia, unspecified: Secondary | ICD-10-CM | POA: Diagnosis not present

## 2018-02-15 NOTE — Telephone Encounter (Signed)
Verbal orders given to Esther. 

## 2018-02-15 NOTE — Telephone Encounter (Signed)
No answer Mail box is full.  [facility] and informed what the provider was advising and also called [name] but no answer-per Konrad Dolores he is going to see patient tomorrow he is not with patient today.

## 2018-02-16 DIAGNOSIS — F3289 Other specified depressive episodes: Secondary | ICD-10-CM | POA: Diagnosis not present

## 2018-02-16 DIAGNOSIS — I4891 Unspecified atrial fibrillation: Secondary | ICD-10-CM | POA: Diagnosis not present

## 2018-02-16 DIAGNOSIS — I10 Essential (primary) hypertension: Secondary | ICD-10-CM | POA: Diagnosis not present

## 2018-02-16 DIAGNOSIS — D519 Vitamin B12 deficiency anemia, unspecified: Secondary | ICD-10-CM | POA: Diagnosis not present

## 2018-02-16 DIAGNOSIS — Z48815 Encounter for surgical aftercare following surgery on the digestive system: Secondary | ICD-10-CM | POA: Diagnosis not present

## 2018-02-16 DIAGNOSIS — E559 Vitamin D deficiency, unspecified: Secondary | ICD-10-CM | POA: Diagnosis not present

## 2018-02-16 LAB — PSA: Prostate Specific Ag, Serum: <0.1 ng/mL (ref 0.0–4.0)

## 2018-02-19 ENCOUNTER — Encounter: Payer: Self-pay | Admitting: Physician Assistant

## 2018-02-19 ENCOUNTER — Ambulatory Visit: Payer: Self-pay | Admitting: Surgery

## 2018-02-19 ENCOUNTER — Ambulatory Visit (INDEPENDENT_AMBULATORY_CARE_PROVIDER_SITE_OTHER): Payer: Medicare Other | Admitting: Physician Assistant

## 2018-02-19 VITALS — BP 138/60 | HR 65 | Temp 97.7°F | Resp 16 | Wt 167.2 lb

## 2018-02-19 DIAGNOSIS — I9581 Postprocedural hypotension: Secondary | ICD-10-CM | POA: Diagnosis not present

## 2018-02-19 DIAGNOSIS — Z9049 Acquired absence of other specified parts of digestive tract: Secondary | ICD-10-CM

## 2018-02-19 NOTE — Progress Notes (Signed)
Patient: Charles Cook Male    DOB: Nov 17, 1929   84 y.o.   MRN: [id number] Visit Date: 02/19/2018  Today's Provider: Mar Daring, PA-C   Chief Complaint  Patient presents with  . Follow-up    BP and Edema   Subjective:    HPI Patient here to follow-up BP and Bilateral Lower Extremity Edema. Patient was advised to wear compression stockings. Per patient's daughter [name] his swelling is better. Patient also getting PT, homecare, home health care nurse. [facility] checks patient BP and they come to see him twice a week. Patient also needing a letter for [facility] that he needs some assistance with ADLs.      Allergies  Allergen Reactions  . Moxifloxacin Hcl In Nacl Other (See Comments)    Confusion      Current Outpatient Medications:  .  acetaminophen (TYLENOL) 500 MG tablet, Take 1,000 mg by mouth daily as needed for moderate pain or headache., Disp: , Rfl:  .  amiodarone (PACERONE) 200 MG tablet, Take 200 mg by mouth daily. , Disp: , Rfl:  .  apixaban (ELIQUIS) 2.5 MG TABS tablet, Take 1 tablet (2.5 mg total) by mouth 2 (two) times daily., Disp: 60 tablet, Rfl: 1 .  aspirin EC 81 MG tablet, Take 81 mg by mouth at bedtime., Disp: , Rfl:  .  Calcium Carbonate-Vitamin D (CALCIUM-D) 600-400 MG-UNIT TABS, Take 1 tablet by mouth 2 (two) times daily. , Disp: , Rfl:  .  Cyanocobalamin (VITAMIN B-12) 5000 MCG SUBL, Place 5,000 mcg under the tongue daily., Disp: , Rfl:  .  denosumab (PROLIA) 60 MG/ML SOSY injection, Inject 60 mg into the skin every 6 (six) months., Disp: , Rfl:  .  digoxin (DIGOX) 0.25 MG tablet, Take 0.5 tablets (0.125 mg total) by mouth daily. (Patient taking differently: Take 0.25 mg by mouth daily. ), Disp: 90 tablet, Rfl: 1 .  diphenhydrAMINE (BENADRYL) 25 MG tablet, Take 25 mg by mouth at bedtime as needed for allergies. , Disp: , Rfl:  .  fluticasone (FLONASE) 50 MCG/ACT nasal spray, USE 2 SPRAYS IN EACH NOSTRIL DAILY (Patient taking  differently: Place 2 sprays into both nostrils daily as needed for allergies. ), Disp: 48 g, Rfl: 1 .  Leuprolide Acetate, 6 Month, (LUPRON DEPOT, 22-MONTH, IM), Inject into the muscle every 6 (six) months., Disp: , Rfl:  .  metoprolol succinate (TOPROL-XL) 25 MG 24 hr tablet, Take 25 mg by mouth every evening. , Disp: , Rfl:  .  Multiple Vitamin (MULTIVITAMIN WITH MINERALS) TABS tablet, Take 1 tablet by mouth daily., Disp: , Rfl:  .  niacin (NIASPAN) 500 MG CR tablet, TAKE ONE TABLET BY MOUTH ONCE DAILY, Disp: 90 tablet, Rfl: 3 .  oxyCODONE (OXY IR/ROXICODONE) 5 MG immediate release tablet, Take 1 tablet (5 mg total) by mouth every 6 (six) hours as needed for severe pain., Disp: 20 tablet, Rfl: 0 .  polyethylene glycol (MIRALAX / GLYCOLAX) packet, Take 17 g by mouth daily as needed for mild constipation., Disp: , Rfl:  .  sertraline (ZOLOFT) 50 MG tablet, Take 50 mg by mouth at bedtime. , Disp: , Rfl: 4 .  traZODone (DESYREL) 100 MG tablet, TAKE ONE TABLET BY MOUTH AT BEDTIME, Disp: 90 tablet, Rfl: 1  Review of Systems  Constitutional: Negative.   Respiratory: Negative.   Cardiovascular: Negative.   Gastrointestinal: Negative.   Neurological: Negative.   Psychiatric/Behavioral: Negative.     Social History  Tobacco Use  . Smoking status: Never Smoker  . Smokeless tobacco: Never Used  Substance Use Topics  . Alcohol use: Yes    Alcohol/week: 0.0 standard drinks    Comment: one glass in 6 months    Objective:   BP 138/60 (BP Location: Left Arm, Patient Position: Sitting, Cuff Size: Normal)   Pulse 65   Temp 97.7 F (36.5 C) (Oral)   Resp 16   Wt 167 lb 3.2 oz (75.8 kg)   SpO2 96%   BMI 22.68 kg/m  Vitals:   02/19/18 1115  BP: 138/60  Pulse: 65  Resp: 16  Temp: 97.7 F (36.5 C)  TempSrc: Oral  SpO2: 96%  Weight: 167 lb 3.2 oz (75.8 kg)     Physical Exam  Constitutional: He appears well-developed and well-nourished. No distress.  HENT:  Head: Normocephalic and  atraumatic.  Neck: Normal range of motion. Neck supple.  Cardiovascular: Normal rate, regular rhythm and normal heart sounds. Exam reveals no gallop and no friction rub.  No murmur heard. Pulmonary/Chest: Effort normal and breath sounds normal. No respiratory distress. He has no wheezes. He has no rales.  Abdominal:    Skin: He is not diaphoretic.  Vitals reviewed.       Assessment & Plan:     1. Postprocedural hypotension BP improving as oral intake and physical activity improves. Continue HH, PT, OT. I will see him back in 6 months. They are to call if any acute issue arises in the meantime.   2. S/P cholecystectomy Doing well. Has f/u with Gen Surg on Weds, 02/21/18.        Mar Daring, PA-C  [facility]

## 2018-02-20 ENCOUNTER — Encounter: Payer: Self-pay | Admitting: Urology

## 2018-02-20 ENCOUNTER — Ambulatory Visit (INDEPENDENT_AMBULATORY_CARE_PROVIDER_SITE_OTHER): Payer: Medicare Other | Admitting: Urology

## 2018-02-20 VITALS — BP 136/70 | HR 65 | Ht 72.0 in | Wt 168.0 lb

## 2018-02-20 DIAGNOSIS — F3289 Other specified depressive episodes: Secondary | ICD-10-CM | POA: Diagnosis not present

## 2018-02-20 DIAGNOSIS — Z48815 Encounter for surgical aftercare following surgery on the digestive system: Secondary | ICD-10-CM | POA: Diagnosis not present

## 2018-02-20 DIAGNOSIS — C61 Malignant neoplasm of prostate: Secondary | ICD-10-CM | POA: Diagnosis not present

## 2018-02-20 DIAGNOSIS — E559 Vitamin D deficiency, unspecified: Secondary | ICD-10-CM | POA: Diagnosis not present

## 2018-02-20 DIAGNOSIS — D519 Vitamin B12 deficiency anemia, unspecified: Secondary | ICD-10-CM | POA: Diagnosis not present

## 2018-02-20 DIAGNOSIS — I10 Essential (primary) hypertension: Secondary | ICD-10-CM | POA: Diagnosis not present

## 2018-02-20 DIAGNOSIS — I4891 Unspecified atrial fibrillation: Secondary | ICD-10-CM | POA: Diagnosis not present

## 2018-02-20 MED ORDER — LEUPROLIDE ACETATE (6 MONTH) 45 MG IM KIT
45.0000 mg | PACK | Freq: Once | INTRAMUSCULAR | Status: AC
  Administered 2018-02-20: 45 mg via INTRAMUSCULAR
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
## 2018-02-20 NOTE — Progress Notes (Signed)
Lupron IM Injection   Due to Prostate Cancer patient is present today for a Lupron Injection.  Medication: Lupron 6 month Dose: 45 mg  Location: left upper outer buttocks Lot: [id number] Exp: 05/20/2020  Patient tolerated well, no complications were noted  Performed by: Fonnie Jarvis, CMA  Follow up: 6 month

## 2018-02-21 ENCOUNTER — Ambulatory Visit (INDEPENDENT_AMBULATORY_CARE_PROVIDER_SITE_OTHER): Payer: Medicare Other | Admitting: Surgery

## 2018-02-21 ENCOUNTER — Encounter: Payer: Self-pay | Admitting: Surgery

## 2018-02-21 ENCOUNTER — Other Ambulatory Visit: Payer: Self-pay

## 2018-02-21 VITALS — BP 147/75 | HR 65 | Temp 93.2°F | Resp 13 | Ht 72.0 in | Wt 168.2 lb

## 2018-02-21 DIAGNOSIS — Z4889 Encounter for other specified surgical aftercare: Secondary | ICD-10-CM

## 2018-02-21 NOTE — Progress Notes (Signed)
S/p cholecystectomy w re exploration Doing well Continues to recover slowly He is walking with a walker and sometimes on his own. Taking p.o. but has lost more weight. No Fevers no chills no emesis.  PE NAD ABD: soft, incision healing well, small area 3 mm open, q tip used to probe. Fascia is intact, no abscess or infection. No FB No peritonitis. Dry dressing applied  A/p Doing well, improving slowly as expected given his age RTC 2 weeks Ensure TID No evidence of complications at this time

## 2018-02-22 DIAGNOSIS — Z48815 Encounter for surgical aftercare following surgery on the digestive system: Secondary | ICD-10-CM | POA: Diagnosis not present

## 2018-02-22 DIAGNOSIS — D519 Vitamin B12 deficiency anemia, unspecified: Secondary | ICD-10-CM | POA: Diagnosis not present

## 2018-02-22 DIAGNOSIS — E559 Vitamin D deficiency, unspecified: Secondary | ICD-10-CM | POA: Diagnosis not present

## 2018-02-22 DIAGNOSIS — I4891 Unspecified atrial fibrillation: Secondary | ICD-10-CM | POA: Diagnosis not present

## 2018-02-22 DIAGNOSIS — I10 Essential (primary) hypertension: Secondary | ICD-10-CM | POA: Diagnosis not present

## 2018-02-22 DIAGNOSIS — F3289 Other specified depressive episodes: Secondary | ICD-10-CM | POA: Diagnosis not present

## 2018-02-26 DIAGNOSIS — D519 Vitamin B12 deficiency anemia, unspecified: Secondary | ICD-10-CM | POA: Diagnosis not present

## 2018-02-26 DIAGNOSIS — F3289 Other specified depressive episodes: Secondary | ICD-10-CM | POA: Diagnosis not present

## 2018-02-26 DIAGNOSIS — Z48815 Encounter for surgical aftercare following surgery on the digestive system: Secondary | ICD-10-CM | POA: Diagnosis not present

## 2018-02-26 DIAGNOSIS — I10 Essential (primary) hypertension: Secondary | ICD-10-CM | POA: Diagnosis not present

## 2018-02-26 DIAGNOSIS — E559 Vitamin D deficiency, unspecified: Secondary | ICD-10-CM | POA: Diagnosis not present

## 2018-02-26 DIAGNOSIS — I4891 Unspecified atrial fibrillation: Secondary | ICD-10-CM | POA: Diagnosis not present

## 2018-02-27 ENCOUNTER — Telehealth: Payer: Self-pay | Admitting: Physician Assistant

## 2018-02-27 DIAGNOSIS — I4891 Unspecified atrial fibrillation: Secondary | ICD-10-CM | POA: Diagnosis not present

## 2018-02-27 DIAGNOSIS — Z48815 Encounter for surgical aftercare following surgery on the digestive system: Secondary | ICD-10-CM | POA: Diagnosis not present

## 2018-02-27 DIAGNOSIS — I10 Essential (primary) hypertension: Secondary | ICD-10-CM | POA: Diagnosis not present

## 2018-02-27 DIAGNOSIS — F3289 Other specified depressive episodes: Secondary | ICD-10-CM | POA: Diagnosis not present

## 2018-02-27 DIAGNOSIS — D519 Vitamin B12 deficiency anemia, unspecified: Secondary | ICD-10-CM | POA: Diagnosis not present

## 2018-02-27 DIAGNOSIS — E559 Vitamin D deficiency, unspecified: Secondary | ICD-10-CM | POA: Diagnosis not present

## 2018-02-27 NOTE — Telephone Encounter (Signed)
Charles Cook PTA w/ [facility] - [phone].  Alerting doctor pt's heart rate is in the 50's at rest today.  Thanks, [employer]

## 2018-02-27 NOTE — Telephone Encounter (Signed)
Tawanna Sat is out of the office, just a FYI. Please advise. KW

## 2018-02-28 NOTE — Telephone Encounter (Signed)
As long as pt asymptomatic just follow. If symptomatic he needs to be seen.

## 2018-02-28 NOTE — Telephone Encounter (Signed)
LMTCB 02/28/2018  Thanks,   -Mickel Baas

## 2018-03-01 NOTE — Telephone Encounter (Signed)
Left message to call back  

## 2018-03-02 ENCOUNTER — Telehealth: Payer: Self-pay | Admitting: Physician Assistant

## 2018-03-02 NOTE — Telephone Encounter (Signed)
Pt's Daughter returned missed call.  Please call her back to discuss pt.  She handles his calls now.  Thanks, [employer]

## 2018-03-05 NOTE — Telephone Encounter (Signed)
Left detailed message that we were just following up on [name] regarding his heart rate that Tommy with PTA had call to let the provider know.

## 2018-03-06 DIAGNOSIS — D519 Vitamin B12 deficiency anemia, unspecified: Secondary | ICD-10-CM | POA: Diagnosis not present

## 2018-03-06 DIAGNOSIS — I10 Essential (primary) hypertension: Secondary | ICD-10-CM | POA: Diagnosis not present

## 2018-03-06 DIAGNOSIS — E559 Vitamin D deficiency, unspecified: Secondary | ICD-10-CM | POA: Diagnosis not present

## 2018-03-06 DIAGNOSIS — F3289 Other specified depressive episodes: Secondary | ICD-10-CM | POA: Diagnosis not present

## 2018-03-06 DIAGNOSIS — I4891 Unspecified atrial fibrillation: Secondary | ICD-10-CM | POA: Diagnosis not present

## 2018-03-06 DIAGNOSIS — Z48815 Encounter for surgical aftercare following surgery on the digestive system: Secondary | ICD-10-CM | POA: Diagnosis not present

## 2018-03-07 ENCOUNTER — Ambulatory Visit (INDEPENDENT_AMBULATORY_CARE_PROVIDER_SITE_OTHER): Payer: Medicare Other | Admitting: Surgery

## 2018-03-07 ENCOUNTER — Other Ambulatory Visit: Payer: Self-pay

## 2018-03-07 ENCOUNTER — Encounter: Payer: Self-pay | Admitting: Surgery

## 2018-03-07 VITALS — BP 129/67 | HR 64 | Temp 97.4°F | Ht 69.0 in | Wt 167.0 lb

## 2018-03-07 DIAGNOSIS — Z09 Encounter for follow-up examination after completed treatment for conditions other than malignant neoplasm: Secondary | ICD-10-CM

## 2018-03-07 NOTE — Patient Instructions (Signed)
Patient to return as needed. The patient is aware to call back for any questions or concerns. 

## 2018-03-09 ENCOUNTER — Encounter: Payer: Self-pay | Admitting: Surgery

## 2018-03-09 ENCOUNTER — Other Ambulatory Visit: Payer: Self-pay

## 2018-03-09 DIAGNOSIS — Z8546 Personal history of malignant neoplasm of prostate: Secondary | ICD-10-CM

## 2018-03-09 NOTE — Progress Notes (Signed)
S/p open chole Doing well No fevers Taking po Ambulating   PE NAD Abd: soft, nt, incisions healed, no infection  A/P Doing well  No complications  RTC prn

## 2018-03-13 DIAGNOSIS — D519 Vitamin B12 deficiency anemia, unspecified: Secondary | ICD-10-CM | POA: Diagnosis not present

## 2018-03-13 DIAGNOSIS — I10 Essential (primary) hypertension: Secondary | ICD-10-CM | POA: Diagnosis not present

## 2018-03-13 DIAGNOSIS — I4891 Unspecified atrial fibrillation: Secondary | ICD-10-CM | POA: Diagnosis not present

## 2018-03-13 DIAGNOSIS — F3289 Other specified depressive episodes: Secondary | ICD-10-CM | POA: Diagnosis not present

## 2018-03-13 DIAGNOSIS — Z48815 Encounter for surgical aftercare following surgery on the digestive system: Secondary | ICD-10-CM | POA: Diagnosis not present

## 2018-03-13 DIAGNOSIS — E559 Vitamin D deficiency, unspecified: Secondary | ICD-10-CM | POA: Diagnosis not present

## 2018-03-14 DIAGNOSIS — F3289 Other specified depressive episodes: Secondary | ICD-10-CM | POA: Diagnosis not present

## 2018-03-14 DIAGNOSIS — Z48815 Encounter for surgical aftercare following surgery on the digestive system: Secondary | ICD-10-CM | POA: Diagnosis not present

## 2018-03-14 DIAGNOSIS — D519 Vitamin B12 deficiency anemia, unspecified: Secondary | ICD-10-CM | POA: Diagnosis not present

## 2018-03-14 DIAGNOSIS — I4891 Unspecified atrial fibrillation: Secondary | ICD-10-CM | POA: Diagnosis not present

## 2018-03-14 DIAGNOSIS — I10 Essential (primary) hypertension: Secondary | ICD-10-CM | POA: Diagnosis not present

## 2018-03-14 DIAGNOSIS — E559 Vitamin D deficiency, unspecified: Secondary | ICD-10-CM | POA: Diagnosis not present

## 2018-03-15 ENCOUNTER — Other Ambulatory Visit: Payer: Self-pay

## 2018-03-15 ENCOUNTER — Inpatient Hospital Stay: Payer: Medicare Other | Attending: Oncology | Admitting: Oncology

## 2018-03-15 ENCOUNTER — Encounter: Payer: Self-pay | Admitting: Oncology

## 2018-03-15 ENCOUNTER — Inpatient Hospital Stay: Payer: Medicare Other

## 2018-03-15 ENCOUNTER — Ambulatory Visit: Payer: Medicare Other | Admitting: Family Medicine

## 2018-03-15 VITALS — BP 139/64 | HR 59 | Temp 97.7°F | Resp 18 | Ht 69.0 in | Wt 168.8 lb

## 2018-03-15 DIAGNOSIS — C61 Malignant neoplasm of prostate: Secondary | ICD-10-CM | POA: Diagnosis not present

## 2018-03-15 DIAGNOSIS — Z79899 Other long term (current) drug therapy: Secondary | ICD-10-CM | POA: Diagnosis not present

## 2018-03-15 DIAGNOSIS — Z8546 Personal history of malignant neoplasm of prostate: Secondary | ICD-10-CM

## 2018-03-15 DIAGNOSIS — Z951 Presence of aortocoronary bypass graft: Secondary | ICD-10-CM | POA: Insufficient documentation

## 2018-03-15 DIAGNOSIS — Z8614 Personal history of Methicillin resistant Staphylococcus aureus infection: Secondary | ICD-10-CM | POA: Insufficient documentation

## 2018-03-15 DIAGNOSIS — R1012 Left upper quadrant pain: Secondary | ICD-10-CM

## 2018-03-15 DIAGNOSIS — C772 Secondary and unspecified malignant neoplasm of intra-abdominal lymph nodes: Secondary | ICD-10-CM

## 2018-03-15 DIAGNOSIS — K449 Diaphragmatic hernia without obstruction or gangrene: Secondary | ICD-10-CM | POA: Diagnosis not present

## 2018-03-15 DIAGNOSIS — I252 Old myocardial infarction: Secondary | ICD-10-CM | POA: Insufficient documentation

## 2018-03-15 DIAGNOSIS — I10 Essential (primary) hypertension: Secondary | ICD-10-CM | POA: Diagnosis not present

## 2018-03-15 DIAGNOSIS — I4891 Unspecified atrial fibrillation: Secondary | ICD-10-CM | POA: Diagnosis not present

## 2018-03-15 DIAGNOSIS — E785 Hyperlipidemia, unspecified: Secondary | ICD-10-CM | POA: Insufficient documentation

## 2018-03-15 DIAGNOSIS — M8580 Other specified disorders of bone density and structure, unspecified site: Secondary | ICD-10-CM | POA: Insufficient documentation

## 2018-03-15 DIAGNOSIS — M85859 Other specified disorders of bone density and structure, unspecified thigh: Secondary | ICD-10-CM

## 2018-03-15 DIAGNOSIS — N400 Enlarged prostate without lower urinary tract symptoms: Secondary | ICD-10-CM | POA: Insufficient documentation

## 2018-03-15 DIAGNOSIS — Z7901 Long term (current) use of anticoagulants: Secondary | ICD-10-CM | POA: Diagnosis not present

## 2018-03-15 DIAGNOSIS — I2510 Atherosclerotic heart disease of native coronary artery without angina pectoris: Secondary | ICD-10-CM | POA: Insufficient documentation

## 2018-03-15 DIAGNOSIS — Z7982 Long term (current) use of aspirin: Secondary | ICD-10-CM | POA: Diagnosis not present

## 2018-03-15 DIAGNOSIS — F329 Major depressive disorder, single episode, unspecified: Secondary | ICD-10-CM | POA: Diagnosis not present

## 2018-03-15 LAB — COMPREHENSIVE METABOLIC PANEL
ALT: 54 U/L — ABNORMAL HIGH (ref 0–44)
AST: 68 U/L — ABNORMAL HIGH (ref 15–41)
Albumin: 3.4 g/dL — ABNORMAL LOW (ref 3.5–5.0)
Alkaline Phosphatase: 83 U/L (ref 38–126)
Anion gap: 6 (ref 5–15)
BUN: 15 mg/dL (ref 8–23)
CO2: 27 mmol/L (ref 22–32)
Calcium: 9 mg/dL (ref 8.9–10.3)
Chloride: 102 mmol/L (ref 98–111)
Creatinine, Ser: 1.21 mg/dL (ref 0.61–1.24)
GFR calc Af Amer: >60 mL/min (ref >60)
GFR calc non Af Amer: 52 mL/min — ABNORMAL LOW (ref >60)
Glucose, Bld: 142 mg/dL — ABNORMAL HIGH (ref 70–99)
Potassium: 4.5 mmol/L (ref 3.5–5.1)
Sodium: 135 mmol/L (ref 135–145)
Total Bilirubin: 0.4 mg/dL (ref 0.3–1.2)
Total Protein: 6 g/dL — ABNORMAL LOW (ref 6.5–8.1)

## 2018-03-15 MED ORDER — DENOSUMAB 60 MG/ML ~~LOC~~ SOSY
60.0000 mg | PREFILLED_SYRINGE | Freq: Once | SUBCUTANEOUS | Status: AC
  Administered 2018-03-15: 60 mg via SUBCUTANEOUS
  Filled 2018-03-15: qty 1
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
## 2018-03-16 ENCOUNTER — Ambulatory Visit
Admission: RE | Admit: 2018-03-16 | Discharge: 2018-03-16 | Disposition: A | Discharge status: Home / Self Care | Payer: Medicare Other | Source: Ambulatory Visit | Attending: Physician Assistant | Admitting: Physician Assistant

## 2018-03-16 ENCOUNTER — Encounter: Payer: Self-pay | Admitting: Physician Assistant

## 2018-03-16 ENCOUNTER — Ambulatory Visit
Admission: RE | Admit: 2018-03-16 | Discharge: 2018-03-16 | Disposition: A | Discharge status: Home / Self Care | Payer: Medicare Other | Source: Ambulatory Visit | Attending: Oncology | Admitting: Oncology

## 2018-03-16 ENCOUNTER — Ambulatory Visit (INDEPENDENT_AMBULATORY_CARE_PROVIDER_SITE_OTHER): Payer: Medicare Other | Admitting: Physician Assistant

## 2018-03-16 VITALS — BP 130/60 | HR 60 | Temp 97.7°F | Resp 16 | Wt 167.0 lb

## 2018-03-16 DIAGNOSIS — C772 Secondary and unspecified malignant neoplasm of intra-abdominal lymph nodes: Secondary | ICD-10-CM | POA: Insufficient documentation

## 2018-03-16 DIAGNOSIS — M8580 Other specified disorders of bone density and structure, unspecified site: Secondary | ICD-10-CM

## 2018-03-16 DIAGNOSIS — R1012 Left upper quadrant pain: Secondary | ICD-10-CM | POA: Insufficient documentation

## 2018-03-16 DIAGNOSIS — R079 Chest pain, unspecified: Secondary | ICD-10-CM

## 2018-03-16 DIAGNOSIS — Z951 Presence of aortocoronary bypass graft: Secondary | ICD-10-CM | POA: Insufficient documentation

## 2018-03-16 DIAGNOSIS — R0781 Pleurodynia: Secondary | ICD-10-CM | POA: Diagnosis not present

## 2018-03-16 DIAGNOSIS — Z9049 Acquired absence of other specified parts of digestive tract: Secondary | ICD-10-CM | POA: Diagnosis not present

## 2018-03-16 DIAGNOSIS — C61 Malignant neoplasm of prostate: Secondary | ICD-10-CM | POA: Insufficient documentation

## 2018-03-16 DIAGNOSIS — S299XXA Unspecified injury of thorax, initial encounter: Secondary | ICD-10-CM | POA: Diagnosis not present

## 2018-03-16 DIAGNOSIS — K862 Cyst of pancreas: Secondary | ICD-10-CM | POA: Diagnosis not present

## 2018-03-16 DIAGNOSIS — R0989 Other specified symptoms and signs involving the circulatory and respiratory systems: Secondary | ICD-10-CM

## 2018-03-16 NOTE — Progress Notes (Signed)
Patient: Charles Cook Male    DOB: [birth year]   84 y.o.   MRN: [id number] Visit Date: 03/23/2018  Today's Provider: Trinna Post, PA-C   Chief Complaint  Patient presents with  . Fall   Subjective:    HPI   Patient has history of prostate cancer, currently treated with lupron, osteopenia treated with prolia, recent cholecystectomy complicated by blood loss and need for transfusion here today C/O soreness around left side of chest due to a fall a few weeks ago. Patient reports a catching sensation in his left side when he breathes sometimes. He denies fevers, chills, productive cough. He reports eating well, but has not this morning due to scheduled ultrasound. He saw his oncologist yesterday who ordered an abdominal ultrasound for left sided pain. He has worsening anemia over the past several months.     Allergies  Allergen Reactions  . Moxifloxacin Hcl In Nacl Other (See Comments)    Confusion      Current Outpatient Medications:  .  acetaminophen (TYLENOL) 500 MG tablet, Take 1,000 mg by mouth daily as needed for moderate pain or headache., Disp: , Rfl:  .  amiodarone (PACERONE) 200 MG tablet, Take 200 mg by mouth daily. , Disp: , Rfl:  .  apixaban (ELIQUIS) 2.5 MG TABS tablet, Take 1 tablet (2.5 mg total) by mouth 2 (two) times daily., Disp: 60 tablet, Rfl: 1 .  aspirin EC 81 MG tablet, Take 81 mg by mouth at bedtime., Disp: , Rfl:  .  Calcium Carbonate-Vitamin D (CALCIUM-D) 600-400 MG-UNIT TABS, Take 1 tablet by mouth 2 (two) times daily. , Disp: , Rfl:  .  Cyanocobalamin (VITAMIN B-12) 5000 MCG SUBL, Place 5,000 mcg under the tongue daily., Disp: , Rfl:  .  denosumab (PROLIA) 60 MG/ML SOSY injection, Inject 60 mg into the skin every 6 (six) months., Disp: , Rfl:  .  digoxin (DIGOX) 0.25 MG tablet, Take 0.5 tablets (0.125 mg total) by mouth daily. (Patient taking differently: Take 0.25 mg by mouth daily. ), Disp: 90 tablet, Rfl: 1 .  diphenhydrAMINE (BENADRYL) 25 MG  tablet, Take 25 mg by mouth at bedtime as needed for allergies. , Disp: , Rfl:  .  fluticasone (FLONASE) 50 MCG/ACT nasal spray, USE 2 SPRAYS IN EACH NOSTRIL DAILY, Disp: 48 g, Rfl: 1 .  Leuprolide Acetate, 6 Month, (LUPRON DEPOT, 82-MONTH, IM), Inject into the muscle every 6 (six) months., Disp: , Rfl:  .  metoprolol succinate (TOPROL-XL) 25 MG 24 hr tablet, Take 25 mg by mouth every evening. , Disp: , Rfl:  .  Multiple Vitamin (MULTIVITAMIN WITH MINERALS) TABS tablet, Take 1 tablet by mouth daily., Disp: , Rfl:  .  niacin (NIASPAN) 500 MG CR tablet, TAKE ONE TABLET BY MOUTH ONCE DAILY, Disp: 90 tablet, Rfl: 3 .  polyethylene glycol (MIRALAX / GLYCOLAX) packet, Take 17 g by mouth daily as needed for mild constipation., Disp: , Rfl:  .  sertraline (ZOLOFT) 50 MG tablet, Take 50 mg by mouth at bedtime. , Disp: , Rfl: 4 .  traZODone (DESYREL) 100 MG tablet, TAKE ONE TABLET BY MOUTH AT BEDTIME, Disp: 90 tablet, Rfl: 1  Review of Systems  Constitutional: Negative.   Respiratory: Negative.   Cardiovascular: Negative.     Social History   Tobacco Use  . Smoking status: Never Smoker  . Smokeless tobacco: Never Used  Substance Use Topics  . Alcohol use: Yes    Alcohol/week: 0.0 standard drinks  Comment: one glass in 6 months    Objective:   BP 130/60 (BP Location: Left Arm, Patient Position: Sitting, Cuff Size: Normal)   Pulse 60   Temp 97.7 F (36.5 C) (Oral)   Resp 16   Wt 167 lb (75.8 kg)   SpO2 96%   BMI 24.66 kg/m  Vitals:   03/16/18 0920  BP: 130/60  Pulse: 60  Resp: 16  Temp: 97.7 F (36.5 C)  TempSrc: Oral  SpO2: 96%  Weight: 167 lb (75.8 kg)     Physical Exam  Constitutional: He is oriented to person, place, and time. He appears well-developed. No distress.  Cardiovascular: Normal rate and regular rhythm.  Pulmonary/Chest: Effort normal. No respiratory distress. He has rales.  Crackles in left lower lobe.   Musculoskeletal:  Nontender to palpation over ribs,  no stepoffs appreciated.   Neurological: He is alert and oriented to person, place, and time.  Skin: Skin is warm and dry. There is pallor.  Psychiatric: He has a normal mood and affect. His behavior is normal.        Assessment & Plan:     1. Left-sided chest pain  Will get CXR and rib xray as below.   CXR revealed no pneumonia, rib xray showed old fractures. I have personally reviewed imaging and agree with findings.  - DG Chest 2 View; Future - DG Ribs Unilateral Left; Future  2. Lung crackles  - DG Chest 2 View; Future - DG Ribs Unilateral Left; Future  I have spent 25 minutes with this patient, >50% of which was spent on counseling and coordination of care.  Return if symptoms worsen or fail to improve.  The entirety of the information documented in the History of Present Illness, Review of Systems and Physical Exam were personally obtained by me. Portions of this information were initially documented by Lynford Humphrey, CMA and reviewed by me for thoroughness and accuracy.         Trinna Post, PA-C  [facility]

## 2018-03-16 NOTE — Patient Instructions (Signed)

## 2018-03-19 ENCOUNTER — Telehealth: Payer: Self-pay | Admitting: *Deleted

## 2018-03-19 ENCOUNTER — Other Ambulatory Visit: Payer: Medicare Other

## 2018-03-19 NOTE — Telephone Encounter (Signed)
Called daughter and let her know that the [place] did not show anything but simple cyst. I did tell her of his bone density appt 08/20/18 1:40 appt. She does use my chart so she will see it on website. Thanked me for calling

## 2018-03-19 NOTE — Telephone Encounter (Signed)
-----   Message from Sindy Guadeloupe, MD sent at 03/19/2018  7:51 AM EST ----- Please call daughter and inform her usg abdomen was unremarkable

## 2018-03-20 DIAGNOSIS — I10 Essential (primary) hypertension: Secondary | ICD-10-CM | POA: Diagnosis not present

## 2018-03-20 DIAGNOSIS — I4819 Other persistent atrial fibrillation: Secondary | ICD-10-CM | POA: Diagnosis not present

## 2018-03-20 DIAGNOSIS — I420 Dilated cardiomyopathy: Secondary | ICD-10-CM | POA: Diagnosis not present

## 2018-03-20 DIAGNOSIS — I25810 Atherosclerosis of coronary artery bypass graft(s) without angina pectoris: Secondary | ICD-10-CM | POA: Diagnosis not present

## 2018-03-20 DIAGNOSIS — K8020 Calculus of gallbladder without cholecystitis without obstruction: Secondary | ICD-10-CM | POA: Diagnosis not present

## 2018-03-21 ENCOUNTER — Other Ambulatory Visit: Payer: Self-pay

## 2018-03-21 DIAGNOSIS — F3289 Other specified depressive episodes: Secondary | ICD-10-CM | POA: Diagnosis not present

## 2018-03-21 DIAGNOSIS — Z48815 Encounter for surgical aftercare following surgery on the digestive system: Secondary | ICD-10-CM | POA: Diagnosis not present

## 2018-03-21 DIAGNOSIS — I10 Essential (primary) hypertension: Secondary | ICD-10-CM | POA: Diagnosis not present

## 2018-03-21 DIAGNOSIS — I4891 Unspecified atrial fibrillation: Secondary | ICD-10-CM | POA: Diagnosis not present

## 2018-03-21 DIAGNOSIS — E559 Vitamin D deficiency, unspecified: Secondary | ICD-10-CM | POA: Diagnosis not present

## 2018-03-21 DIAGNOSIS — D519 Vitamin B12 deficiency anemia, unspecified: Secondary | ICD-10-CM | POA: Diagnosis not present

## 2018-03-22 ENCOUNTER — Ambulatory Visit: Payer: Medicare Other

## 2018-03-27 ENCOUNTER — Telehealth: Payer: Self-pay

## 2018-03-27 NOTE — Telephone Encounter (Signed)
Called daughter to r/s pts AWV visit and she stated that [place] told her it was not necessary. Daughter stated that if they didn't have to complete this then they would prefer not to. FYI! -MM

## 2018-03-27 NOTE — Telephone Encounter (Signed)
Yes this is ok 

## 2018-04-30 ENCOUNTER — Other Ambulatory Visit: Payer: Self-pay | Admitting: Physician Assistant

## 2018-04-30 DIAGNOSIS — F5101 Primary insomnia: Secondary | ICD-10-CM

## 2018-05-01 ENCOUNTER — Other Ambulatory Visit: Payer: Self-pay | Admitting: Physician Assistant

## 2018-05-01 MED ORDER — AMIODARONE HCL 200 MG PO TABS: 200.0000 mg | ORAL_TABLET | Freq: Every day | ORAL | 0 refills | Status: DC

## 2018-05-01 MED ORDER — METOPROLOL SUCCINATE ER 25 MG PO TB24: 25.0000 mg | ORAL_TABLET | Freq: Every evening | ORAL | 0 refills | Status: DC

## 2018-05-01 NOTE — Telephone Encounter (Signed)
Please review for Jenni.   Thanks,   -Taylorann Tkach  

## 2018-05-01 NOTE — Telephone Encounter (Signed)
Pharmacy requesting refill of amiodarone (PACERONE) 200 MG tablet 90 day supply

## 2018-05-01 NOTE — Telephone Encounter (Signed)
Also requesting metoprolol succinate (TOPROL-XL) 25 MG 24 hr tablet

## 2018-07-02 DIAGNOSIS — I4819 Other persistent atrial fibrillation: Secondary | ICD-10-CM | POA: Diagnosis not present

## 2018-07-02 DIAGNOSIS — I420 Dilated cardiomyopathy: Secondary | ICD-10-CM | POA: Diagnosis not present

## 2018-07-05 ENCOUNTER — Other Ambulatory Visit: Payer: Self-pay | Admitting: Family Medicine

## 2018-07-05 ENCOUNTER — Other Ambulatory Visit: Payer: Self-pay | Admitting: Physician Assistant

## 2018-08-20 ENCOUNTER — Other Ambulatory Visit: Payer: Medicare Other

## 2018-08-21 ENCOUNTER — Other Ambulatory Visit: Payer: Self-pay

## 2018-08-21 ENCOUNTER — Ambulatory Visit (INDEPENDENT_AMBULATORY_CARE_PROVIDER_SITE_OTHER): Payer: Medicare Other

## 2018-08-21 VITALS — BP 111/75 | HR 65 | Ht 72.0 in | Wt 175.0 lb

## 2018-08-21 DIAGNOSIS — C61 Malignant neoplasm of prostate: Secondary | ICD-10-CM

## 2018-08-21 MED ORDER — LEUPROLIDE ACETATE (6 MONTH) 45 MG IM KIT
45.0000 mg | PACK | Freq: Once | INTRAMUSCULAR | Status: AC
  Administered 2018-08-21: 45 mg via INTRAMUSCULAR

## 2018-08-21 NOTE — Patient Instructions (Signed)

## 2018-08-21 NOTE — Progress Notes (Signed)
Lupron IM Injection   Due to Prostate Cancer patient is present today for a Lupron Injection.  Medication: Lupron 6 month Dose: 45 mg  Location: right upper outer buttocks Lot: [id number] Exp: 10/01/2020  Patient tolerated well, no complications were noted  Performed by: Shawnie Dapper, CMA  Follow up: 6 month

## 2018-08-29 ENCOUNTER — Telehealth: Payer: Self-pay | Admitting: *Deleted

## 2018-08-29 NOTE — Telephone Encounter (Signed)
Patient daughter [name] called asking if patient should keep appointment for injection or wait until after the quarantine is over. Please advise

## 2018-08-30 NOTE — Telephone Encounter (Signed)
We can push him out 3 months

## 2018-09-01 NOTE — Telephone Encounter (Signed)
I called daughter and changed the appt and sent the appts in the mail on 08/31/2018

## 2018-09-13 ENCOUNTER — Ambulatory Visit: Payer: Medicare Other

## 2018-09-13 ENCOUNTER — Other Ambulatory Visit: Payer: Medicare Other

## 2018-09-13 ENCOUNTER — Ambulatory Visit: Payer: Medicare Other | Admitting: Oncology

## 2018-10-09 ENCOUNTER — Other Ambulatory Visit: Payer: Self-pay | Admitting: Family Medicine

## 2018-10-09 DIAGNOSIS — F5101 Primary insomnia: Secondary | ICD-10-CM

## 2018-10-11 ENCOUNTER — Ambulatory Visit
Admission: RE | Admit: 2018-10-11 | Discharge: 2018-10-11 | Disposition: A | Discharge status: Home / Self Care | Payer: Medicare Other | Source: Ambulatory Visit | Attending: Oncology | Admitting: Oncology

## 2018-10-11 ENCOUNTER — Other Ambulatory Visit: Payer: Self-pay

## 2018-10-11 DIAGNOSIS — Z79899 Other long term (current) drug therapy: Secondary | ICD-10-CM | POA: Diagnosis not present

## 2018-10-11 DIAGNOSIS — C772 Secondary and unspecified malignant neoplasm of intra-abdominal lymph nodes: Secondary | ICD-10-CM | POA: Diagnosis not present

## 2018-10-11 DIAGNOSIS — M8580 Other specified disorders of bone density and structure, unspecified site: Secondary | ICD-10-CM

## 2018-10-11 DIAGNOSIS — M85851 Other specified disorders of bone density and structure, right thigh: Secondary | ICD-10-CM | POA: Insufficient documentation

## 2018-10-11 DIAGNOSIS — Z13820 Encounter for screening for osteoporosis: Secondary | ICD-10-CM | POA: Diagnosis not present

## 2018-10-11 DIAGNOSIS — C61 Malignant neoplasm of prostate: Secondary | ICD-10-CM | POA: Insufficient documentation

## 2018-10-24 ENCOUNTER — Telehealth: Payer: Self-pay | Admitting: Physician Assistant

## 2018-10-24 NOTE — Chronic Care Management (AMB) (Signed)
°  Chronic Care Management   Outreach Note  10/24/2018 Name: Charles Cook MRN: [id number] DOB: 1930/01/10  Referred by: Mar Daring, PA-C Reason for referral : No chief complaint on file.   An unsuccessful telephone outreach was attempted today. The patient was referred to the case management team by for assistance with chronic care management and care coordination.   Follow Up Plan: A HIPPA compliant phone message was left for the patient providing contact information and requesting a return call.  The care management team will reach out to the patient again over the next 7 days.  If patient returns call to provider office, please advise to call [place] [facility] [place]  [email or web address]   ??[phone]

## 2018-10-30 NOTE — Chronic Care Management (AMB) (Signed)
°  Chronic Care Management   Outreach Note  10/30/2018 Name: Charles Cook MRN: [id number] DOB: 06-04-1929  Referred by: Mar Daring, PA-C Reason for referral : Chronic Care Management (Initial CCM outreach ) and Chronic Care Management (Second CCm outreach was unsuccessful)   A second unsuccessful telephone outreach was attempted today. The patient was referred to the case management team for assistance with chronic care management and care coordination.   Follow Up Plan: A HIPPA compliant phone message was left for the patient providing contact information and requesting a return call.  The care management team will reach out to the patient again over the next 7 days.  If patient returns call to provider office, please advise to call [place] at [place]  [email or web address]   ??[phone]

## 2018-11-09 NOTE — Chronic Care Management (AMB) (Signed)
°  Chronic Care Management   Outreach Note  11/09/2018 Name: Charles Cook MRN: [id number] DOB: 08/03/1929  Referred by: Mar Daring, PA-C Reason for referral : Chronic Care Management (Initial CCM outreach ), Chronic Care Management (Second CCM outreach was unsuccessful), and Chronic Care Management (Third CCM outreach )   Third unsuccessful telephone outreach was attempted today. The patient was referred to the case management team for assistance with chronic care management and care coordination. The patient's primary care provider has been notified of our unsuccessful attempts to make or maintain contact with the patient. The care management team is pleased to engage with this patient at any time in the future should he/she be interested in assistance from the care management team.   Follow Up Plan: The care management team is available to follow up with the patient after provider conversation with the patient regarding recommendation for care management engagement and subsequent re-referral to the care management team.   [place]  [email or web address]   ??[phone]

## 2018-11-24 ENCOUNTER — Other Ambulatory Visit: Payer: Self-pay

## 2018-11-24 ENCOUNTER — Emergency Department
Admission: EM | Admit: 2018-11-24 | Discharge: 2018-11-24 | Disposition: A | Discharge status: Home / Self Care | Payer: Medicare Other | Attending: Emergency Medicine | Admitting: Emergency Medicine

## 2018-11-24 ENCOUNTER — Emergency Department: Payer: Medicare Other

## 2018-11-24 DIAGNOSIS — S0990XA Unspecified injury of head, initial encounter: Secondary | ICD-10-CM | POA: Insufficient documentation

## 2018-11-24 DIAGNOSIS — I10 Essential (primary) hypertension: Secondary | ICD-10-CM | POA: Diagnosis not present

## 2018-11-24 DIAGNOSIS — Y998 Other external cause status: Secondary | ICD-10-CM | POA: Diagnosis not present

## 2018-11-24 DIAGNOSIS — Y92009 Unspecified place in unspecified non-institutional (private) residence as the place of occurrence of the external cause: Secondary | ICD-10-CM | POA: Insufficient documentation

## 2018-11-24 DIAGNOSIS — I2510 Atherosclerotic heart disease of native coronary artery without angina pectoris: Secondary | ICD-10-CM | POA: Diagnosis not present

## 2018-11-24 DIAGNOSIS — R220 Localized swelling, mass and lump, head: Secondary | ICD-10-CM | POA: Diagnosis not present

## 2018-11-24 DIAGNOSIS — Y9389 Activity, other specified: Secondary | ICD-10-CM | POA: Diagnosis not present

## 2018-11-24 DIAGNOSIS — I429 Cardiomyopathy, unspecified: Secondary | ICD-10-CM | POA: Diagnosis not present

## 2018-11-24 DIAGNOSIS — W19XXXA Unspecified fall, initial encounter: Secondary | ICD-10-CM | POA: Diagnosis not present

## 2018-11-24 DIAGNOSIS — I4891 Unspecified atrial fibrillation: Secondary | ICD-10-CM | POA: Insufficient documentation

## 2018-11-24 DIAGNOSIS — E785 Hyperlipidemia, unspecified: Secondary | ICD-10-CM | POA: Insufficient documentation
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
## 2018-11-24 NOTE — ED Notes (Signed)
Pt in the hallway - discharge printed out and pt signed.

## 2018-11-24 NOTE — ED Triage Notes (Signed)
Pt has hx a fib and on Eliquis per pt hx.

## 2018-11-24 NOTE — ED Triage Notes (Signed)
Pt presents via POV c/o fall yesterday. Reports hit head on shower. Denies LOC. A&Ox4.

## 2018-11-27 ENCOUNTER — Other Ambulatory Visit: Payer: Self-pay | Admitting: Oncology

## 2018-11-29 ENCOUNTER — Inpatient Hospital Stay: Payer: Medicare Other

## 2018-11-29 ENCOUNTER — Other Ambulatory Visit: Payer: Self-pay

## 2018-11-29 ENCOUNTER — Inpatient Hospital Stay (HOSPITAL_BASED_OUTPATIENT_CLINIC_OR_DEPARTMENT_OTHER): Payer: Medicare Other | Admitting: Oncology

## 2018-11-29 ENCOUNTER — Inpatient Hospital Stay: Payer: Medicare Other | Attending: Oncology

## 2018-11-29 ENCOUNTER — Encounter: Payer: Self-pay | Admitting: Oncology

## 2018-11-29 VITALS — BP 157/77 | HR 51 | Temp 96.0°F | Resp 18 | Wt 162.9 lb

## 2018-11-29 DIAGNOSIS — Z8546 Personal history of malignant neoplasm of prostate: Secondary | ICD-10-CM | POA: Diagnosis not present

## 2018-11-29 DIAGNOSIS — Z79899 Other long term (current) drug therapy: Secondary | ICD-10-CM | POA: Insufficient documentation

## 2018-11-29 DIAGNOSIS — M8580 Other specified disorders of bone density and structure, unspecified site: Secondary | ICD-10-CM | POA: Diagnosis not present

## 2018-11-29 DIAGNOSIS — M85859 Other specified disorders of bone density and structure, unspecified thigh: Secondary | ICD-10-CM

## 2018-11-29 DIAGNOSIS — Z7983 Long term (current) use of bisphosphonates: Secondary | ICD-10-CM

## 2018-11-29 DIAGNOSIS — C61 Malignant neoplasm of prostate: Secondary | ICD-10-CM

## 2018-11-29 LAB — COMPREHENSIVE METABOLIC PANEL
ALT: 48 U/L — ABNORMAL HIGH (ref 0–44)
AST: 55 U/L — ABNORMAL HIGH (ref 15–41)
Albumin: 3.5 g/dL (ref 3.5–5.0)
Alkaline Phosphatase: 69 U/L (ref 38–126)
Anion gap: 10 (ref 5–15)
BUN: 10 mg/dL (ref 8–23)
CO2: 27 mmol/L (ref 22–32)
Calcium: 8.8 mg/dL — ABNORMAL LOW (ref 8.9–10.3)
Chloride: 92 mmol/L — ABNORMAL LOW (ref 98–111)
Creatinine, Ser: 1.15 mg/dL (ref 0.61–1.24)
GFR calc Af Amer: >60 mL/min (ref >60)
GFR calc non Af Amer: 57 mL/min — ABNORMAL LOW (ref >60)
Glucose, Bld: 154 mg/dL — ABNORMAL HIGH (ref 70–99)
Potassium: 4.5 mmol/L (ref 3.5–5.1)
Sodium: 129 mmol/L — ABNORMAL LOW (ref 135–145)
Total Bilirubin: 0.6 mg/dL (ref 0.3–1.2)
Total Protein: 6.3 g/dL — ABNORMAL LOW (ref 6.5–8.1)

## 2018-11-29 MED ORDER — DENOSUMAB 60 MG/ML ~~LOC~~ SOSY
60.0000 mg | PREFILLED_SYRINGE | Freq: Once | SUBCUTANEOUS | Status: AC
  Administered 2018-11-29: 60 mg via SUBCUTANEOUS
  Filled 2018-11-29: qty 1

## 2018-11-29 MED ORDER — SODIUM CHLORIDE 0.9 % IV SOLN
Freq: Once | INTRAVENOUS | Status: DC
  Filled 2018-11-29: qty 250
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
## 2018-12-29 ENCOUNTER — Other Ambulatory Visit: Payer: Self-pay | Admitting: Physician Assistant

## 2019-01-01 ENCOUNTER — Telehealth: Payer: Self-pay | Admitting: Physician Assistant

## 2019-01-01 DIAGNOSIS — I10 Essential (primary) hypertension: Secondary | ICD-10-CM | POA: Diagnosis not present

## 2019-01-01 DIAGNOSIS — R000 Tachycardia, unspecified: Secondary | ICD-10-CM | POA: Diagnosis not present

## 2019-01-01 DIAGNOSIS — E782 Mixed hyperlipidemia: Secondary | ICD-10-CM | POA: Diagnosis not present

## 2019-01-01 DIAGNOSIS — I25810 Atherosclerosis of coronary artery bypass graft(s) without angina pectoris: Secondary | ICD-10-CM | POA: Diagnosis not present

## 2019-01-01 DIAGNOSIS — I4819 Other persistent atrial fibrillation: Secondary | ICD-10-CM | POA: Diagnosis not present

## 2019-01-01 DIAGNOSIS — I420 Dilated cardiomyopathy: Secondary | ICD-10-CM | POA: Diagnosis not present

## 2019-01-01 NOTE — Telephone Encounter (Signed)
Pt's daughter Aldrick Averill [id number]  Dr. Ubaldo Glassing - Cardiologist from today's visit has discontinued  metoprolol succinate (TOPROL-XL) 25 MG 24 hr tablet - therapy has been completed.  Has also changed Digoxin from 250 mcg to 125 mcg.  FYI for [place].  Thanks, [employer]

## 2019-01-01 NOTE — Telephone Encounter (Signed)
Updated medications
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
## 2019-01-24 ENCOUNTER — Ambulatory Visit (INDEPENDENT_AMBULATORY_CARE_PROVIDER_SITE_OTHER): Payer: Medicare Other | Admitting: Physician Assistant

## 2019-01-24 ENCOUNTER — Encounter: Payer: Self-pay | Admitting: Physician Assistant

## 2019-01-24 ENCOUNTER — Other Ambulatory Visit: Payer: Self-pay

## 2019-01-24 VITALS — BP 161/74 | HR 70 | Temp 97.8°F | Resp 16 | Ht 72.0 in | Wt 158.4 lb

## 2019-01-24 DIAGNOSIS — E7800 Pure hypercholesterolemia, unspecified: Secondary | ICD-10-CM

## 2019-01-24 DIAGNOSIS — R7309 Other abnormal glucose: Secondary | ICD-10-CM | POA: Insufficient documentation

## 2019-01-24 DIAGNOSIS — Z23 Encounter for immunization: Secondary | ICD-10-CM

## 2019-01-24 DIAGNOSIS — I4819 Other persistent atrial fibrillation: Secondary | ICD-10-CM

## 2019-01-24 DIAGNOSIS — H6123 Impacted cerumen, bilateral: Secondary | ICD-10-CM

## 2019-01-24 DIAGNOSIS — Z0000 Encounter for general adult medical examination without abnormal findings: Secondary | ICD-10-CM | POA: Diagnosis not present

## 2019-01-24 DIAGNOSIS — K5904 Chronic idiopathic constipation: Secondary | ICD-10-CM | POA: Insufficient documentation

## 2019-01-24 DIAGNOSIS — I10 Essential (primary) hypertension: Secondary | ICD-10-CM

## 2019-01-24 DIAGNOSIS — I420 Dilated cardiomyopathy: Secondary | ICD-10-CM | POA: Diagnosis not present

## 2019-01-24 NOTE — Patient Instructions (Signed)
Health Maintenance After Age 83 After age 83, you are at a higher risk for certain long-term diseases and infections as well as injuries from falls. Falls are a major cause of broken bones and head injuries in people who are older than age 83. Getting regular preventive care can help to keep you healthy and well. Preventive care includes getting regular testing and making lifestyle changes as recommended by your health care provider. Talk with your health care provider about:  Which screenings and tests you should have. A screening is a test that checks for a disease when you have no symptoms.  A diet and exercise plan that is right for you. What should I know about screenings and tests to prevent falls? Screening and testing are the best ways to find a health problem early. Early diagnosis and treatment give you the best chance of managing medical conditions that are common after age 83. Certain conditions and lifestyle choices may make you more likely to have a fall. Your health care provider may recommend:  Regular vision checks. Poor vision and conditions such as cataracts can make you more likely to have a fall. If you wear glasses, make sure to get your prescription updated if your vision changes.  Medicine review. Work with your health care provider to regularly review all of the medicines you are taking, including over-the-counter medicines. Ask your health care provider about any side effects that may make you more likely to have a fall. Tell your health care provider if any medicines that you take make you feel dizzy or sleepy.  Osteoporosis screening. Osteoporosis is a condition that causes the bones to get weaker. This can make the bones weak and cause them to break more easily.  Blood pressure screening. Blood pressure changes and medicines to control blood pressure can make you feel dizzy.  Strength and balance checks. Your health care provider may recommend certain tests to check your  strength and balance while standing, walking, or changing positions.  Foot health exam. Foot pain and numbness, as well as not wearing proper footwear, can make you more likely to have a fall.  Depression screening. You may be more likely to have a fall if you have a fear of falling, feel emotionally low, or feel unable to do activities that you used to do.  Alcohol use screening. Using too much alcohol can affect your balance and may make you more likely to have a fall. What actions can I take to lower my risk of falls? General instructions  Talk with your health care provider about your risks for falling. Tell your health care provider if: ? You fall. Be sure to tell your health care provider about all falls, even ones that seem minor. ? You feel dizzy, sleepy, or off-balance.  Take over-the-counter and prescription medicines only as told by your health care provider. These include any supplements.  Eat a healthy diet and maintain a healthy weight. A healthy diet includes low-fat dairy products, low-fat (lean) meats, and fiber from whole grains, beans, and lots of fruits and vegetables. Home safety  Remove any tripping hazards, such as rugs, cords, and clutter.  Install safety equipment such as grab bars in bathrooms and safety rails on stairs.  Keep rooms and walkways well-lit. Activity   Follow a regular exercise program to stay fit. This will help you maintain your balance. Ask your health care provider what types of exercise are appropriate for you.  If you need a cane or   walker, use it as recommended by your health care provider.  Wear supportive shoes that have nonskid soles. Lifestyle  Do not drink alcohol if your health care provider tells you not to drink.  If you drink alcohol, limit how much you have: ? 0-1 drink a day for women. ? 0-2 drinks a day for men.  Be aware of how much alcohol is in your drink. In the [place] one drink equals one typical bottle of beer (12  oz), one-half glass of wine (5 oz), or one shot of hard liquor (1 oz).  Do not use any products that contain nicotine or tobacco, such as cigarettes and e-cigarettes. If you need help quitting, ask your health care provider. Summary  Having a healthy lifestyle and getting preventive care can help to protect your health and wellness after age 83.  Screening and testing are the best way to find a health problem early and help you avoid having a fall. Early diagnosis and treatment give you the best chance for managing medical conditions that are more common for people who are older than age 83.  Falls are a major cause of broken bones and head injuries in people who are older than age 83. Take precautions to prevent a fall at home.  Work with your health care provider to learn what changes you can make to improve your health and wellness and to prevent falls. This information is not intended to replace advice given to you by your health care provider. Make sure you discuss any questions you have with your health care provider. Document Released: 03/01/2017 Document Revised: 08/09/2018 Document Reviewed: 03/01/2017 Elsevier Patient Education  2020 Elsevier Inc.  

## 2019-01-25 DIAGNOSIS — E7800 Pure hypercholesterolemia, unspecified: Secondary | ICD-10-CM | POA: Diagnosis not present

## 2019-01-25 DIAGNOSIS — R7309 Other abnormal glucose: Secondary | ICD-10-CM | POA: Diagnosis not present

## 2019-01-25 DIAGNOSIS — I4819 Other persistent atrial fibrillation: Secondary | ICD-10-CM | POA: Diagnosis not present

## 2019-01-25 DIAGNOSIS — I10 Essential (primary) hypertension: Secondary | ICD-10-CM | POA: Diagnosis not present

## 2019-01-25 DIAGNOSIS — I420 Dilated cardiomyopathy: Secondary | ICD-10-CM | POA: Diagnosis not present

## 2019-01-26 LAB — CBC WITH DIFFERENTIAL/PLATELET
Basophils Absolute: 0 10*3/uL (ref 0.0–0.2)
Basos: 0 %
EOS (ABSOLUTE): 0.6 10*3/uL — ABNORMAL HIGH (ref 0.0–0.4)
Eos: 7 %
Hematocrit: 37.3 % — ABNORMAL LOW (ref 37.5–51.0)
Hemoglobin: 12.5 g/dL — ABNORMAL LOW (ref 13.0–17.7)
Immature Grans (Abs): 0 10*3/uL (ref 0.0–0.1)
Immature Granulocytes: 0 %
Lymphocytes Absolute: 1 10*3/uL (ref 0.7–3.1)
Lymphs: 13 %
MCH: 32.4 pg (ref 26.6–33.0)
MCHC: 33.5 g/dL (ref 31.5–35.7)
MCV: 97 fL (ref 79–97)
Monocytes Absolute: 0.9 10*3/uL (ref 0.1–0.9)
Monocytes: 11 %
Neutrophils Absolute: 5.6 10*3/uL (ref 1.4–7.0)
Neutrophils: 69 %
Platelets: 219 10*3/uL (ref 150–450)
RBC: 3.86 x10E6/uL — ABNORMAL LOW (ref 4.14–5.80)
RDW: 12.5 % (ref 11.6–15.4)
WBC: 8.2 10*3/uL (ref 3.4–10.8)

## 2019-01-26 LAB — COMPREHENSIVE METABOLIC PANEL
ALT: 80 IU/L — ABNORMAL HIGH (ref 0–44)
AST: 73 IU/L — ABNORMAL HIGH (ref 0–40)
Albumin/Globulin Ratio: 2.2 (ref 1.2–2.2)
Albumin: 3.7 g/dL (ref 3.6–4.6)
Alkaline Phosphatase: 93 IU/L (ref 39–117)
BUN/Creatinine Ratio: 9 — ABNORMAL LOW (ref 10–24)
BUN: 9 mg/dL (ref 8–27)
Bilirubin Total: 0.4 mg/dL (ref 0.0–1.2)
CO2: 24 mmol/L (ref 20–29)
Calcium: 8.9 mg/dL (ref 8.6–10.2)
Chloride: 97 mmol/L (ref 96–106)
Creatinine, Ser: 0.97 mg/dL (ref 0.76–1.27)
GFR calc Af Amer: 81 mL/min/{1.73_m2} (ref >59)
GFR calc non Af Amer: 70 mL/min/{1.73_m2} (ref >59)
Globulin, Total: 1.7 g/dL (ref 1.5–4.5)
Glucose: 102 mg/dL — ABNORMAL HIGH (ref 65–99)
Potassium: 4.5 mmol/L (ref 3.5–5.2)
Sodium: 135 mmol/L (ref 134–144)
Total Protein: 5.4 g/dL — ABNORMAL LOW (ref 6.0–8.5)

## 2019-01-26 LAB — LIPID PANEL
Chol/HDL Ratio: 2.8 ratio (ref 0.0–5.0)
Cholesterol, Total: 206 mg/dL — ABNORMAL HIGH (ref 100–199)
HDL: 73 mg/dL (ref >39)
LDL Chol Calc (NIH): 117 mg/dL — ABNORMAL HIGH (ref 0–99)
Triglycerides: 89 mg/dL (ref 0–149)
VLDL Cholesterol Cal: 16 mg/dL (ref 5–40)

## 2019-01-26 LAB — HEMOGLOBIN A1C
Est. average glucose Bld gHb Est-mCnc: 117 mg/dL
Hgb A1c MFr Bld: 5.7 % — ABNORMAL HIGH (ref 4.8–5.6)

## 2019-01-30 ENCOUNTER — Telehealth: Payer: Self-pay

## 2019-01-30 NOTE — Telephone Encounter (Signed)
-----   Message from Mar Daring, [place] sent at 01/30/2019 11:04 AM EDT ----- Hemoglobin has improved greatly from last check a year ago. Blood count is essentially stable. Kidney function is normal. Liver enzymes have increased compared to last year. Limit tylenol (acetaminophen) based products best you can, limit fatty foods, and limit alcohol. Protein is also borderline low. Increase protein in diet, can use protein shakes to increase if needed. Cholesterol has increased just slightly compared to last year. Your HDL (good) cholesterol is high though and offers cardioprotection. I do not think it is necessary to start a cholesterol lowering medication at this time. Also of note this was not a fasting lab, which also will increase the numbers. A1c/sugar are borderline high at 5.7. Limit sugars.

## 2019-01-30 NOTE — Telephone Encounter (Signed)
Pt's daughter (on [place]) advised.    Thanks,   -Mickel Baas

## 2019-02-21 ENCOUNTER — Ambulatory Visit (INDEPENDENT_AMBULATORY_CARE_PROVIDER_SITE_OTHER): Payer: Medicare Other | Admitting: *Deleted

## 2019-02-21 ENCOUNTER — Other Ambulatory Visit: Payer: Self-pay

## 2019-02-21 DIAGNOSIS — C61 Malignant neoplasm of prostate: Secondary | ICD-10-CM | POA: Diagnosis not present

## 2019-02-21 MED ORDER — LEUPROLIDE ACETATE (6 MONTH) 45 MG ~~LOC~~ KIT
45.0000 mg | PACK | Freq: Once | SUBCUTANEOUS | Status: AC
  Administered 2019-02-21: 45 mg via SUBCUTANEOUS

## 2019-02-21 NOTE — Progress Notes (Signed)
Eligard SubQ Injection   Due to Prostate Cancer patient is present today for a Eligard Injection.  Medication: Eligard 6 month Dose: 45 mg  Location: right abdominal tissue Lot: [id number] Exp: 09/30/2019   Patient tolerated well, no complications were noted  Performed by: Verlene Mayer, CMA  Follow up: 6 months

## 2019-03-07 DIAGNOSIS — L723 Sebaceous cyst: Secondary | ICD-10-CM | POA: Diagnosis not present

## 2019-03-15 ENCOUNTER — Telehealth: Payer: Self-pay | Admitting: *Deleted

## 2019-03-15 NOTE — Telephone Encounter (Signed)
Called and spoke with daughter.  He had his first fall on Wednesday where he fell back in the bathroom and landed on his bottom. He did not hit his head or get injured. Having some soreness to his tail bone but otherwise ok. He had been without his home healthcare worker on Wednesday and may have not been eating or drinking like he normally does.   On Wednesday night they think he may have taken some Nyquil with his Trazodone for a slight cough he had. He does not measure and just took a "swig" of Nyquil. On Thursday He was very off balance. He fell two more times with the healthcare worker. She eventually put him back in bed and let him sleep longer. When he got up later Thursday he was a little better and she kept making him drink fluids. She called Benjamine Mola and told them he should not stay home alone. Benjamine Mola went and picked him up and is letting him stay with her instead. She states today he has been drinking better and eating more and is getting a little stronger and has not been off balance or fallen today. He is ambulating with his walker for safety. Vitals were good.   They are going to monitor him all weekend and keep him at her house so they can push fluids for him. She will call or go to ER if he worsens again.

## 2019-03-15 NOTE — Telephone Encounter (Signed)
Patient's daughter Benjamine Mola called for advise from [place]. Patient fell 3 times yesterday with no injuries. Benjamine Mola states patient is very weak and is having trouble ambulating. Home health nurse took pt's vitals this morning. Bp=121/66, O2=97, HR=66, R =16. Benjamine Mola would like a call back at [phone].

## 2019-03-15 NOTE — Telephone Encounter (Signed)
Please Review

## 2019-04-11 ENCOUNTER — Other Ambulatory Visit: Payer: Self-pay | Admitting: Family Medicine

## 2019-04-11 DIAGNOSIS — F5101 Primary insomnia: Secondary | ICD-10-CM

## 2019-05-28 DIAGNOSIS — R251 Tremor, unspecified: Secondary | ICD-10-CM | POA: Diagnosis not present

## 2019-05-28 DIAGNOSIS — I10 Essential (primary) hypertension: Secondary | ICD-10-CM | POA: Diagnosis not present

## 2019-05-28 DIAGNOSIS — I25810 Atherosclerosis of coronary artery bypass graft(s) without angina pectoris: Secondary | ICD-10-CM | POA: Diagnosis not present

## 2019-05-28 DIAGNOSIS — E782 Mixed hyperlipidemia: Secondary | ICD-10-CM | POA: Diagnosis not present

## 2019-05-28 DIAGNOSIS — I4819 Other persistent atrial fibrillation: Secondary | ICD-10-CM | POA: Diagnosis not present

## 2019-05-28 DIAGNOSIS — I420 Dilated cardiomyopathy: Secondary | ICD-10-CM | POA: Diagnosis not present

## 2019-06-03 ENCOUNTER — Inpatient Hospital Stay: Payer: Medicare Other

## 2019-06-03 ENCOUNTER — Inpatient Hospital Stay: Payer: Medicare Other | Attending: Oncology

## 2019-06-03 ENCOUNTER — Encounter: Payer: Self-pay | Admitting: Oncology

## 2019-06-03 ENCOUNTER — Inpatient Hospital Stay (HOSPITAL_BASED_OUTPATIENT_CLINIC_OR_DEPARTMENT_OTHER): Payer: Medicare Other | Admitting: Oncology

## 2019-06-03 ENCOUNTER — Other Ambulatory Visit: Payer: Self-pay

## 2019-06-03 VITALS — BP 147/56 | HR 72 | Temp 95.3°F | Resp 18 | Wt 159.1 lb

## 2019-06-03 DIAGNOSIS — Z8042 Family history of malignant neoplasm of prostate: Secondary | ICD-10-CM | POA: Insufficient documentation

## 2019-06-03 DIAGNOSIS — I252 Old myocardial infarction: Secondary | ICD-10-CM | POA: Insufficient documentation

## 2019-06-03 DIAGNOSIS — I10 Essential (primary) hypertension: Secondary | ICD-10-CM | POA: Diagnosis not present

## 2019-06-03 DIAGNOSIS — Z833 Family history of diabetes mellitus: Secondary | ICD-10-CM | POA: Diagnosis not present

## 2019-06-03 DIAGNOSIS — Z808 Family history of malignant neoplasm of other organs or systems: Secondary | ICD-10-CM | POA: Diagnosis not present

## 2019-06-03 DIAGNOSIS — C61 Malignant neoplasm of prostate: Secondary | ICD-10-CM | POA: Insufficient documentation

## 2019-06-03 DIAGNOSIS — I2510 Atherosclerotic heart disease of native coronary artery without angina pectoris: Secondary | ICD-10-CM | POA: Insufficient documentation

## 2019-06-03 DIAGNOSIS — Z7983 Long term (current) use of bisphosphonates: Secondary | ICD-10-CM | POA: Diagnosis not present

## 2019-06-03 DIAGNOSIS — Z8551 Personal history of malignant neoplasm of bladder: Secondary | ICD-10-CM | POA: Insufficient documentation

## 2019-06-03 DIAGNOSIS — F329 Major depressive disorder, single episode, unspecified: Secondary | ICD-10-CM | POA: Insufficient documentation

## 2019-06-03 DIAGNOSIS — Z79899 Other long term (current) drug therapy: Secondary | ICD-10-CM | POA: Insufficient documentation

## 2019-06-03 DIAGNOSIS — Z7901 Long term (current) use of anticoagulants: Secondary | ICD-10-CM | POA: Diagnosis not present

## 2019-06-03 DIAGNOSIS — Z923 Personal history of irradiation: Secondary | ICD-10-CM | POA: Insufficient documentation

## 2019-06-03 DIAGNOSIS — Z7982 Long term (current) use of aspirin: Secondary | ICD-10-CM | POA: Diagnosis not present

## 2019-06-03 DIAGNOSIS — E785 Hyperlipidemia, unspecified: Secondary | ICD-10-CM | POA: Insufficient documentation

## 2019-06-03 DIAGNOSIS — M8580 Other specified disorders of bone density and structure, unspecified site: Secondary | ICD-10-CM | POA: Diagnosis not present

## 2019-06-03 DIAGNOSIS — Z8249 Family history of ischemic heart disease and other diseases of the circulatory system: Secondary | ICD-10-CM | POA: Insufficient documentation

## 2019-06-03 DIAGNOSIS — M85859 Other specified disorders of bone density and structure, unspecified thigh: Secondary | ICD-10-CM

## 2019-06-03 LAB — COMPREHENSIVE METABOLIC PANEL
ALT: 51 U/L — ABNORMAL HIGH (ref 0–44)
AST: 58 U/L — ABNORMAL HIGH (ref 15–41)
Albumin: 3.6 g/dL (ref 3.5–5.0)
Alkaline Phosphatase: 72 U/L (ref 38–126)
Anion gap: 8 (ref 5–15)
BUN: 17 mg/dL (ref 8–23)
CO2: 28 mmol/L (ref 22–32)
Calcium: 9 mg/dL (ref 8.9–10.3)
Chloride: 98 mmol/L (ref 98–111)
Creatinine, Ser: 1.14 mg/dL (ref 0.61–1.24)
GFR calc Af Amer: >60 mL/min (ref >60)
GFR calc non Af Amer: 58 mL/min — ABNORMAL LOW (ref >60)
Glucose, Bld: 147 mg/dL — ABNORMAL HIGH (ref 70–99)
Potassium: 4.3 mmol/L (ref 3.5–5.1)
Sodium: 134 mmol/L — ABNORMAL LOW (ref 135–145)
Total Bilirubin: 0.5 mg/dL (ref 0.3–1.2)
Total Protein: 6.3 g/dL — ABNORMAL LOW (ref 6.5–8.1)

## 2019-06-03 MED ORDER — DENOSUMAB 60 MG/ML ~~LOC~~ SOSY
60.0000 mg | PREFILLED_SYRINGE | Freq: Once | SUBCUTANEOUS | Status: AC
  Administered 2019-06-03: 60 mg via SUBCUTANEOUS
  Filled 2019-06-03: qty 1

## 2019-06-03 NOTE — Progress Notes (Signed)
Patient here for follow up. Caregiver reports that patient's appetite is up and down. Pt gets short of breath and tired easy with little activity.
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
## 2019-06-06 ENCOUNTER — Other Ambulatory Visit: Payer: Self-pay

## 2019-06-06 ENCOUNTER — Ambulatory Visit (INDEPENDENT_AMBULATORY_CARE_PROVIDER_SITE_OTHER): Payer: Medicare Other | Admitting: Podiatry

## 2019-06-06 ENCOUNTER — Encounter: Payer: Self-pay | Admitting: Podiatry

## 2019-06-06 DIAGNOSIS — B351 Tinea unguium: Secondary | ICD-10-CM | POA: Diagnosis not present

## 2019-06-06 DIAGNOSIS — M79675 Pain in left toe(s): Secondary | ICD-10-CM | POA: Diagnosis not present

## 2019-06-06 DIAGNOSIS — D689 Coagulation defect, unspecified: Secondary | ICD-10-CM

## 2019-06-06 DIAGNOSIS — M79674 Pain in right toe(s): Secondary | ICD-10-CM

## 2019-06-06 NOTE — Progress Notes (Signed)
Complaint:  Visit Type: Patient presents  to my office for  preventative foot care services. Complaint: Patient states" my nails have grown long and thick and become painful to walk and wear shoes" Patient has been taking eliquiss. The patient presents for preventative foot care services.  Podiatric Exam: Vascular: dorsalis pedis and posterior tibial pulses are palpable bilateral. Capillary return is immediate. Temperature gradient is WNL. Skin turgor WNL  Sensorium: Normal Semmes Weinstein monofilament test. Normal tactile sensation bilaterally. Nail Exam: Pt has thick disfigured discolored nails with subungual debris noted bilateral hallux nails. Ulcer Exam: There is no evidence of ulcer or pre-ulcerative changes or infection. Orthopedic Exam: Muscle tone and strength are WNL. No limitations in general ROM. No crepitus or effusions noted. Foot type and digits show no abnormalities.  HAV  B/L. Skin: No Porokeratosis. No infection or ulcers  Diagnosis:  Onychomycosis, , Pain in right toe, pain in left toes  Treatment & Plan Procedures and Treatment: Consent by patient was obtained for treatment procedures.   Debridement of mycotic and hypertrophic toenails, 1 through 5 bilateral and clearing of subungual debris. No ulceration, no infection noted.  Return Visit-Office Procedure: Patient instructed to return to the office for a follow up visit 3 months for continued evaluation and treatment.    Gardiner Barefoot DPM

## 2019-06-07 ENCOUNTER — Encounter: Payer: Self-pay | Admitting: Physician Assistant

## 2019-06-07 ENCOUNTER — Ambulatory Visit
Admission: RE | Admit: 2019-06-07 | Discharge: 2019-06-07 | Disposition: A | Discharge status: Home / Self Care | Payer: Medicare Other | Source: Ambulatory Visit | Attending: Physician Assistant | Admitting: Physician Assistant

## 2019-06-07 ENCOUNTER — Other Ambulatory Visit: Payer: Self-pay

## 2019-06-07 ENCOUNTER — Ambulatory Visit (INDEPENDENT_AMBULATORY_CARE_PROVIDER_SITE_OTHER): Payer: Medicare Other | Admitting: Physician Assistant

## 2019-06-07 VITALS — BP 151/74 | HR 73 | Temp 97.0°F | Resp 16 | Wt 160.0 lb

## 2019-06-07 DIAGNOSIS — M542 Cervicalgia: Secondary | ICD-10-CM

## 2019-06-07 MED ORDER — TRAMADOL HCL 50 MG PO TABS: 50.0000 mg | ORAL_TABLET | Freq: Three times a day (TID) | ORAL | 0 refills | Status: AC | PRN

## 2019-06-07 NOTE — Progress Notes (Signed)
Patient: Charles Cook Male    DOB: 10-Oct-1929   86 y.o.   MRN: [id number] Visit Date: 06/07/2019  Today's Provider: Mar Daring, PA-C   Chief Complaint  Patient presents with  . Shoulder Pain   Subjective:     Patient here with care giver (home health aid).  Shoulder Pain  The pain is present in the back, left shoulder and right shoulder (upper back). This is a new problem. The current episode started 1 to 4 weeks ago (several weeks ago). The problem occurs daily. The problem has been unchanged. The quality of the pain is described as dull. The pain is at a severity of 9/10 (pain rating was given by healthcare attendant with him, not by patient). The pain is moderate. Pertinent negatives include no fever, limited range of motion, numbness, stiffness or tingling. The symptoms are aggravated by standing and activity. He has tried rest for the symptoms. The treatment provided moderate relief. Family history does not include gout or rheumatoid arthritis. His past medical history is significant for osteoarthritis. There is no history of diabetes, gout or rheumatoid arthritis.  Patient reports when he is standing or upright that he "fatigues" through his upper back, neck and shoulders. He denies any limited movements and weakness.   Allergies  Allergen Reactions  . Moxifloxacin Hcl In Nacl Other (See Comments)    Confusion      Current Outpatient Medications:  .  acetaminophen (TYLENOL) 500 MG tablet, Take 1,000 mg by mouth daily as needed for moderate pain or headache., Disp: , Rfl:  .  amiodarone (PACERONE) 200 MG tablet, TAKE ONE TABLET BY MOUTH DAILY, Disp: 90 tablet, Rfl: 1 .  apixaban (ELIQUIS) 2.5 MG TABS tablet, Take 1 tablet (2.5 mg total) by mouth 2 (two) times daily., Disp: 60 tablet, Rfl: 1 .  aspirin EC 81 MG tablet, Take 81 mg by mouth at bedtime., Disp: , Rfl:  .  Calcium Carbonate-Vitamin D (CALCIUM-D) 600-400 MG-UNIT TABS, Take 1 tablet by mouth 2 (two) times  daily. , Disp: , Rfl:  .  Cyanocobalamin (VITAMIN B-12) 5000 MCG SUBL, Place 5,000 mcg under the tongue daily., Disp: , Rfl:  .  denosumab (PROLIA) 60 MG/ML SOSY injection, Inject 60 mg into the skin every 6 (six) months., Disp: , Rfl:  .  digoxin (LANOXIN) 0.125 MG tablet, Take 1 tablet (125 mcg total) by mouth daily., Disp: 90 tablet, Rfl: 3 .  diphenhydrAMINE (BENADRYL) 25 MG tablet, Take 25 mg by mouth at bedtime as needed for allergies. , Disp: , Rfl:  .  fluticasone (FLONASE) 50 MCG/ACT nasal spray, USE 2 SPRAYS IN EACH NOSTRIL DAILY, Disp: 48 g, Rfl: 1 .  Leuprolide Acetate, 6 Month, (LUPRON DEPOT, 45-MONTH, IM), Inject into the muscle every 6 (six) months., Disp: , Rfl:  .  Multiple Vitamin (MULTIVITAMIN WITH MINERALS) TABS tablet, Take 1 tablet by mouth daily., Disp: , Rfl:  .  niacin (NIASPAN) 1000 MG CR tablet, Take 1,000 mg by mouth daily., Disp: , Rfl:  .  polyethylene glycol (MIRALAX / GLYCOLAX) packet, Take 17 g by mouth daily as needed for mild constipation., Disp: , Rfl:  .  sertraline (ZOLOFT) 50 MG tablet, Take 50 mg by mouth daily., Disp: , Rfl:  .  traZODone (DESYREL) 100 MG tablet, TAKE ONE TABLET BY MOUTH AT BEDTIME, Disp: 90 tablet, Rfl: 1  Review of Systems  Constitutional: Positive for fatigue. Negative for activity change, appetite change, fever and  unexpected weight change.  Respiratory: Negative.   Cardiovascular: Negative.   Musculoskeletal: Positive for arthralgias and neck pain. Negative for gout and stiffness.  Neurological: Negative for dizziness, tingling, weakness, light-headedness, numbness and headaches.    Social History   Tobacco Use  . Smoking status: Never Smoker  . Smokeless tobacco: Never Used  Substance Use Topics  . Alcohol use: Yes    Alcohol/week: 0.0 standard drinks    Comment: one glass in 6 months       Objective:   BP (!) 151/74 (BP Location: Left Arm, Patient Position: Sitting, Cuff Size: Large)   Pulse 73   Temp (!) 97 F (36.1  C) (Temporal)   Resp 16   Wt 160 lb (72.6 kg)   BMI 21.70 kg/m  Vitals:   06/07/19 1322  BP: (!) 151/74  Pulse: 73  Resp: 16  Temp: (!) 97 F (36.1 C)  TempSrc: Temporal  Weight: 160 lb (72.6 kg)  Body mass index is 21.7 kg/m.   Physical Exam Vitals reviewed.  Constitutional:      General: He is not in acute distress.    Appearance: Normal appearance. He is well-developed. He is not ill-appearing or diaphoretic.  HENT:     Head: Normocephalic and atraumatic.  Neck:     Thyroid: No thyromegaly.     Vascular: No JVD.     Trachea: No tracheal deviation.  Cardiovascular:     Rate and Rhythm: Normal rate. Rhythm irregularly irregular.     Pulses: Normal pulses.     Heart sounds: Murmur present. Systolic murmur present with a grade of 2/6. No friction rub. No gallop.   Pulmonary:     Effort: Pulmonary effort is normal. No respiratory distress.     Breath sounds: Normal breath sounds. No wheezing or rales.  Musculoskeletal:     Right shoulder: Normal. No tenderness or bony tenderness. Normal range of motion. Normal strength. Normal pulse.     Left shoulder: Normal. No tenderness or bony tenderness. Normal range of motion. Normal strength. Normal pulse.     Cervical back: Normal range of motion and neck supple. Tenderness (through upper trapezius) present. No deformity, spasms, torticollis, bony tenderness or crepitus. No pain with movement. Normal range of motion.     Thoracic back: Normal. No spasms, tenderness or bony tenderness. Normal range of motion.  Lymphadenopathy:     Cervical: No cervical adenopathy.  Neurological:     Mental Status: He is alert.      No results found for any visits on 06/07/19.     Assessment & Plan    1. Neck pain Will obtain imaging as below to r/o bony abnormality. DDx: OA, nerve root compression, muscle strain, compression fracture (had fall in Nov 2020 per daughter Benjamine Mola on phone), or bony met (has metastatic prostate cancer). I  will f/u pending results. Will give tramadol as below for pain relief. Call if worsening.  - DG Cervical Spine Complete; Future - DG Thoracic Spine W/Swimmers; Future - traMADol (ULTRAM) 50 MG tablet; Take 1 tablet (50 mg total) by mouth every 8 (eight) hours as needed for up to 5 days.  Dispense: 15 tablet; Refill: 0     Mar Daring, PA-C  [facility]

## 2019-06-10 ENCOUNTER — Encounter: Payer: Self-pay | Admitting: Physician Assistant

## 2019-06-10 ENCOUNTER — Telehealth: Payer: Self-pay

## 2019-06-10 NOTE — Progress Notes (Signed)
Patient's daughter was advised via another message. 

## 2019-06-10 NOTE — Telephone Encounter (Signed)
Noted. Charles Cook it is helping.

## 2019-06-10 NOTE — Telephone Encounter (Signed)
Patient's daughter [name]) was advised and states that he is doing better with the help of the Tramadol. [name] states that she will also reach out to the caregiver and make sure he father is still doing good on his medication.FYI

## 2019-06-10 NOTE — Telephone Encounter (Signed)
-----   Message from Mar Daring, PA-C sent at 06/10/2019  7:22 AM EST ----- Cervical spine xrays show only degenerative (arthritic) changes. No fractures or other bony abnormality noted. There is also noted to have foraminal narrowing. This can cause nerve root compression and could cause numbness, tingling or shooting pains down his arms. How has he responded to the tramadol?

## 2019-07-05 ENCOUNTER — Encounter: Payer: Medicare Other | Admitting: Physician Assistant

## 2019-07-08 ENCOUNTER — Other Ambulatory Visit: Payer: Medicare Other

## 2019-07-08 ENCOUNTER — Ambulatory Visit: Payer: Medicare Other | Admitting: Oncology

## 2019-07-08 ENCOUNTER — Ambulatory Visit: Payer: Medicare Other

## 2019-07-31 ENCOUNTER — Other Ambulatory Visit: Payer: Self-pay | Admitting: Physician Assistant

## 2019-08-20 ENCOUNTER — Telehealth: Payer: Self-pay | Admitting: Oncology

## 2019-08-20 NOTE — Telephone Encounter (Signed)
MD will not be in the office on 12-02-19. Writer phoned patient's daughter and rescheduled patient's appts for 12-20-19.

## 2019-08-22 ENCOUNTER — Other Ambulatory Visit: Payer: Self-pay

## 2019-08-22 ENCOUNTER — Encounter: Payer: Self-pay | Admitting: Physician Assistant

## 2019-08-22 ENCOUNTER — Ambulatory Visit (INDEPENDENT_AMBULATORY_CARE_PROVIDER_SITE_OTHER): Payer: Medicare Other | Admitting: Physician Assistant

## 2019-08-22 VITALS — BP 109/62 | HR 69 | Ht 71.0 in | Wt 160.0 lb

## 2019-08-22 DIAGNOSIS — C61 Malignant neoplasm of prostate: Secondary | ICD-10-CM | POA: Diagnosis not present

## 2019-08-22 MED ORDER — LEUPROLIDE ACETATE (6 MONTH) 45 MG ~~LOC~~ KIT
45.0000 mg | PACK | Freq: Once | SUBCUTANEOUS | Status: AC
  Administered 2019-08-22: 45 mg via SUBCUTANEOUS
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
## 2019-08-22 NOTE — Progress Notes (Signed)
Eligard SubQ Injection   Due to Prostate Cancer patient is present today for a Eligard Injection.  Medication: Eligard 6 month Dose: 45 mg  Location: right lower  Lot: [id number] Exp: 11/22  Patient tolerated well, no complications were noted  Performed by: Verlene Mayer, [place]  Per Dr. Erlene Quan patient is to continue therapy for 6 months . Patient's next follow up was scheduled for 02/23/20. This appointment was scheduled using wheel and given to patient today along with reminder continue on Vitamin D 800-1000iu and Calium 1000-1200mg  daily while on Androgen Deprivation Therapy.   PA approval dates: NO PA required

## 2019-08-23 LAB — PSA: Prostate Specific Ag, Serum: <0.1 ng/mL (ref 0.0–4.0)

## 2019-08-29 DIAGNOSIS — I10 Essential (primary) hypertension: Secondary | ICD-10-CM | POA: Diagnosis not present

## 2019-08-29 DIAGNOSIS — R079 Chest pain, unspecified: Secondary | ICD-10-CM | POA: Diagnosis not present

## 2019-08-29 DIAGNOSIS — I420 Dilated cardiomyopathy: Secondary | ICD-10-CM | POA: Diagnosis not present

## 2019-08-29 DIAGNOSIS — I25810 Atherosclerosis of coronary artery bypass graft(s) without angina pectoris: Secondary | ICD-10-CM | POA: Diagnosis not present

## 2019-08-29 DIAGNOSIS — I4819 Other persistent atrial fibrillation: Secondary | ICD-10-CM | POA: Diagnosis not present

## 2019-09-02 ENCOUNTER — Telehealth: Payer: Self-pay

## 2019-09-02 NOTE — Telephone Encounter (Signed)
Patient does not require PA for Eligard/Lupron as he has Medicare Part B, and supplemental medicare plan.

## 2019-09-05 ENCOUNTER — Encounter: Payer: Self-pay | Admitting: Podiatry

## 2019-09-05 ENCOUNTER — Ambulatory Visit (INDEPENDENT_AMBULATORY_CARE_PROVIDER_SITE_OTHER): Payer: Medicare Other | Admitting: Podiatry

## 2019-09-05 ENCOUNTER — Other Ambulatory Visit: Payer: Self-pay

## 2019-09-05 VITALS — Temp 97.2°F

## 2019-09-05 DIAGNOSIS — M79674 Pain in right toe(s): Secondary | ICD-10-CM | POA: Diagnosis not present

## 2019-09-05 DIAGNOSIS — D689 Coagulation defect, unspecified: Secondary | ICD-10-CM

## 2019-09-05 DIAGNOSIS — B351 Tinea unguium: Secondary | ICD-10-CM

## 2019-09-05 DIAGNOSIS — M79675 Pain in left toe(s): Secondary | ICD-10-CM | POA: Diagnosis not present

## 2019-09-05 NOTE — Progress Notes (Signed)
This patient returns to my office for at risk foot care.  This patient requires this care by a professional since this patient will be at risk due to having coagulation defect due to Eliquis.  This patient is unable to cut nails himself since the patient cannot reach his nails.These nails are painful walking and wearing shoes.  This patient presents for at risk foot care today.  General Appearance  Alert, conversant and in no acute stress.  Vascular  Dorsalis pedis and posterior tibial  pulses are palpable  bilaterally.  Capillary return is within normal limits  bilaterally. Temperature is within normal limits  bilaterally.  Neurologic  Senn-Weinstein monofilament wire test within normal limits  bilaterally. Muscle power within normal limits bilaterally.  Nails Thick disfigured discolored nails with subungual debris  from hallux to fifth toes bilaterally. No evidence of bacterial infection or drainage bilaterally.  Orthopedic  No limitations of motion  feet .  No crepitus or effusions noted.  No bony pathology or digital deformities noted.  HAV  B/L.  Skin  normotropic skin with no porokeratosis noted bilaterally.  No signs of infections or ulcers noted.     Onychomycosis  Pain in right toes  Pain in left toes  Consent was obtained for treatment procedures.   Mechanical debridement of nails 1-5  bilaterally performed with a nail nipper.  Filed with dremel without incident.    Return office visit   3 months                 Told patient to return for periodic foot care and evaluation due to potential at risk complications.   Nyashia Raney DPM  

## 2019-10-24 ENCOUNTER — Other Ambulatory Visit: Payer: Self-pay | Admitting: Family Medicine

## 2019-10-24 DIAGNOSIS — F5101 Primary insomnia: Secondary | ICD-10-CM

## 2019-12-02 ENCOUNTER — Ambulatory Visit: Payer: Medicare Other | Admitting: Oncology

## 2019-12-02 ENCOUNTER — Ambulatory Visit: Payer: Medicare Other

## 2019-12-02 ENCOUNTER — Other Ambulatory Visit: Payer: Medicare Other

## 2019-12-09 ENCOUNTER — Ambulatory Visit: Payer: Medicare Other | Admitting: Podiatry

## 2019-12-16 ENCOUNTER — Ambulatory Visit: Payer: Medicare Other | Admitting: Podiatry

## 2019-12-20 ENCOUNTER — Inpatient Hospital Stay (HOSPITAL_BASED_OUTPATIENT_CLINIC_OR_DEPARTMENT_OTHER): Payer: Medicare Other | Admitting: Oncology

## 2019-12-20 ENCOUNTER — Other Ambulatory Visit: Payer: Self-pay

## 2019-12-20 ENCOUNTER — Inpatient Hospital Stay: Payer: Medicare Other | Attending: Oncology

## 2019-12-20 ENCOUNTER — Encounter: Payer: Self-pay | Admitting: Oncology

## 2019-12-20 ENCOUNTER — Inpatient Hospital Stay: Payer: Medicare Other

## 2019-12-20 VITALS — BP 150/62 | HR 63 | Temp 95.6°F | Resp 16 | Wt 156.5 lb

## 2019-12-20 DIAGNOSIS — Z79899 Other long term (current) drug therapy: Secondary | ICD-10-CM | POA: Diagnosis not present

## 2019-12-20 DIAGNOSIS — Z7983 Long term (current) use of bisphosphonates: Secondary | ICD-10-CM

## 2019-12-20 DIAGNOSIS — C772 Secondary and unspecified malignant neoplasm of intra-abdominal lymph nodes: Secondary | ICD-10-CM | POA: Insufficient documentation

## 2019-12-20 DIAGNOSIS — M85859 Other specified disorders of bone density and structure, unspecified thigh: Secondary | ICD-10-CM

## 2019-12-20 DIAGNOSIS — C61 Malignant neoplasm of prostate: Secondary | ICD-10-CM | POA: Diagnosis not present

## 2019-12-20 DIAGNOSIS — Z923 Personal history of irradiation: Secondary | ICD-10-CM | POA: Insufficient documentation

## 2019-12-20 LAB — COMPREHENSIVE METABOLIC PANEL
ALT: 46 U/L — ABNORMAL HIGH (ref 0–44)
AST: 49 U/L — ABNORMAL HIGH (ref 15–41)
Albumin: 3.6 g/dL (ref 3.5–5.0)
Alkaline Phosphatase: 74 U/L (ref 38–126)
Anion gap: 7 (ref 5–15)
BUN: 16 mg/dL (ref 8–23)
CO2: 29 mmol/L (ref 22–32)
Calcium: 8.5 mg/dL — ABNORMAL LOW (ref 8.9–10.3)
Chloride: 97 mmol/L — ABNORMAL LOW (ref 98–111)
Creatinine, Ser: 1.08 mg/dL (ref 0.61–1.24)
GFR calc Af Amer: >60 mL/min (ref >60)
GFR calc non Af Amer: >60 mL/min (ref >60)
Glucose, Bld: 134 mg/dL — ABNORMAL HIGH (ref 70–99)
Potassium: 4.7 mmol/L (ref 3.5–5.1)
Sodium: 133 mmol/L — ABNORMAL LOW (ref 135–145)
Total Bilirubin: 0.5 mg/dL (ref 0.3–1.2)
Total Protein: 6.3 g/dL — ABNORMAL LOW (ref 6.5–8.1)

## 2019-12-20 MED ORDER — DENOSUMAB 60 MG/ML ~~LOC~~ SOSY
60.0000 mg | PREFILLED_SYRINGE | Freq: Once | SUBCUTANEOUS | Status: AC
  Administered 2019-12-20: 60 mg via SUBCUTANEOUS
  Filled 2019-12-20: qty 1

## 2019-12-20 NOTE — Progress Notes (Signed)
Ca 8.5, ok to give prolia per md
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
## 2019-12-23 ENCOUNTER — Ambulatory Visit (INDEPENDENT_AMBULATORY_CARE_PROVIDER_SITE_OTHER): Payer: Medicare Other | Admitting: Podiatry

## 2019-12-23 ENCOUNTER — Encounter: Payer: Self-pay | Admitting: Podiatry

## 2019-12-23 ENCOUNTER — Other Ambulatory Visit: Payer: Self-pay

## 2019-12-23 DIAGNOSIS — M79675 Pain in left toe(s): Secondary | ICD-10-CM | POA: Diagnosis not present

## 2019-12-23 DIAGNOSIS — D689 Coagulation defect, unspecified: Secondary | ICD-10-CM | POA: Diagnosis not present

## 2019-12-23 DIAGNOSIS — B351 Tinea unguium: Secondary | ICD-10-CM | POA: Diagnosis not present

## 2019-12-23 DIAGNOSIS — M79674 Pain in right toe(s): Secondary | ICD-10-CM

## 2019-12-23 NOTE — Progress Notes (Signed)
This patient returns to my office for at risk foot care.  This patient requires this care by a professional since this patient will be at risk due to having coagulation defect due to Eliquis.  This patient is unable to cut nails himself since the patient cannot reach his nails.These nails are painful walking and wearing shoes.  This patient presents for at risk foot care today.  General Appearance  Alert, conversant and in no acute stress.  Vascular  Dorsalis pedis and posterior tibial  pulses are palpable  bilaterally.  Capillary return is within normal limits  bilaterally. Temperature is within normal limits  bilaterally.  Neurologic  Senn-Weinstein monofilament wire test within normal limits  bilaterally. Muscle power within normal limits bilaterally.  Nails Thick disfigured discolored nails with subungual debris  from hallux to fifth toes bilaterally. No evidence of bacterial infection or drainage bilaterally.  Orthopedic  No limitations of motion  feet .  No crepitus or effusions noted.  No bony pathology or digital deformities noted.  HAV  B/L.  Skin  normotropic skin with no porokeratosis noted bilaterally.  No signs of infections or ulcers noted.     Onychomycosis  Pain in right toes  Pain in left toes  Consent was obtained for treatment procedures.   Mechanical debridement of nails 1-5  bilaterally performed with a nail nipper.  Filed with dremel without incident.    Return office visit   3 months                 Told patient to return for periodic foot care and evaluation due to potential at risk complications.   Gardiner Barefoot DPM

## 2020-01-23 ENCOUNTER — Other Ambulatory Visit: Payer: Self-pay | Admitting: Physician Assistant

## 2020-01-23 NOTE — Telephone Encounter (Signed)
Requested medication (s) are due for refill today: yes  Requested medication (s) are on the active medication list: yes  Last refill: 12/31/19  Future visit scheduled: no  Notes to clinic:  not delegated; no valid encounter within last 6 months    Requested Prescriptions  Pending Prescriptions Disp Refills   amiodarone (PACERONE) 200 MG tablet [Pharmacy Med Name: amiodarone 200 mg tablet] 90 tablet 1    Sig: TAKE ONE TABLET BY MOUTH DAILY      Not Delegated - Cardiovascular: Antiarrhythmic Agents - amiodarone Failed - 01/23/2020  1:38 PM      Failed - This refill cannot be delegated      Failed - TSH in normal range and within 180 days    TSH  Date Value Ref Range Status  02/16/2016 0.334 (L) 0.450 - 4.500 uIU/mL Final          Failed - Mg Level in normal range and within 180 days    Magnesium  Date Value Ref Range Status  01/27/2018 1.9 1.7 - 2.4 mg/dL Final    Comment:    Performed at [facility], [address] [place], [place] [address]          Failed - Ca in normal range and within 180 days    Calcium  Date Value Ref Range Status  12/20/2019 8.5 (L) 8.9 - 10.3 mg/dL Final          Failed - AST in normal range and within 180 days    AST  Date Value Ref Range Status  12/20/2019 49 (H) 15 - 41 U/L Final          Failed - ALT in normal range and within 180 days    ALT  Date Value Ref Range Status  12/20/2019 46 (H) 0 - 44 U/L Final          Failed - Patient had ECG in the last 180 days.      Failed - Last BP in normal range    BP Readings from Last 1 Encounters:  12/20/19 (!) 150/62          Failed - Valid encounter within last 6 months    Recent Outpatient Visits           7 months ago Neck pain   [place], [place], [place]   1 year ago Left-sided chest pain   [facility], [place]   1 year ago Postprocedural hypotension   [place], [facility],  [place]   1 year ago S/P cholecystectomy   [place], [facility], [place]   2 years ago Transition of care performed with sharing of clinical summary   [place], Cle[facility], [place]       Future Appointments             In 7 months Hollice Espy, MD [place] in normal range and within 180 days    Potassium  Date Value Ref Range Status  12/20/2019 4.7 3.5 - 5.1 mmol/L Final          Passed - Last Heart Rate in normal range    Pulse Readings from Last 1 Encounters:  12/20/19 63

## 2020-01-24 NOTE — Telephone Encounter (Signed)
[facility] faxed refill request for amiodarone (PACERONE) 200 MG tablet. Please advise. Thanks TNP
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
## 2020-02-04 ENCOUNTER — Ambulatory Visit (INDEPENDENT_AMBULATORY_CARE_PROVIDER_SITE_OTHER): Payer: Medicare Other

## 2020-02-04 ENCOUNTER — Other Ambulatory Visit: Payer: Self-pay

## 2020-02-04 DIAGNOSIS — Z0000 Encounter for general adult medical examination without abnormal findings: Secondary | ICD-10-CM

## 2020-02-04 NOTE — Patient Instructions (Signed)
Charles Cook , Thank you for taking time to come for your Medicare Wellness Visit. I appreciate your ongoing commitment to your health goals. Please review the following plan we discussed and let me know if I can assist you in the future.   Screening recommendations/referrals: Colonoscopy: No longer required.  Recommended yearly ophthalmology/optometry visit for glaucoma screening and checkup Recommended yearly dental visit for hygiene and checkup  Vaccinations: Influenza vaccine: Currently due, will receive at next in office apt.  Pneumococcal vaccine: Prevnar 13 due. Will discuss with PCP. Tdap vaccine: Up to date, due 10/2020 Shingles vaccine: Shingrix discussed. Please contact your pharmacy for coverage information.     Advanced directives: Please bring a copy of your POA (Power of Attorney) and/or Living Will to your next appointment.   Conditions/risks identified: Recommend to increase water intake to 6-8 8 oz glasses a day.   Next appointment: 02/19/20 @ 1:00 PM with Charles Cook. Declined scheduling an AWV for 2022 at this time.  Preventive Care 84 Years and Older, Male Preventive care refers to lifestyle choices and visits with your health care provider that can promote health and wellness. What does preventive care include?  A yearly physical exam. This is also called an annual well check.  Dental exams once or twice a year.  Routine eye exams. Ask your health care provider how often you should have your eyes checked.  Personal lifestyle choices, including:  Daily care of your teeth and gums.  Regular physical activity.  Eating a healthy diet.  Avoiding tobacco and drug use.  Limiting alcohol use.  Practicing safe sex.  Taking low doses of aspirin every day.  Taking vitamin and mineral supplements as recommended by your health care provider. What happens during an annual well check? The services and screenings done by your health care provider during your  annual well check will depend on your age, overall health, lifestyle risk factors, and family history of disease. Counseling  Your health care provider may ask you questions about your:  Alcohol use.  Tobacco use.  Drug use.  Emotional well-being.  Home and relationship well-being.  Sexual activity.  Eating habits.  History of falls.  Memory and ability to understand (cognition).  Work and work Statistician. Screening  You may have the following tests or measurements:  Height, weight, and BMI.  Blood pressure.  Lipid and cholesterol levels. These may be checked every 5 years, or more frequently if you are over 70 years old.  Skin check.  Lung cancer screening. You may have this screening every year starting at age 60 if you have a 30-pack-year history of smoking and currently smoke or have quit within the past 15 years.  Fecal occult blood test (FOBT) of the stool. You may have this test every year starting at age 66.  Flexible sigmoidoscopy or colonoscopy. You may have a sigmoidoscopy every 5 years or a colonoscopy every 10 years starting at age 69.  Prostate cancer screening. Recommendations will vary depending on your family history and other risks.  Hepatitis C blood test.  Hepatitis B blood test.  Sexually transmitted disease (STD) testing.  Diabetes screening. This is done by checking your blood sugar (glucose) after you have not eaten for a while (fasting). You may have this done every 1-3 years.  Abdominal aortic aneurysm (AAA) screening. You may need this if you are a current or former smoker.  Osteoporosis. You may be screened starting at age 25 if you are at high risk. Talk  with your health care provider about your test results, treatment options, and if necessary, the need for more tests. Vaccines  Your health care provider may recommend certain vaccines, such as:  Influenza vaccine. This is recommended every year.  Tetanus, diphtheria, and  acellular pertussis (Tdap, Td) vaccine. You may need a Td booster every 10 years.  Zoster vaccine. You may need this after age 32.  Pneumococcal 13-valent conjugate (PCV13) vaccine. One dose is recommended after age 14.  Pneumococcal polysaccharide (PPSV23) vaccine. One dose is recommended after age 43. Talk to your health care provider about which screenings and vaccines you need and how often you need them. This information is not intended to replace advice given to you by your health care provider. Make sure you discuss any questions you have with your health care provider. Document Released: 05/15/2015 Document Revised: 01/06/2016 Document Reviewed: 02/17/2015 Elsevier Interactive Patient Education  2017 [place] Prevention in the Home Falls can cause injuries. They can happen to people of all ages. There are many things you can do to make your home safe and to help prevent falls. What can I do on the outside of my home?  Regularly fix the edges of walkways and driveways and fix any cracks.  Remove anything that might make you trip as you walk through a door, such as a raised step or threshold.  Trim any bushes or trees on the path to your home.  Use bright outdoor lighting.  Clear any walking paths of anything that might make someone trip, such as rocks or tools.  Regularly check to see if handrails are loose or broken. Make sure that both sides of any steps have handrails.  Any raised decks and porches should have guardrails on the edges.  Have any leaves, snow, or ice cleared regularly.  Use sand or salt on walking paths during winter.  Clean up any spills in your garage right away. This includes oil or grease spills. What can I do in the bathroom?  Use night lights.  Install grab bars by the toilet and in the tub and shower. Do not use towel bars as grab bars.  Use non-skid mats or decals in the tub or shower.  If you need to sit down in the shower, use  a plastic, non-slip stool.  Keep the floor dry. Clean up any water that spills on the floor as soon as it happens.  Remove soap buildup in the tub or shower regularly.  Attach bath mats securely with double-sided non-slip rug tape.  Do not have throw rugs and other things on the floor that can make you trip. What can I do in the bedroom?  Use night lights.  Make sure that you have a light by your bed that is easy to reach.  Do not use any sheets or blankets that are too big for your bed. They should not hang down onto the floor.  Have a firm chair that has side arms. You can use this for support while you get dressed.  Do not have throw rugs and other things on the floor that can make you trip. What can I do in the kitchen?  Clean up any spills right away.  Avoid walking on wet floors.  Keep items that you use a lot in easy-to-reach places.  If you need to reach something above you, use a strong step stool that has a grab bar.  Keep electrical cords out of the way.  Do  not use floor polish or wax that makes floors slippery. If you must use wax, use non-skid floor wax.  Do not have throw rugs and other things on the floor that can make you trip. What can I do with my stairs?  Do not leave any items on the stairs.  Make sure that there are handrails on both sides of the stairs and use them. Fix handrails that are broken or loose. Make sure that handrails are as long as the stairways.  Check any carpeting to make sure that it is firmly attached to the stairs. Fix any carpet that is loose or worn.  Avoid having throw rugs at the top or bottom of the stairs. If you do have throw rugs, attach them to the floor with carpet tape.  Make sure that you have a light switch at the top of the stairs and the bottom of the stairs. If you do not have them, ask someone to add them for you. What else can I do to help prevent falls?  Wear shoes that:  Do not have high heels.  Have  rubber bottoms.  Are comfortable and fit you well.  Are closed at the toe. Do not wear sandals.  If you use a stepladder:  Make sure that it is fully opened. Do not climb a closed stepladder.  Make sure that both sides of the stepladder are locked into place.  Ask someone to hold it for you, if possible.  Clearly mark and make sure that you can see:  Any grab bars or handrails.  First and last steps.  Where the edge of each step is.  Use tools that help you move around (mobility aids) if they are needed. These include:  Canes.  Walkers.  Scooters.  Crutches.  Turn on the lights when you go into a dark area. Replace any light bulbs as soon as they burn out.  Set up your furniture so you have a clear path. Avoid moving your furniture around.  If any of your floors are uneven, fix them.  If there are any pets around you, be aware of where they are.  Review your medicines with your doctor. Some medicines can make you feel dizzy. This can increase your chance of falling. Ask your doctor what other things that you can do to help prevent falls. This information is not intended to replace advice given to you by your health care provider. Make sure you discuss any questions you have with your health care provider. Document Released: 02/12/2009 Document Revised: 09/24/2015 Document Reviewed: 05/23/2014 Elsevier Interactive Patient Education  2017 [employer].

## 2020-02-14 ENCOUNTER — Other Ambulatory Visit: Payer: Self-pay

## 2020-02-14 ENCOUNTER — Ambulatory Visit (INDEPENDENT_AMBULATORY_CARE_PROVIDER_SITE_OTHER): Payer: Medicare Other

## 2020-02-14 DIAGNOSIS — Z23 Encounter for immunization: Secondary | ICD-10-CM | POA: Diagnosis not present

## 2020-02-19 ENCOUNTER — Encounter: Payer: Self-pay | Admitting: Physician Assistant

## 2020-02-19 ENCOUNTER — Ambulatory Visit (INDEPENDENT_AMBULATORY_CARE_PROVIDER_SITE_OTHER): Payer: Medicare Other | Admitting: Physician Assistant

## 2020-02-19 ENCOUNTER — Other Ambulatory Visit: Payer: Self-pay

## 2020-02-19 VITALS — BP 166/77 | HR 65 | Temp 98.5°F | Wt 156.0 lb

## 2020-02-19 DIAGNOSIS — I10 Essential (primary) hypertension: Secondary | ICD-10-CM | POA: Diagnosis not present

## 2020-02-19 DIAGNOSIS — R7309 Other abnormal glucose: Secondary | ICD-10-CM | POA: Diagnosis not present

## 2020-02-19 DIAGNOSIS — Z8546 Personal history of malignant neoplasm of prostate: Secondary | ICD-10-CM

## 2020-02-19 DIAGNOSIS — E7800 Pure hypercholesterolemia, unspecified: Secondary | ICD-10-CM | POA: Diagnosis not present

## 2020-02-19 DIAGNOSIS — I4819 Other persistent atrial fibrillation: Secondary | ICD-10-CM | POA: Diagnosis not present

## 2020-02-19 DIAGNOSIS — Z23 Encounter for immunization: Secondary | ICD-10-CM | POA: Diagnosis not present

## 2020-02-19 DIAGNOSIS — Z0000 Encounter for general adult medical examination without abnormal findings: Secondary | ICD-10-CM

## 2020-02-19 DIAGNOSIS — R413 Other amnesia: Secondary | ICD-10-CM

## 2020-02-19 MED ORDER — DONEPEZIL HCL 5 MG PO TABS: 5.0000 mg | ORAL_TABLET | Freq: Every day | ORAL | 0 refills | Status: DC

## 2020-02-19 NOTE — Patient Instructions (Signed)
Aricept/Donepezil tablets What is this medicine? DONEPEZIL (doe NEP e zil) is used to treat mild to moderate dementia caused by Alzheimer's disease. This medicine may be used for other purposes; ask your health care provider or pharmacist if you have questions. COMMON BRAND NAME(S): Aricept What should I tell my health care provider before I take this medicine? They need to know if you have any of these conditions:  asthma or other lung disease  difficulty passing urine  head injury  heart disease  history of irregular heartbeat  liver disease  seizures (convulsions)  stomach or intestinal disease, ulcers or stomach bleeding  an unusual or allergic reaction to donepezil, other medicines, foods, dyes, or preservatives  pregnant or trying to get pregnant  breast-feeding How should I use this medicine? Take this medicine by mouth with a glass of water. Follow the directions on the prescription label. You may take this medicine with or without food. Take this medicine at regular intervals. This medicine is usually taken before bedtime. Do not take it more often than directed. Continue to take your medicine even if you feel better. Do not stop taking except on your doctor's advice. If you are taking the 23 mg donepezil tablet, swallow it whole; do not cut, crush, or chew it. Talk to your pediatrician regarding the use of this medicine in children. Special care may be needed. Overdosage: If you think you have taken too much of this medicine contact a poison control center or emergency room at once. NOTE: This medicine is only for you. Do not share this medicine with others. What if I miss a dose? If you miss a dose, take it as soon as you can. If it is almost time for your next dose, take only that dose, do not take double or extra doses. What may interact with this medicine? Do not take this medicine with any of the following medications:  certain medicines for fungal infections  like itraconazole, fluconazole, posaconazole, and voriconazole  cisapride  dextromethorphan; quinidine  dronedarone  pimozide  quinidine  thioridazine This medicine may also interact with the following medications:  antihistamines for allergy, cough and cold  atropine  bethanechol  carbamazepine  certain medicines for bladder problems like oxybutynin, tolterodine  certain medicines for Parkinson's disease like benztropine, trihexyphenidyl  certain medicines for stomach problems like dicyclomine, hyoscyamine  certain medicines for travel sickness like scopolamine  dexamethasone  dofetilide  ipratropium  NSAIDs, medicines for pain and inflammation, like ibuprofen or naproxen  other medicines for Alzheimer's disease  other medicines that prolong the QT interval (cause an abnormal heart rhythm)  phenobarbital  phenytoin  rifampin, rifabutin or rifapentine  ziprasidone This list may not describe all possible interactions. Give your health care provider a list of all the medicines, herbs, non-prescription drugs, or dietary supplements you use. Also tell them if you smoke, drink alcohol, or use illegal drugs. Some items may interact with your medicine. What should I watch for while using this medicine? Visit your doctor or health care professional for regular checks on your progress. Check with your doctor or health care professional if your symptoms do not get better or if they get worse. You may get drowsy or dizzy. Do not drive, use machinery, or do anything that needs mental alertness until you know how this drug affects you. What side effects may I notice from receiving this medicine? Side effects that you should report to your doctor or health care professional as soon as possible:  allergic reactions like skin rash, itching or hives, swelling of the face, lips, or tongue  feeling faint or lightheaded, falls  loss of bladder control  seizures  signs and  symptoms of a dangerous change in heartbeat or heart rhythm like chest pain; dizziness; fast or irregular heartbeat; palpitations; feeling faint or lightheaded, falls; breathing problems  signs and symptoms of infection like fever or chills; cough; sore throat; pain or trouble passing urine  signs and symptoms of liver injury like dark yellow or brown urine; general ill feeling or flu-like symptoms; light-colored stools; loss of appetite; nausea; right upper belly pain; unusually weak or tired; yellowing of the eyes or skin  slow heartbeat or palpitations  unusual bleeding or bruising  vomiting Side effects that usually do not require medical attention (report to your doctor or health care professional if they continue or are bothersome):  diarrhea, especially when starting treatment  headache  loss of appetite  muscle cramps  nausea  stomach upset This list may not describe all possible side effects. Call your doctor for medical advice about side effects. You may report side effects to FDA at 1-800-FDA-1088. Where should I keep my medicine? Keep out of reach of children. Store at room temperature between 15 and 30 degrees C (59 and 86 degrees F). Throw away any unused medicine after the expiration date. NOTE: This sheet is a summary. It may not cover all possible information. If you have questions about this medicine, talk to your doctor, pharmacist, or health care provider.  2020 Elsevier/Gold Standard (2018-04-09 10:33:41)  

## 2020-02-19 NOTE — Progress Notes (Signed)
Patient: Charles Cook, Male    DOB: Dec 18, 1929, 86 y.o.   MRN: [id number] Visit Date: 02/19/2020  Today's Provider: Mar Daring, PA-C   No chief complaint on file.  Subjective    Charles Cook is a 86 y.o. male who presents today for his f/u to [facility] [facility] done on 02/04/20.  He reports consuming a general diet. Walks some He generally feels fairly well. He reports sleeping fairly well. Pt sometimes has a hard time falling asleep.  He does have additional problems to discuss today.   HPI He has been having a decreased appetite. He denies any nausea, vomiting, or abdominal pain. Just does not feel as hungry and when he does eat, just does not eat the full meal. He does eat good when they go out to eat like at [facility] or the fish house. He has lost about 5-6 pounds since February 2021.   He is also having issues sleeping. He reports that he just does not sleep well, awakens often. Not much issue falling asleep. Less active during the day.  Having worsening memory/dementia. Long term memory is intact. Has progressive short term memory issues. Repeating himself more. Has had 2 instances of hallucinations. Once he heard a door bell without a door bell being rung. The second time he saw a little girl in the driveway. He has had one instance where he had read an old story and did not realize it was older about a car being parked 150 ft from a house. He thought it was his house and he called 9-1-1. They did search and reassured him all was well. He was shown the article and reassured that it was an article from 6 months prior.    Patient Active Problem List   Diagnosis Date Noted   Pain due to onychomycosis of toenails of both feet 06/06/2019   Coagulation defect ([place]) 06/06/2019   Chronic idiopathic constipation 01/24/2019   Elevated glucose 01/24/2019   Acute post-hemorrhagic anemia    Osteopenia 03/13/2017   Sepsis ([place]) 10/07/2015   Syncope 10/05/2015    Congestive cardiomyopathy ([place]) 07/09/2015   Persistent atrial fibrillation ([place]) 07/09/2015   Gallstone 07/08/2015   Hx of extrinsic asthma 02/06/2015   Benign fibroma of prostate 12/16/2014   Arteriosclerosis of coronary artery 12/16/2014   Clinical depression 12/16/2014   Fracture of clavicle 12/16/2014   H/O: depression 12/16/2014   H/O coronary artery bypass surgery 12/16/2014   H/O malignant neoplasm of prostate 12/16/2014   HLD (hyperlipidemia) 12/16/2014   BP (high blood pressure) 12/16/2014   Infection with methicillin-resistant Staphylococcus aureus 12/16/2014   Diaphragmatic hernia 09/22/2006   Past Medical History:  Diagnosis Date   Atrial fibrillation ([place])    on eliquis   Bladder cancer ([place])    BPH (benign prostatic hyperplasia)    CAD (coronary artery disease)    s/p CABG in 2006   Cardiomyopathy [facility])    Depression    controlled;    Hiatal hernia    Hx MRSA infection    Hyperlipidemia    Hypertension    Myocardial infarction ([place])    Prostate cancer ([place])    Social History   Tobacco Use   Smoking status: Never Smoker   Smokeless tobacco: Never Used  Vaping Use   Vaping Use: Never used  Substance Use Topics   Alcohol use: Not Currently    Alcohol/week: 0.0 standard drinks   Drug use: No  Allergies  Allergen Reactions   Moxifloxacin Hcl In Nacl Other (See Comments)    Confusion      Medications: Outpatient Medications Prior to Visit  Medication Sig   acetaminophen (TYLENOL) 500 MG tablet Take 1,000 mg by mouth daily as needed for moderate pain or headache.   amiodarone (PACERONE) 200 MG tablet TAKE ONE TABLET BY MOUTH DAILY   apixaban (ELIQUIS) 2.5 MG TABS tablet Take 1 tablet (2.5 mg total) by mouth 2 (two) times daily.   aspirin EC 81 MG tablet Take 81 mg by mouth at bedtime.   Calcium Carbonate-Vitamin D (CALCIUM-D) 600-400 MG-UNIT TABS Take 1 tablet by mouth 2 (two) times daily.    Leuprolide  Acetate, 6 Month, (LUPRON DEPOT, 29-MONTH, IM) Inject into the muscle every 6 (six) months.   Melatonin 10 MG TABS Take 10 mg by mouth daily.   Multiple Vitamin (MULTIVITAMIN WITH MINERALS) TABS tablet Take 1 tablet by mouth daily.   niacin (NIASPAN) 1000 MG CR tablet Take 1,000 mg by mouth daily.   polyethylene glycol (MIRALAX / GLYCOLAX) packet Take 17 g by mouth daily as needed for mild constipation.    sertraline (ZOLOFT) 50 MG tablet Take 50 mg by mouth daily.   traZODone (DESYREL) 100 MG tablet TAKE ONE TABLET BY MOUTH AT BEDTIME   vitamin B-12 (CYANOCOBALAMIN) 500 MCG tablet Take 500 mcg by mouth daily.   denosumab (PROLIA) 60 MG/ML SOSY injection Inject 60 mg into the skin every 6 (six) months. (Patient not taking: Reported on 02/04/2020)   digoxin (LANOXIN) 0.125 MG tablet Take 1 tablet (125 mcg total) by mouth daily.   [DISCONTINUED] Cyanocobalamin (VITAMIN B-12) 5000 MCG SUBL Place 5,000 mcg under the tongue daily. (Patient not taking: Reported on 02/04/2020)   [DISCONTINUED] diphenhydrAMINE (BENADRYL) 25 MG tablet Take 25 mg by mouth at bedtime as needed for allergies.  (Patient not taking: Reported on 02/04/2020)   [DISCONTINUED] fluticasone (FLONASE) 50 MCG/ACT nasal spray USE 2 SPRAYS IN EACH NOSTRIL DAILY (Patient not taking: Reported on 02/04/2020)   No facility-administered medications prior to visit.    Allergies  Allergen Reactions   Moxifloxacin Hcl In Nacl Other (See Comments)    Confusion     Patient Care Team: Mar Daring, PA-C as PCP - General (Family Medicine) Ubaldo Glassing Javier Docker, MD as Consulting Physician (Cardiology) Sindy Guadeloupe, MD as Consulting Physician (Oncology) Gardiner Barefoot, DPM as Consulting Physician (Podiatry) Eugenie Birks, Christian Mate., MD (Dentistry)  Review of Systems  Constitutional: Positive for appetite change. Negative for activity change, chills, diaphoresis, fatigue, fever and unexpected weight change.  HENT: Negative.    Eyes: Negative.   Respiratory: Positive for shortness of breath. Negative for apnea, cough, choking, chest tightness, wheezing and stridor.   Cardiovascular: Negative.   Gastrointestinal: Negative.   Endocrine: Negative.   Genitourinary: Negative.   Musculoskeletal: Positive for arthralgias and neck pain. Negative for back pain, gait problem, joint swelling, myalgias and neck stiffness.  Skin: Negative.   Allergic/Immunologic: Negative.   Neurological: Negative.   Hematological: Negative.   Psychiatric/Behavioral: Positive for decreased concentration and hallucinations. Negative for agitation, behavioral problems, confusion, dysphoric mood, self-injury, sleep disturbance and suicidal ideas. The patient is not nervous/anxious and is not hyperactive.       Objective    Vitals: BP (!) 166/77 (BP Location: Left Arm, Patient Position: Sitting, Cuff Size: Normal)    Pulse 65    Temp 98.5 F (36.9 C) (Oral)    Wt 156 lb (70.8 kg)  SpO2 98%    BMI 21.76 kg/m    Physical Exam Constitutional:      General: He is not in acute distress.    Appearance: Normal appearance. He is well-developed and normal weight. He is not ill-appearing.  HENT:     Head: Normocephalic and atraumatic.     Right Ear: Tympanic membrane, ear canal and external ear normal.     Left Ear: Tympanic membrane, ear canal and external ear normal.  Eyes:     General:        Right eye: No discharge.        Left eye: No discharge.     Extraocular Movements: Extraocular movements intact.     Conjunctiva/sclera: Conjunctivae normal.     Pupils: Pupils are equal, round, and reactive to light.  Neck:     Thyroid: No thyromegaly.     Vascular: No carotid bruit.     Trachea: No tracheal deviation.  Cardiovascular:     Rate and Rhythm: Normal rate and regular rhythm.     Pulses: Normal pulses.     Heart sounds: Murmur heard.   Pulmonary:     Effort: Pulmonary effort is normal. No respiratory distress.     Breath  sounds: Normal breath sounds. No wheezing or rales.  Chest:     Chest wall: No tenderness.  Abdominal:     General: Abdomen is flat. Bowel sounds are normal. There is no distension.     Palpations: Abdomen is soft. There is no mass.     Tenderness: There is no abdominal tenderness. There is no guarding or rebound.  Musculoskeletal:        General: No tenderness. Normal range of motion.     Cervical back: Normal range of motion and neck supple. No tenderness.     Right lower leg: No edema.     Left lower leg: No edema.  Lymphadenopathy:     Cervical: No cervical adenopathy.  Skin:    General: Skin is warm and dry.     Capillary Refill: Capillary refill takes less than 2 seconds.     Findings: No erythema or rash.  Neurological:     General: No focal deficit present.     Mental Status: He is alert. Mental status is at baseline.     Cranial Nerves: No cranial nerve deficit.     Motor: No abnormal muscle tone.     Coordination: Coordination normal.     Deep Tendon Reflexes: Reflexes are normal and symmetric. Reflexes normal.  Psychiatric:        Mood and Affect: Mood normal.        Behavior: Behavior normal.        Thought Content: Thought content normal.        Judgment: Judgment normal.     Most recent functional status assessment: In your present state of health, do you have any difficulty performing the following activities: 02/19/2020  Hearing? N  Vision? N  Difficulty concentrating or making decisions? Y  Comment -  Walking or climbing stairs? Y  Comment -  Dressing or bathing? Y  Comment -  Doing errands, shopping? Y  Preparing Food and eating ? -  Comment -  Using the Toilet? -  Managing your Medications? -  Comment -  Managing your Finances? -  Comment -  Housekeeping or managing your Housekeeping? -  Comment -  Some recent data might be hidden   Most recent fall risk assessment: Fall Risk  02/19/2020  Falls in the past year? 1  Comment -  Number falls  in past yr: 1  Comment -  Injury with Fall? 0  Risk Factor Category  -  Risk for fall due to : History of fall(s)  Follow up Falls evaluation completed  Comment -    Most recent depression screenings: PHQ 2/9 Scores 02/19/2020 02/04/2020  PHQ - 2 Score 0 0  PHQ- 9 Score 11 -   Most recent cognitive screening: 6CIT Screen 02/04/2020  What Year? 0 points  What month? 0 points  What time? 3 points  Count back from 20 0 points  Months in reverse 4 points  Repeat phrase 4 points  Total Score 11   Most recent Audit-C alcohol use screening Alcohol Use Disorder Test (AUDIT) 02/19/2020  1. How often do you have a drink containing alcohol? 0  2. How many drinks containing alcohol do you have on a typical day when you are drinking? 0  3. How often do you have six or more drinks on one occasion? 0  AUDIT-C Score 0  Alcohol Brief Interventions/Follow-up AUDIT Score <7 follow-up not indicated   A score of 3 or more in women, and 4 or more in men indicates increased risk for alcohol abuse, EXCEPT if all of the points are from question 1   Results for orders placed or performed in visit on 02/19/20  CBC w/Diff/Platelet  Result Value Ref Range   WBC 9.9 3.4 - 10.8 x10E3/uL   RBC 4.14 4.14 - 5.80 x10E6/uL   Hemoglobin 13.3 13.0 - 17.7 g/dL   Hematocrit 40.5 37.5 - 51.0 %   MCV 98 (H) 79 - 97 fL   MCH 32.1 26.6 - 33.0 pg   MCHC 32.8 31 - 35 g/dL   RDW 13.1 11.6 - 15.4 %   Platelets 286 150 - 450 x10E3/uL   Neutrophils 64 Not Estab. %   Lymphs 17 Not Estab. %   Monocytes 14 Not Estab. %   Eos 4 Not Estab. %   Basos 0 Not Estab. %   Neutrophils Absolute 6.3 1.40 - 7.00 x10E3/uL   Lymphocytes Absolute 1.7 0 - 3 x10E3/uL   Monocytes Absolute 1.4 (H) 0 - 0 x10E3/uL   EOS (ABSOLUTE) 0.4 0.0 - 0.4 x10E3/uL   Basophils Absolute 0.0 0 - 0 x10E3/uL   Immature Granulocytes 1 Not Estab. %   Immature Grans (Abs) 0.1 0.0 - 0.1 x10E3/uL  Comprehensive Metabolic Panel (CMET)  Result Value Ref  Range   Glucose 81 65 - 99 mg/dL   BUN 11 8 - 27 mg/dL   Creatinine, Ser 0.93 0.76 - 1.27 mg/dL   GFR calc non Af Amer 73 >59 mL/min/1.73   GFR calc Af Amer 84 >59 mL/min/1.73   BUN/Creatinine Ratio 12 10 - 24   Sodium 133 (L) 134 - 144 mmol/L   Potassium CANCELED mmol/L   Chloride 94 (L) 96 - 106 mmol/L   CO2 23 20 - 29 mmol/L   Calcium 8.9 8.6 - 10.2 mg/dL   Total Protein 6.2 6.0 - 8.5 g/dL   Albumin 4.1 3.6 - 4.6 g/dL   Globulin, Total 2.1 1.5 - 4.5 g/dL   Albumin/Globulin Ratio 2.0 1.2 - 2.2   Bilirubin Total 0.4 0.0 - 1.2 mg/dL   Alkaline Phosphatase 105 44 - 121 IU/L   AST 90 (H) 0 - 40 IU/L   ALT 84 (H) 0 - 44 IU/L  PSA, total and free  Result  Value Ref Range   Prostate Specific Ag, Serum <0.1 0.0 - 4.0 ng/mL   PSA, Free <0.01 N/A ng/mL   PSA, Free Pct CANCELED %  HgB A1c  Result Value Ref Range   Hgb A1c MFr Bld 5.4 4.8 - 5.6 %   Est. average glucose Bld gHb Est-mCnc 108 mg/dL  Lipid Panel With LDL/HDL Ratio  Result Value Ref Range   Cholesterol, Total 233 (H) 100 - 199 mg/dL   Triglycerides 97 0 - 149 mg/dL   HDL 76 >39 mg/dL   VLDL Cholesterol Cal 17 5 - 40 mg/dL   LDL Chol Calc (NIH) 140 (H) 0 - 99 mg/dL   LDL/HDL Ratio 1.8 0.0 - 3.6 ratio    Assessment & Plan     Annual wellness visit done today including the all of the following: Reviewed patient's Family Medical History Reviewed and updated list of patient's medical providers Assessment of cognitive impairment was done Assessed patient's functional ability Established a written schedule for health screening services Health Risk Assessent Completed and Reviewed  Exercise Activities and Dietary recommendations Goals     Increase water intake     Recommend increasing water intake to 4 glasses a day.        Immunization History  Administered Date(s) Administered   Fluad Quad(high Dose 65+) 01/24/2019, 02/19/2020   Influenza, High Dose Seasonal PF 02/06/2015, 02/16/2016, 01/29/2018   PFIZER  SARS-COV-2 Vaccination 02/14/2020   Pneumococcal Polysaccharide-23 03/29/2000, 01/29/2018   Td 09/16/1996   Tdap 11/12/2010   Zoster 12/13/2011    Health Maintenance  Topic Date Due   PNA vac Low Risk Adult (2 of 2 - PCV13) 02/18/2021 (Originally 01/30/2019)   COVID-19 Vaccine (2 - Pfizer 2-dose series) 03/06/2020   TETANUS/TDAP  11/11/2020   INFLUENZA VACCINE  Completed     Discussed health benefits of physical activity, and encouraged him to engage in regular exercise appropriate for his age and condition.    Normal physical exam today. Will check labs as below and f/u pending lab results. If labs are stable and WNL he will not need to have these rechecked for one year at his next annual physical exam. He is to call the office in the meantime if he has any acute issue, questions or concerns.  2. Need for influenza vaccination Flu vaccine given today without complication. Patient sat upright for 15 minutes to check for adverse reaction before being released. - Flu Vaccine QUAD High Dose(Fluad)  3. Primary hypertension Stable. Followed by Cardiology. Continue Amiodarone, digoxin for atrial fibrillation. Will check labs as below and f/u pending results. - CBC w/Diff/Platelet - Comprehensive Metabolic Panel (CMET)  4. Persistent atrial fibrillation (HCC) Stable. Followed by Cardiology. Continue Amiodarone, eliquis, digoxin.  - CBC w/Diff/Platelet - Comprehensive Metabolic Panel (CMET) - Lipid Panel With LDL/HDL Ratio  5. Elevated glucose Diet controlled. Will check labs as below and f/u pending results. - CBC w/Diff/Platelet - Comprehensive Metabolic Panel (CMET) - HgB A1c  6. Memory loss Worsening. Will start Donepezil as below. Referral to Neurology placed for worsening memory. Will f/u medications in 4-6 weeks if needed per Neurology appt.  - donepezil (ARICEPT) 5 MG tablet; Take 1 tablet (5 mg total) by mouth at bedtime.  Dispense: 30 tablet; Refill: 0 -  Ambulatory referral to Neurology - CBC w/Diff/Platelet  7. Hypercholesterolemia On Niacin. Will check labs as below and f/u pending results. - CBC w/Diff/Platelet - Comprehensive Metabolic Panel (CMET) - Lipid Panel With LDL/HDL Ratio  8. H/O  malignant neoplasm of prostate Stable. On Lupron. Followed by Urology. Will check labs as below and f/u pending results. - PSA, total and free   Return in about 6 months (around 08/19/2020).     Reynolds Bowl, PA-C, have reviewed all documentation for this visit. The documentation on 02/20/20 for the exam, diagnosis, procedures, and orders are all accurate and complete.   [place]  [facility] [phone] (phone) [phone] (fax)  [place]

## 2020-02-20 LAB — CBC WITH DIFFERENTIAL/PLATELET
Basophils Absolute: 0 10*3/uL (ref 0.0–0.2)
Basos: 0 %
EOS (ABSOLUTE): 0.4 10*3/uL (ref 0.0–0.4)
Eos: 4 %
Hematocrit: 40.5 % (ref 37.5–51.0)
Hemoglobin: 13.3 g/dL (ref 13.0–17.7)
Immature Grans (Abs): 0.1 10*3/uL (ref 0.0–0.1)
Immature Granulocytes: 1 %
Lymphocytes Absolute: 1.7 10*3/uL (ref 0.7–3.1)
Lymphs: 17 %
MCH: 32.1 pg (ref 26.6–33.0)
MCHC: 32.8 g/dL (ref 31.5–35.7)
MCV: 98 fL — ABNORMAL HIGH (ref 79–97)
Monocytes Absolute: 1.4 10*3/uL — ABNORMAL HIGH (ref 0.1–0.9)
Monocytes: 14 %
Neutrophils Absolute: 6.3 10*3/uL (ref 1.4–7.0)
Neutrophils: 64 %
Platelets: 286 10*3/uL (ref 150–450)
RBC: 4.14 x10E6/uL (ref 4.14–5.80)
RDW: 13.1 % (ref 11.6–15.4)
WBC: 9.9 10*3/uL (ref 3.4–10.8)

## 2020-02-20 LAB — COMPREHENSIVE METABOLIC PANEL
ALT: 84 IU/L — ABNORMAL HIGH (ref 0–44)
AST: 90 IU/L — ABNORMAL HIGH (ref 0–40)
Albumin/Globulin Ratio: 2 (ref 1.2–2.2)
Albumin: 4.1 g/dL (ref 3.6–4.6)
Alkaline Phosphatase: 105 IU/L (ref 44–121)
BUN/Creatinine Ratio: 12 (ref 10–24)
BUN: 11 mg/dL (ref 8–27)
Bilirubin Total: 0.4 mg/dL (ref 0.0–1.2)
CO2: 23 mmol/L (ref 20–29)
Calcium: 8.9 mg/dL (ref 8.6–10.2)
Chloride: 94 mmol/L — ABNORMAL LOW (ref 96–106)
Creatinine, Ser: 0.93 mg/dL (ref 0.76–1.27)
GFR calc Af Amer: 84 mL/min/{1.73_m2} (ref >59)
GFR calc non Af Amer: 73 mL/min/{1.73_m2} (ref >59)
Globulin, Total: 2.1 g/dL (ref 1.5–4.5)
Glucose: 81 mg/dL (ref 65–99)
Sodium: 133 mmol/L — ABNORMAL LOW (ref 134–144)
Total Protein: 6.2 g/dL (ref 6.0–8.5)

## 2020-02-20 LAB — PSA, TOTAL AND FREE
PSA, Free: <0.01 ng/mL
Prostate Specific Ag, Serum: <0.1 ng/mL (ref 0.0–4.0)

## 2020-02-20 LAB — LIPID PANEL WITH LDL/HDL RATIO
Cholesterol, Total: 233 mg/dL — ABNORMAL HIGH (ref 100–199)
HDL: 76 mg/dL (ref >39)
LDL Chol Calc (NIH): 140 mg/dL — ABNORMAL HIGH (ref 0–99)
LDL/HDL Ratio: 1.8 ratio (ref 0.0–3.6)
Triglycerides: 97 mg/dL (ref 0–149)
VLDL Cholesterol Cal: 17 mg/dL (ref 5–40)

## 2020-02-20 LAB — HEMOGLOBIN A1C
Est. average glucose Bld gHb Est-mCnc: 108 mg/dL
Hgb A1c MFr Bld: 5.4 % (ref 4.8–5.6)

## 2020-02-21 ENCOUNTER — Other Ambulatory Visit: Payer: Self-pay

## 2020-02-21 DIAGNOSIS — R748 Abnormal levels of other serum enzymes: Secondary | ICD-10-CM

## 2020-02-24 ENCOUNTER — Ambulatory Visit (INDEPENDENT_AMBULATORY_CARE_PROVIDER_SITE_OTHER): Payer: Medicare Other | Admitting: Urology

## 2020-02-24 ENCOUNTER — Other Ambulatory Visit: Payer: Self-pay

## 2020-02-24 DIAGNOSIS — C61 Malignant neoplasm of prostate: Secondary | ICD-10-CM

## 2020-02-24 MED ORDER — LEUPROLIDE ACETATE (6 MONTH) 45 MG ~~LOC~~ KIT
45.0000 mg | PACK | Freq: Once | SUBCUTANEOUS | Status: AC
  Administered 2020-02-24: 45 mg via SUBCUTANEOUS

## 2020-02-24 NOTE — Patient Instructions (Signed)

## 2020-02-24 NOTE — Progress Notes (Signed)
Eligard SubQ Injection   Due to Prostate Cancer patient is present today for a Eligard Injection.  Medication: Eligard 6 month Dose: 45 mg  Location: left upper outer arm   Lot: [id number] Exp: 06/2021  Patient tolerated well, no complications were noted.  Performed by: Gordy Clement, [place]   Patient's next follow up is scheduled for April 2022, per last note today is last Eligard injection. Patient encouraged to continue on Vitamin D 800-1000iu and Calium 1000-1200mg  daily while on Androgen Deprivation Therapy.  PA approval dates: No PA needed.

## 2020-03-03 DIAGNOSIS — I10 Essential (primary) hypertension: Secondary | ICD-10-CM | POA: Diagnosis not present

## 2020-03-03 DIAGNOSIS — E782 Mixed hyperlipidemia: Secondary | ICD-10-CM | POA: Diagnosis not present

## 2020-03-03 DIAGNOSIS — I4819 Other persistent atrial fibrillation: Secondary | ICD-10-CM | POA: Diagnosis not present

## 2020-03-03 DIAGNOSIS — I420 Dilated cardiomyopathy: Secondary | ICD-10-CM | POA: Diagnosis not present

## 2020-03-03 DIAGNOSIS — I25810 Atherosclerosis of coronary artery bypass graft(s) without angina pectoris: Secondary | ICD-10-CM | POA: Diagnosis not present

## 2020-03-12 ENCOUNTER — Ambulatory Visit (INDEPENDENT_AMBULATORY_CARE_PROVIDER_SITE_OTHER): Payer: Medicare Other

## 2020-03-12 ENCOUNTER — Other Ambulatory Visit: Payer: Self-pay

## 2020-03-12 DIAGNOSIS — Z23 Encounter for immunization: Secondary | ICD-10-CM

## 2020-03-16 ENCOUNTER — Other Ambulatory Visit: Payer: Self-pay | Admitting: Physician Assistant

## 2020-03-16 DIAGNOSIS — R413 Other amnesia: Secondary | ICD-10-CM

## 2020-03-16 NOTE — Telephone Encounter (Signed)
Requested medication (s) are due for refill today: yes  Requested medication (s) are on the active medication list: yes  Last refill: 02/19/20 #30  0 refills  Future visit scheduled: No  Notes to clinic: Last OV 02/19/20 states will follow up after neurology visit. Neuro visit not in chart. Please refill if appropriate.    Requested Prescriptions  Pending Prescriptions Disp Refills   donepezil (ARICEPT) 5 MG tablet [Pharmacy Med Name: Donepezil HCl 5 MG Oral Tablet] 30 tablet 0    Sig: TAKE 1 TABLET BY MOUTH AT BEDTIME      Neurology:  Alzheimer's Agents Failed - 03/16/2020  2:26 PM      Failed - Valid encounter within last 6 months    Recent Outpatient Visits           9 months ago Neck pain   [facility] [name] M, [place]   2 years ago Left-sided chest pain   [facility] Carles Collet M, [place]   2 years ago Postprocedural hypotension   [place], Clearnce Sorrel, [place]   2 years ago S/P cholecystectomy   [place], Clearnce Sorrel, [place]   2 years ago Transition of care performed with sharing of clinical summary   [place], Clearnce Sorrel, PA-C       Future Appointments             In 5 months [place], Clearnce Sorrel, PA-C [facility], [place]   In 5 months Hollice Espy, [facility]

## 2020-03-17 ENCOUNTER — Ambulatory Visit: Payer: Self-pay

## 2020-03-17 ENCOUNTER — Emergency Department: Payer: Medicare Other

## 2020-03-17 ENCOUNTER — Emergency Department
Admission: EM | Admit: 2020-03-17 | Discharge: 2020-03-17 | Disposition: A | Discharge status: Home / Self Care | Payer: Medicare Other | Attending: Emergency Medicine | Admitting: Emergency Medicine

## 2020-03-17 ENCOUNTER — Other Ambulatory Visit: Payer: Self-pay

## 2020-03-17 DIAGNOSIS — I4819 Other persistent atrial fibrillation: Secondary | ICD-10-CM | POA: Diagnosis not present

## 2020-03-17 DIAGNOSIS — R0902 Hypoxemia: Secondary | ICD-10-CM | POA: Diagnosis not present

## 2020-03-17 DIAGNOSIS — Z7901 Long term (current) use of anticoagulants: Secondary | ICD-10-CM

## 2020-03-17 DIAGNOSIS — R7401 Elevation of levels of liver transaminase levels: Secondary | ICD-10-CM

## 2020-03-17 DIAGNOSIS — R4182 Altered mental status, unspecified: Secondary | ICD-10-CM | POA: Diagnosis not present

## 2020-03-17 DIAGNOSIS — I10 Essential (primary) hypertension: Secondary | ICD-10-CM | POA: Insufficient documentation

## 2020-03-17 DIAGNOSIS — R7989 Other specified abnormal findings of blood chemistry: Secondary | ICD-10-CM

## 2020-03-17 DIAGNOSIS — D72829 Elevated white blood cell count, unspecified: Secondary | ICD-10-CM | POA: Diagnosis not present

## 2020-03-17 DIAGNOSIS — Z7982 Long term (current) use of aspirin: Secondary | ICD-10-CM | POA: Insufficient documentation

## 2020-03-17 DIAGNOSIS — Z8551 Personal history of malignant neoplasm of bladder: Secondary | ICD-10-CM | POA: Insufficient documentation

## 2020-03-17 DIAGNOSIS — Z951 Presence of aortocoronary bypass graft: Secondary | ICD-10-CM | POA: Diagnosis not present

## 2020-03-17 DIAGNOSIS — Z79899 Other long term (current) drug therapy: Secondary | ICD-10-CM | POA: Insufficient documentation

## 2020-03-17 DIAGNOSIS — R531 Weakness: Secondary | ICD-10-CM | POA: Diagnosis not present

## 2020-03-17 DIAGNOSIS — Z20822 Contact with and (suspected) exposure to covid-19: Secondary | ICD-10-CM | POA: Insufficient documentation

## 2020-03-17 DIAGNOSIS — I2510 Atherosclerotic heart disease of native coronary artery without angina pectoris: Secondary | ICD-10-CM | POA: Insufficient documentation

## 2020-03-17 DIAGNOSIS — R778 Other specified abnormalities of plasma proteins: Secondary | ICD-10-CM | POA: Diagnosis not present

## 2020-03-17 DIAGNOSIS — R5383 Other fatigue: Secondary | ICD-10-CM

## 2020-03-17 DIAGNOSIS — R29898 Other symptoms and signs involving the musculoskeletal system: Secondary | ICD-10-CM | POA: Diagnosis not present

## 2020-03-17 DIAGNOSIS — F32A Depression, unspecified: Secondary | ICD-10-CM | POA: Diagnosis not present

## 2020-03-17 HISTORY — DX: Hypo-osmolality and hyponatremia: E87.1

## 2020-03-17 LAB — URINALYSIS, COMPLETE (UACMP) WITH MICROSCOPIC
Bacteria, UA: NONE SEEN
Bilirubin Urine: NEGATIVE
Glucose, UA: NEGATIVE mg/dL
Hgb urine dipstick: NEGATIVE
Ketones, ur: 5 mg/dL — AB
Leukocytes,Ua: NEGATIVE
Nitrite: NEGATIVE
Protein, ur: 30 mg/dL — AB
Specific Gravity, Urine: 1.03 (ref 1.005–1.030)
pH: 5 (ref 5.0–8.0)

## 2020-03-17 LAB — CBC
HCT: 39.4 % (ref 39.0–52.0)
Hemoglobin: 13.2 g/dL (ref 13.0–17.0)
MCH: 32.9 pg (ref 26.0–34.0)
MCHC: 33.5 g/dL (ref 30.0–36.0)
MCV: 98.3 fL (ref 80.0–100.0)
Platelets: 314 10*3/uL (ref 150–400)
RBC: 4.01 MIL/uL — ABNORMAL LOW (ref 4.22–5.81)
RDW: 12.9 % (ref 11.5–15.5)
WBC: 12.6 10*3/uL — ABNORMAL HIGH (ref 4.0–10.5)
nRBC: 0 % (ref 0.0–0.2)

## 2020-03-17 LAB — RESPIRATORY PANEL BY RT PCR (FLU A&B, COVID)
Influenza A by PCR: NEGATIVE
Influenza B by PCR: NEGATIVE
SARS Coronavirus 2 by RT PCR: NEGATIVE

## 2020-03-17 LAB — TROPONIN I (HIGH SENSITIVITY)
Troponin I (High Sensitivity): 31 ng/L — ABNORMAL HIGH (ref <18)
Troponin I (High Sensitivity): 30 ng/L — ABNORMAL HIGH (ref <18)

## 2020-03-17 LAB — BASIC METABOLIC PANEL
Anion gap: 9 (ref 5–15)
BUN: 13 mg/dL (ref 8–23)
CO2: 28 mmol/L (ref 22–32)
Calcium: 8.5 mg/dL — ABNORMAL LOW (ref 8.9–10.3)
Chloride: 93 mmol/L — ABNORMAL LOW (ref 98–111)
Creatinine, Ser: 0.96 mg/dL (ref 0.61–1.24)
GFR, Estimated: >60 mL/min (ref >60)
Glucose, Bld: 130 mg/dL — ABNORMAL HIGH (ref 70–99)
Potassium: 4.2 mmol/L (ref 3.5–5.1)
Sodium: 130 mmol/L — ABNORMAL LOW (ref 135–145)

## 2020-03-17 LAB — ACETAMINOPHEN LEVEL: Acetaminophen (Tylenol), Serum: <10 ug/mL — ABNORMAL LOW (ref 10–30)

## 2020-03-17 LAB — HEPATIC FUNCTION PANEL
ALT: 186 U/L — ABNORMAL HIGH (ref 0–44)
AST: 237 U/L — ABNORMAL HIGH (ref 15–41)
Albumin: 3.5 g/dL (ref 3.5–5.0)
Alkaline Phosphatase: 110 U/L (ref 38–126)
Bilirubin, Direct: 0.2 mg/dL (ref 0.0–0.2)
Indirect Bilirubin: 0.7 mg/dL (ref 0.3–0.9)
Total Bilirubin: 0.9 mg/dL (ref 0.3–1.2)
Total Protein: 6.5 g/dL (ref 6.5–8.1)

## 2020-03-17 LAB — MAGNESIUM: Magnesium: 2.1 mg/dL (ref 1.7–2.4)

## 2020-03-17 LAB — CK: Total CK: 38 U/L — ABNORMAL LOW (ref 49–397)

## 2020-03-17 LAB — DIGOXIN LEVEL: Digoxin Level: 1.6 ng/mL (ref 1.0–2.0)

## 2020-03-17 LAB — TSH: TSH: 0.626 u[IU]/mL (ref 0.350–4.500)

## 2020-03-17 LAB — PROCALCITONIN: Procalcitonin: <0.1 ng/mL

## 2020-03-17 NOTE — ED Notes (Signed)
Report off to julia rn

## 2020-03-17 NOTE — ED Provider Notes (Signed)
-----------------------------------------   9:25 PM on 03/17/2020 -----------------------------------------  Patient care assumed from Dr. Tamala Julian.  Overall patient appears well, his digoxin level is within the normal/therapeutic window.  Troponin is unchanged.  Patient is feeling better and strongly wishes to go home.  He is hungry and wants something to eat.  We will discharge the patient with PCP follow-up.  Patient/family agreeable.   Harvest Dark, MD 03/17/20 [id number]

## 2020-03-17 NOTE — Telephone Encounter (Signed)
Pt poor appetite x 1 week, poor po fluid intake (4 oxz juice today), vomiting, weakness and difficulty walking per daughter).    Spoke with pt's [place] aide Hamila  Pt is alert and talking. Unable to get pt to drink fluids.  Only has had 4 oz- of intake today. Pt denied weakness or numbness to face either arms or legs. Pt is having generalized weakness.   Advised daughter and home health aide pt needs to go  ED for evaluation. Nelly Rout stated she will call 911 for EMS to assess pt. Pt had 2nd Covid vaccine 03/12/20. Routing to [facility].   Reason for Disposition . Patient sounds very sick or weak to the triager  Answer Assessment - Initial Assessment Questions 1. COVID-19 DIAGNOSIS: "Who made your Coronavirus (COVID-19) diagnosis?" "Was it confirmed by a positive lab test?" If not diagnosed by a HCP, ask "Are there lots of cases (community spread) where you live?" (See public health department website, if unsure)     n/a 2. COVID-19 EXPOSURE: "Was there any known exposure to [place] before the symptoms began?" CDC Definition of close contact: within 6 feet (2 meters) for a total of 15 minutes or more over a 24-hour period.      no 3. ONSET: "When did the COVID-19 symptoms start?"      Last week 4. WORST SYMPTOM: "What is your worst symptom?" (e.g., cough, fever, shortness of breath, muscle aches)     Weakness, no appetite, vomiting 5. COUGH: "Do you have a cough?" If Yes, ask: "How bad is the cough?"       Yes- mild 6. FEVER: "Do you have a fever?" If Yes, ask: "What is your temperature, how was it measured, and when did it start?"     no 7. RESPIRATORY STATUS: "Describe your breathing?" (e.g., shortness of breath, wheezing, unable to speak)       8. BETTER-SAME-WORSE: "Are you getting better, staying the same or getting worse compared to yesterday?"  If getting worse, ask, "In what way?"     worse 9. HIGH RISK DISEASE: "Do you have any chronic medical problems?" (e.g., asthma, heart or lung disease,  weak immune system, obesity, etc.)     Heart disease, h/o MRSA and sepsis 10. PREGNANCY: "Is there any chance you are pregnant?" "When was your last menstrual period?"      n/a 11. OTHER SYMPTOMS: "Do you have any other symptoms?"  (e.g., chills, fatigue, headache, loss of smell or taste, muscle pain, sore throat; new loss of smell or taste especially support the diagnosis of COVID-19) Difficulty walking, fatigue, hot flashes, dehydration,  Protocols used: CORONAVIRUS (COVID-19) DIAGNOSED OR SUSPECTED-A-AH

## 2020-03-17 NOTE — Discharge Instructions (Addendum)
You have been seen in the emergency department today for weakness.  Your work-up is overall reassuring.  Weakness can occasionally be a result of amiodarone one of the medications you are taking.  Please follow-up with your doctor to discuss this further.  Return to the emergency department for any symptom personally concerning to yourself.

## 2020-03-17 NOTE — ED Triage Notes (Addendum)
Pt comes into the ED via EMS from home, has home health and daughter that are concerned that he is not able to get up as usual, increased weakness, decreased fluid intake, hx of hyponatremia. CBG 71 Pt is a/ox4, denies any pain or dizziness
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
## 2020-03-17 NOTE — ED Notes (Signed)
Pt reports weakness for several days.  No n/v/d.  Hx hyponatremia.  No chest pain or sob.  Pt alert  Speech clear.  Family with pt.

## 2020-03-18 IMAGING — CR DG CHEST 2V
2 series · 2 of 2 positions shown · non-contrast
Comparison: Chest x-ray dated June 17, 2017.

CLINICAL DATA: Abdominal pain.

EXAM:
CHEST - 2 VIEW

[chest lat]
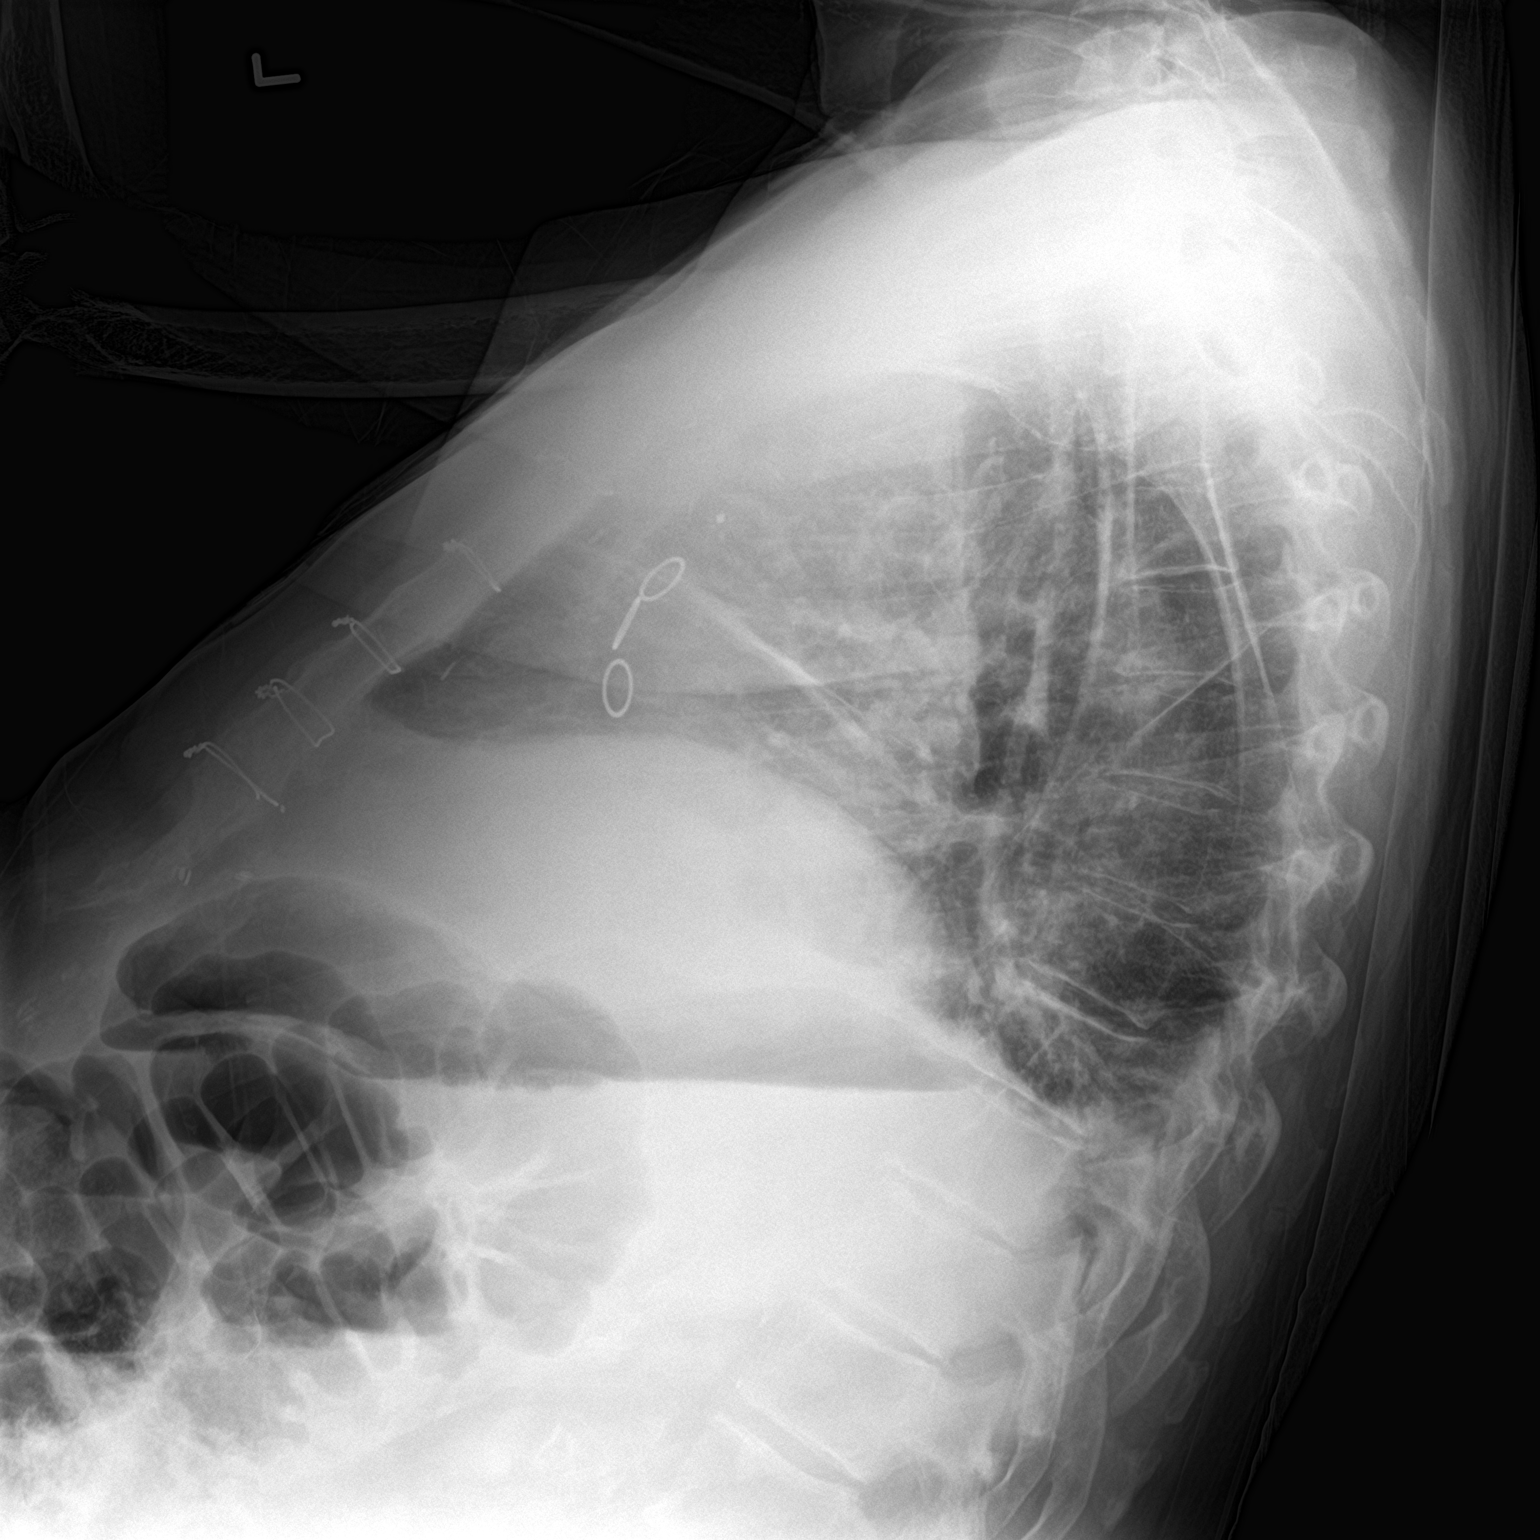

[chest ap]
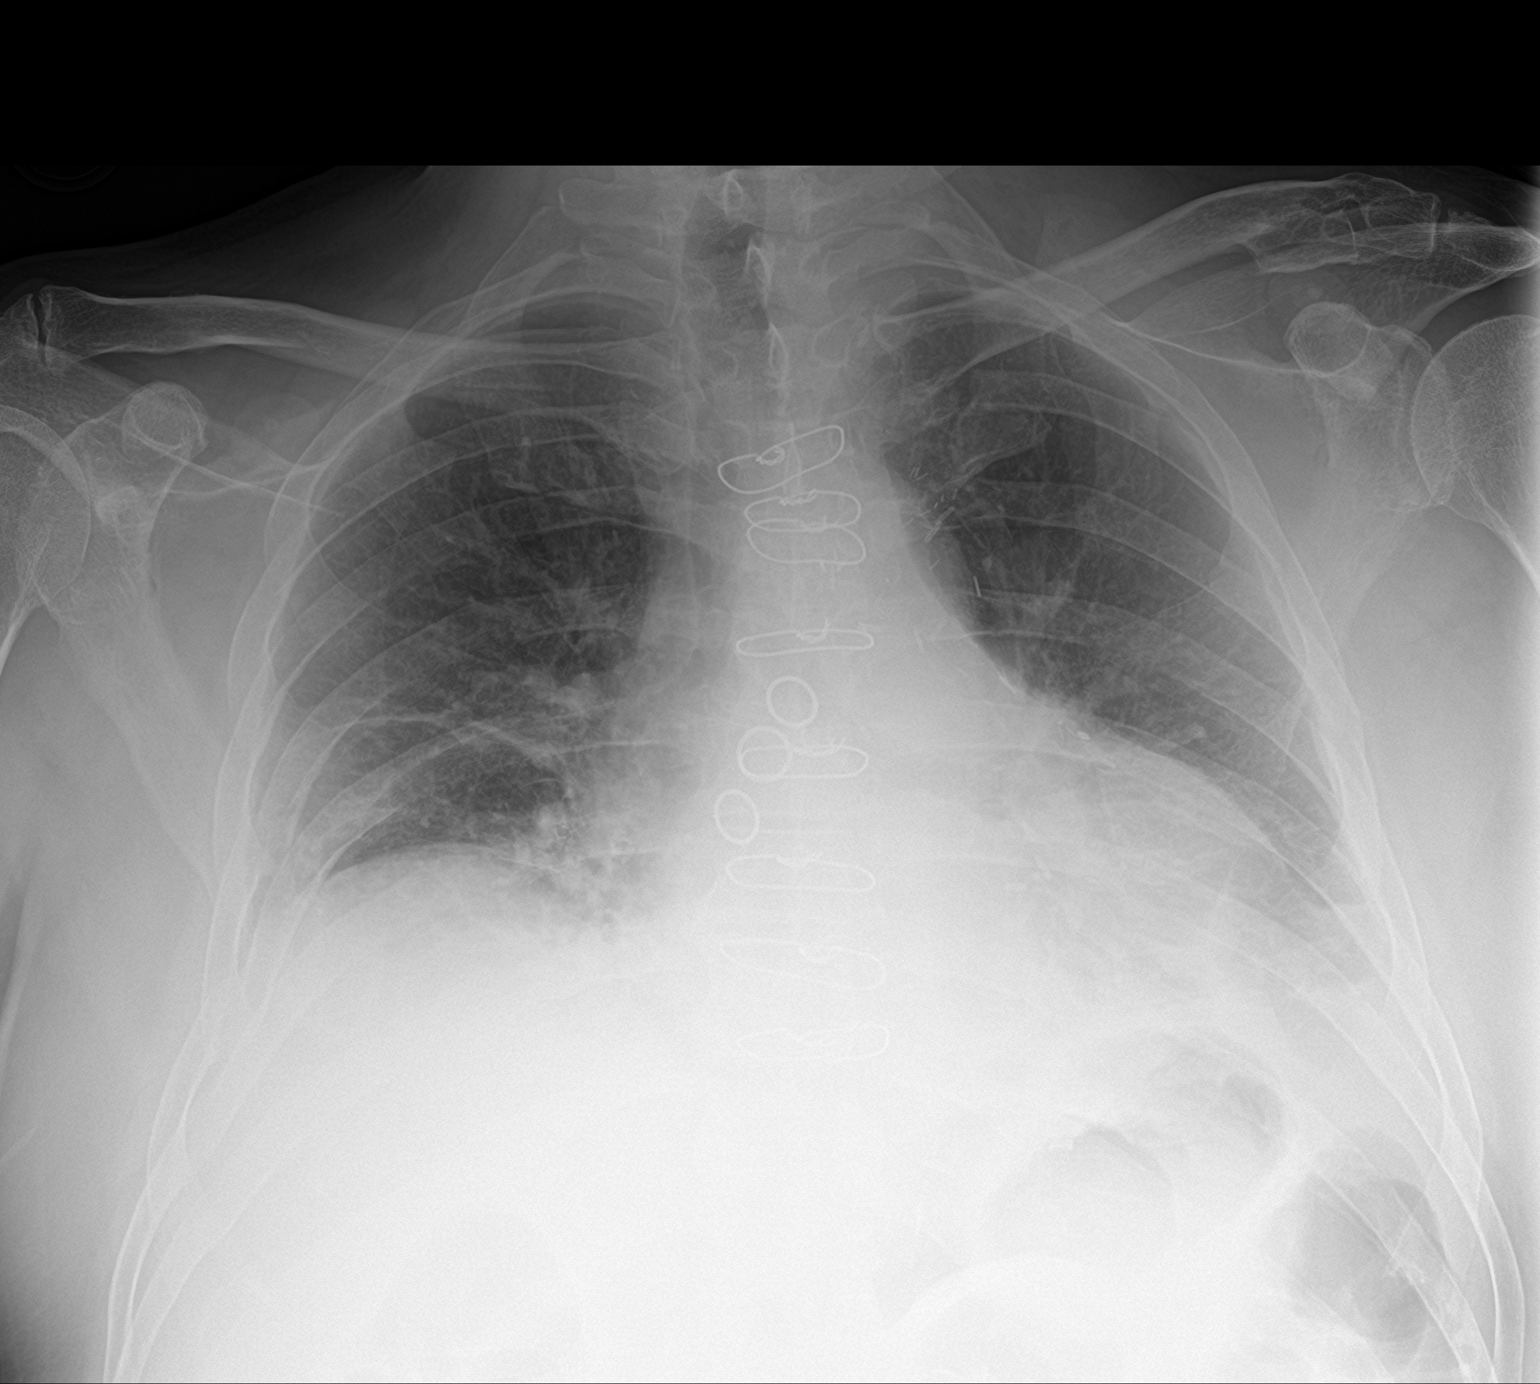

[2 of 2 positions shown; findings below may reference images not displayed]

FINDINGS: Stable mild cardiomegaly status post CABG. Normal pulmonary
vascularity. Bibasilar atelectasis. No focal consolidation, pleural
effusion, or pneumothorax. No acute osseous abnormality.
IMPRESSION: Bibasilar atelectasis.

## 2020-03-18 IMAGING — CT CT ABD-PELV W/ CM
2 of 5 series · 15 of 46 positions shown, 17 images · IV contrast (APPLIED)
Comparison: CT of the abdomen and pelvis 03/29/2016

CLINICAL DATA: Pt states "I'm unable to satisfy my hunger." states
pressure in upper abd. Pt states he can't eat even though he's
hungry. Symptoms x 1 week. Some NANDV. Denies diarrhea and
constipation. History of prostate cancer.

EXAM:
CT ABDOMEN AND PELVIS WITH CONTRAST
TECHNIQUE: Multidetector CT imaging of the abdomen and pelvis was performed
using the standard protocol following bolus administration of
intravenous contrast.
CONTRAST:  100mL OMNIPAQUE IOHEXOL 300 MG/ML SOLN, 30mL 23NCPA-XKK
IOPAMIDOL (23NCPA-XKK) INJECTION 61%

[Series 2: axial st · axial · 0.88mm/px · z∈[-1084,-599]mm · 12 of 109 slices shown, 14 images]
[im 6/109  soft-tissue]
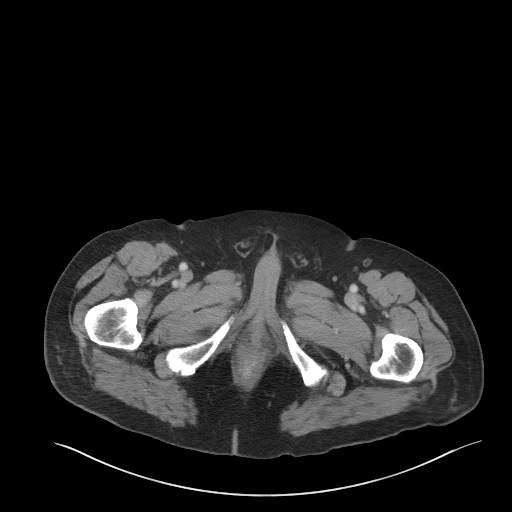
[im 6/109  bone]
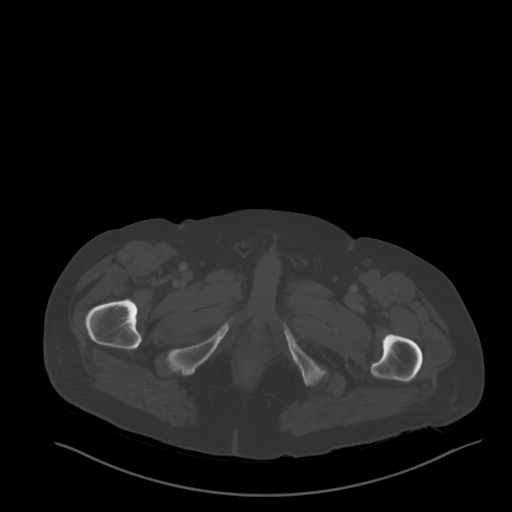
[im 18/109  soft-tissue]
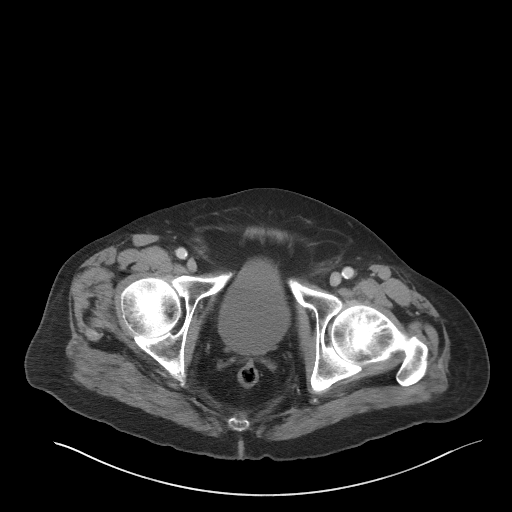
[im 23/109  soft-tissue]
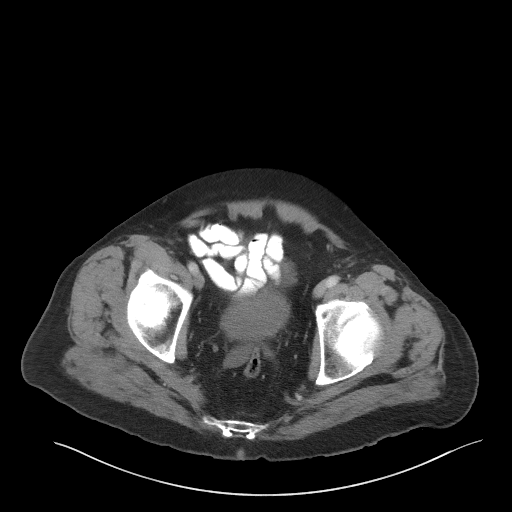
[im 35/109  soft-tissue]
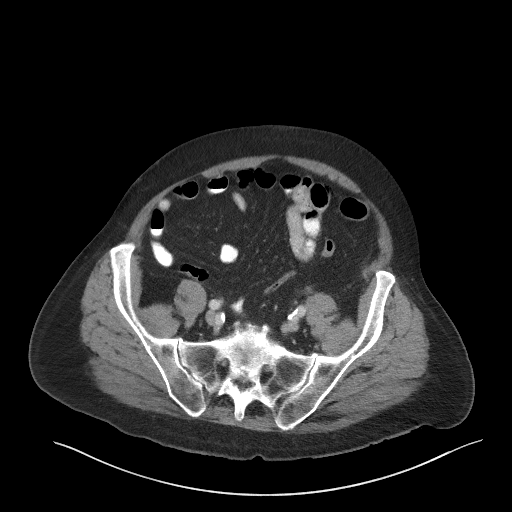
[im 40/109  soft-tissue]
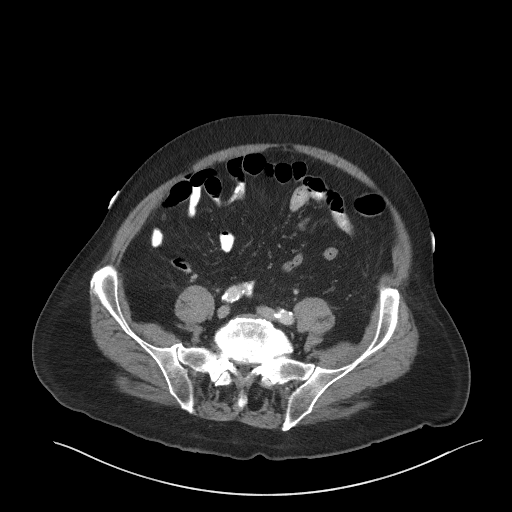
[im 52/109  soft-tissue]
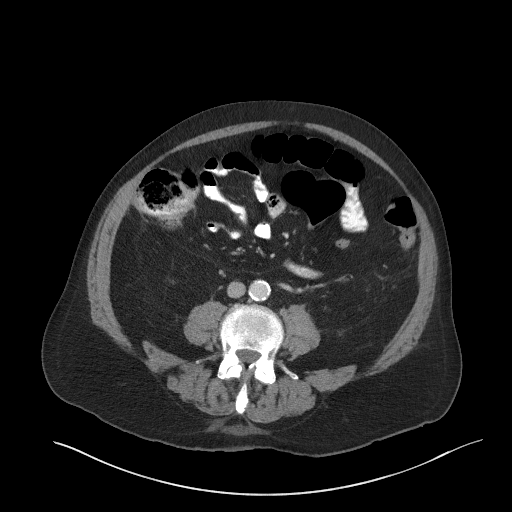
[im 57/109  soft-tissue]
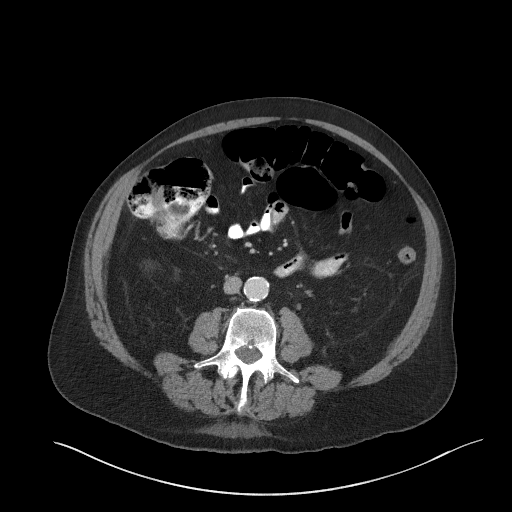
[im 69/109  soft-tissue]
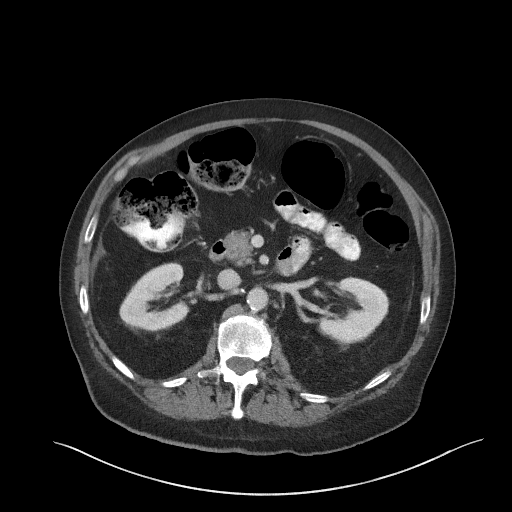
[im 74/109  soft-tissue]
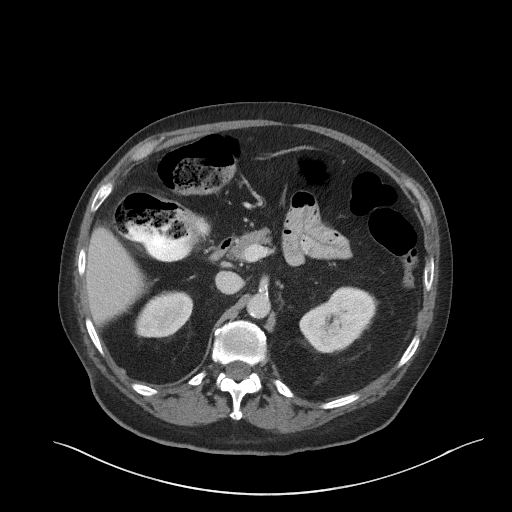
[im 74/109  bone]
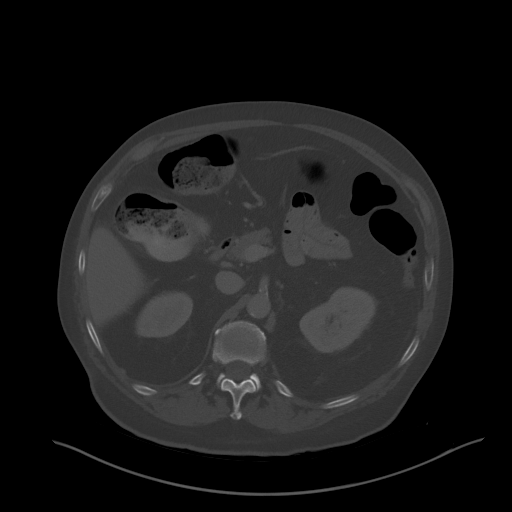
[im 86/109  soft-tissue]
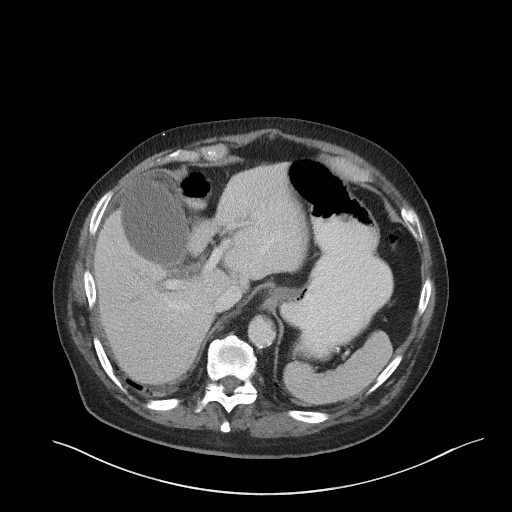
[im 91/109  soft-tissue]
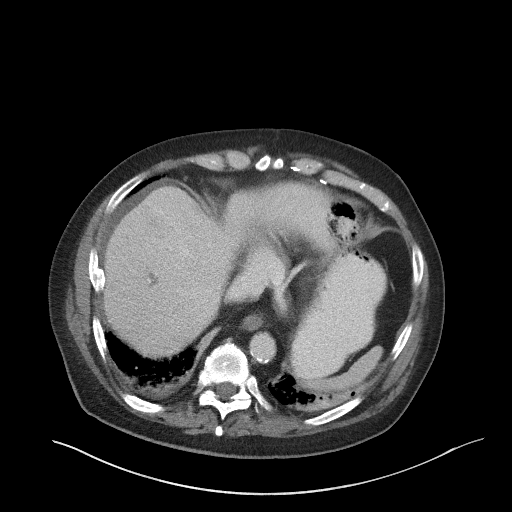
[im 103/109  soft-tissue]
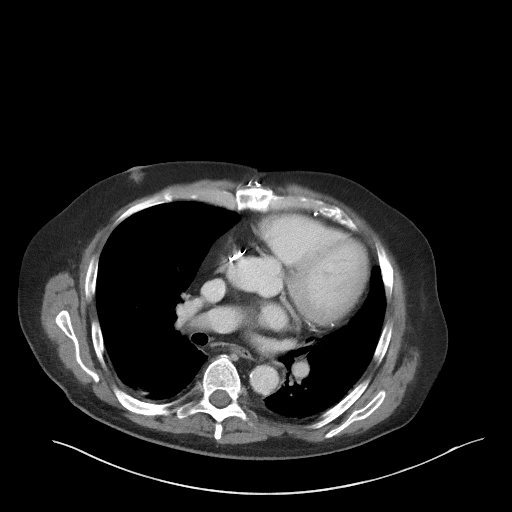

[Series 5: coronal st · coronal · 0.76mm/px · 3 of 113 slices shown]
[im 38/113  soft-tissue]
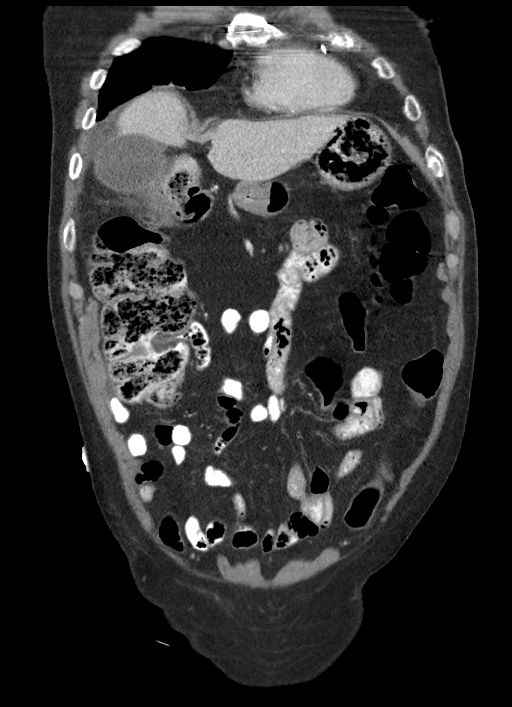
[im 50/113  soft-tissue]
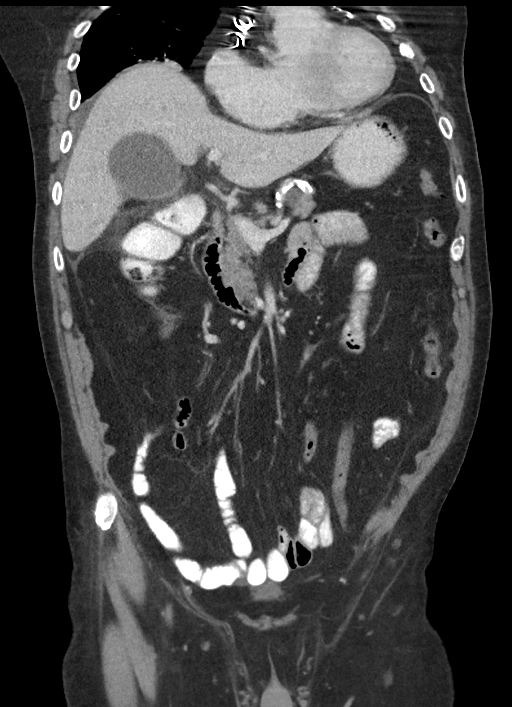
[im 63/113  soft-tissue]
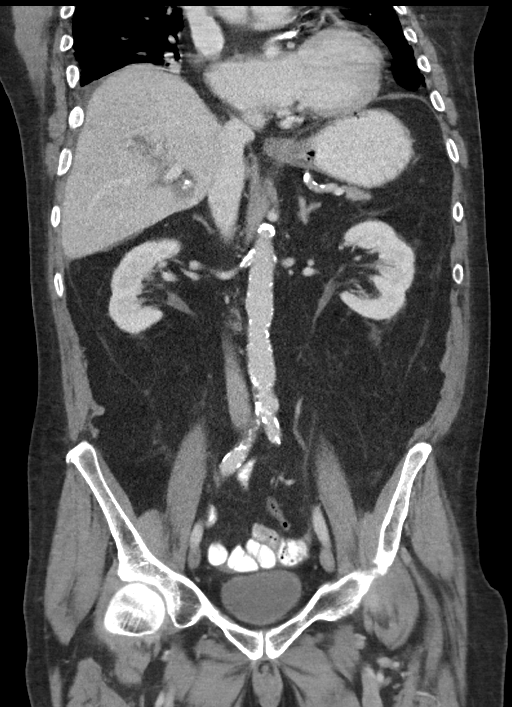

[15 of 46 positions shown; findings below may reference images not displayed]

FINDINGS: Lower chest: There is bibasilar atelectasis or scarring. The heart
is mildly enlarged. There is coronary artery calcification. Median
sternotomy and CABG.

Hepatobiliary: Gallbladder is distended and contains numerous faint
stones, bearing in size from 0.6-2.0 centimeters. There is mild
pericholecystic stranding and a small amount of perihepatic fluid.
Mild dilatation of the intrahepatic ducts.

Pancreas: Within the mid body of the pancreas there is a
low-attenuation lesion measuring 1.8 centimeters and stable in
appearance. A similar low-attenuation lesion is identified in the
pancreatic tail, measuring 8 millimeters.

Spleen: Normal in size without focal abnormality.

Adrenals/Urinary Tract: Adrenal glands are normal in appearance.
Symmetric enhancement and excretion from both kidneys. No
hydronephrosis or ureteral obstruction. Urinary bladder is normal in
appearance.

Stomach/Bowel: Stomach and small bowel loops are normal in
appearance. The colon is normal in appearance. The appendix is well
seen and has a normal appearance.

Vascular/Lymphatic: There is dense atherosclerotic calcification not
associated of the abdominal aorta with aneurysm. Although involved
by atherosclerosis, there is vascular opacification of the celiac
axis, superior mesenteric artery, and inferior mesenteric artery.
Normal appearance of the portal venous system and inferior vena
cava. No significant adenopathy. 1.7 centimeter LEFT external iliac
lymph node is no longer enlarged.

Reproductive: Surgical clips are identified in the region of the
prostate gland. Seminal vesicles are normal in appearance.

Other: There is a small amount of free pelvic fluid. Anterior
abdominal wall is unremarkable.

Musculoskeletal: Degenerative changes are seen at L5-S1. No
suspicious lytic or blastic lesions are identified.
IMPRESSION: 1. Distended gallbladder with surrounding inflammatory changes,
perihepatic fluid, and gallstones, suspicious for acute
cholecystitis.
2. Cardiomegaly.  Sternotomy.  Coronary artery disease.
3. Stable appearance of cystic lesions within the pancreatic body
and tail. Follow-up CT of the abdomen with pancreatic protocol
recommended in 2 years.
4. Normal appendix.
5.  Aortic atherosclerosis.  ([id number])
6. Prostatectomy.

## 2020-03-20 ENCOUNTER — Telehealth (INDEPENDENT_AMBULATORY_CARE_PROVIDER_SITE_OTHER): Payer: Medicare Other | Admitting: Physician Assistant

## 2020-03-20 ENCOUNTER — Encounter: Payer: Self-pay | Admitting: Physician Assistant

## 2020-03-20 DIAGNOSIS — T887XXA Unspecified adverse effect of drug or medicament, initial encounter: Secondary | ICD-10-CM

## 2020-03-20 DIAGNOSIS — R531 Weakness: Secondary | ICD-10-CM

## 2020-03-20 DIAGNOSIS — R413 Other amnesia: Secondary | ICD-10-CM | POA: Diagnosis not present

## 2020-03-20 LAB — HEPATITIS PANEL, ACUTE
HCV Ab: <0.1 s/co ratio — AB (ref 0.0–0.9)
Hep A IgM: NEGATIVE — AB
Hep B C IgM: NEGATIVE — AB
Hepatitis B Surface Ag: NEGATIVE — AB

## 2020-03-20 NOTE — Progress Notes (Signed)
MyChart Video Visit    Virtual Visit via Video Note   This visit type was conducted due to national recommendations for restrictions regarding the COVID-19 Pandemic (e.g. social distancing) in an effort to limit this patient's exposure and mitigate transmission in our community. This patient is at least at moderate risk for complications without adequate follow up. This format is felt to be most appropriate for this patient at this time. Physical exam was limited by quality of the video and audio technology used for the visit.   Interactive audio and video communications were attempted, although failed due to patient's inability to connect to video. Continued visit with audio only interaction with patient agreement.  Patient location: Home Provider location: [facility]  I discussed the limitations of evaluation and management by telemedicine and the availability of in person appointments. The patient expressed understanding and agreed to proceed.  Patient: Charles Cook   DOB: 02-12-1930   86 y.o. Male  MRN: [id number] Visit Date: 03/20/2020  Today's healthcare provider: Mar Daring, PA-C   No chief complaint on file.  Subjective    HPI  ER f/u weakness.  Patient presented to [facility] ER on 03/17/20 and had reported having increased and progressive over the past week. All started after his covid 19 booster. Had decreased intake and progressively became weaker. Family felt he may have been dehydrated and needed fluids. He did have one episode of emesis on the day he went to the ER. His BP was elevated at the hospital. He did have a low sodium and calcium. His urine was concentrated. He had acute elevation of liver enzymes, suspected to be secondary to chronic, long-term use of amiodarone. Troponin had been elevated but was stable. No symptoms of ACS. CXR showed interstitial densities, could be chronic or represent mild edema or atypical infection. CT head was normal. They also preformed a RUQ  [place] due to acutely elevated liver enzymes. [place] was unremarkable. No liver lesions, liver was in normal size parameters. Covid testing was negative.   Patient Active Problem List   Diagnosis Date Noted  . Pain due to onychomycosis of toenails of both feet 06/06/2019  . Coagulation defect ([place]) 06/06/2019  . Chronic idiopathic constipation 01/24/2019  . Elevated glucose 01/24/2019  . Acute post-hemorrhagic anemia   . Osteopenia 03/13/2017  . Sepsis ([place]) 10/07/2015  . Syncope 10/05/2015  . Congestive cardiomyopathy ([place]) 07/09/2015  . Persistent atrial fibrillation ([place]) 07/09/2015  . Gallstone 07/08/2015  . Hx of extrinsic asthma 02/06/2015  . Benign fibroma of prostate 12/16/2014  . Arteriosclerosis of coronary artery 12/16/2014  . Clinical depression 12/16/2014  . Fracture of clavicle 12/16/2014  . H/O: depression 12/16/2014  . H/O coronary artery bypass surgery 12/16/2014  . H/O malignant neoplasm of prostate 12/16/2014  . HLD (hyperlipidemia) 12/16/2014  . BP (high blood pressure) 12/16/2014  . Infection with methicillin-resistant Staphylococcus aureus 12/16/2014  . Diaphragmatic hernia 09/22/2006   Past Medical History:  Diagnosis Date  . Atrial fibrillation ([place])    on eliquis  . Bladder cancer ([place])   . BPH (benign prostatic hyperplasia)   . CAD (coronary artery disease)    s/p CABG in 2006  . Cardiomyopathy ([place])   . Depression    controlled;   . Hiatal hernia   . Hx MRSA infection   . Hyperlipidemia   . Hypertension   . Hyponatremia   . Myocardial infarction ([place])   . Prostate cancer ([place])  Medications: Outpatient Medications Prior to Visit  Medication Sig  . acetaminophen (TYLENOL) 500 MG tablet Take 1,000 mg by mouth daily as needed for moderate pain or headache.  Marland Kitchen amiodarone (PACERONE) 200 MG tablet TAKE ONE TABLET BY MOUTH DAILY  . apixaban (ELIQUIS) 2.5 MG TABS tablet Take 1 tablet (2.5 mg total) by mouth 2 (two) times daily.  Marland Kitchen aspirin EC 81 MG  tablet Take 81 mg by mouth at bedtime.  . Calcium Carbonate-Vitamin D (CALCIUM-D) 600-400 MG-UNIT TABS Take 1 tablet by mouth 2 (two) times daily.   Marland Kitchen denosumab (PROLIA) 60 MG/ML SOSY injection Inject 60 mg into the skin every 6 (six) months. (Patient not taking: Reported on 02/04/2020)  . digoxin (LANOXIN) 0.125 MG tablet Take 1 tablet (125 mcg total) by mouth daily.  Marland Kitchen donepezil (ARICEPT) 5 MG tablet TAKE 1 TABLET BY MOUTH AT BEDTIME  . Leuprolide Acetate, 6 Month, (LUPRON DEPOT, 80-MONTH, IM) Inject into the muscle every 6 (six) months.  . Melatonin 10 MG TABS Take 10 mg by mouth daily.  . Multiple Vitamin (MULTIVITAMIN WITH MINERALS) TABS tablet Take 1 tablet by mouth daily.  . niacin (NIASPAN) 1000 MG CR tablet Take 1,000 mg by mouth daily.  . polyethylene glycol (MIRALAX / GLYCOLAX) packet Take 17 g by mouth daily as needed for mild constipation.   . sertraline (ZOLOFT) 50 MG tablet Take 50 mg by mouth daily.  . traZODone (DESYREL) 100 MG tablet TAKE ONE TABLET BY MOUTH AT BEDTIME  . vitamin B-12 (CYANOCOBALAMIN) 500 MCG tablet Take 500 mcg by mouth daily.   No facility-administered medications prior to visit.    Review of Systems  Constitutional: Positive for fatigue.  HENT: Negative.   Respiratory: Negative.   Cardiovascular: Negative.   Neurological: Negative.     Last CBC Lab Results  Component Value Date   WBC 12.6 (H) 03/17/2020   HGB 13.2 03/17/2020   HCT 39.4 03/17/2020   MCV 98.3 03/17/2020   MCH 32.9 03/17/2020   RDW 12.9 03/17/2020   PLT 314 70/35/0093   Last metabolic panel Lab Results  Component Value Date   GLUCOSE 130 (H) 03/17/2020   NA 130 (L) 03/17/2020   K 4.2 03/17/2020   CL 93 (L) 03/17/2020   CO2 28 03/17/2020   BUN 13 03/17/2020   CREATININE 0.96 03/17/2020   GFRNONAA >60 03/17/2020   GFRAA 84 02/19/2020   CALCIUM 8.5 (L) 03/17/2020   PHOS 3.0 01/27/2018   PROT 6.5 03/17/2020   ALBUMIN 3.5 03/17/2020   LABGLOB 2.1 02/19/2020   AGRATIO  2.0 02/19/2020   BILITOT 0.9 03/17/2020   ALKPHOS 110 03/17/2020   AST 237 (H) 03/17/2020   ALT 186 (H) 03/17/2020   ANIONGAP 9 03/17/2020   Last lipids Lab Results  Component Value Date   CHOL 233 (H) 02/19/2020   HDL 76 02/19/2020   LDLCALC 140 (H) 02/19/2020   TRIG 97 02/19/2020   CHOLHDL 2.8 01/25/2019      Objective    There were no vitals taken for this visit. BP Readings from Last 3 Encounters:  03/17/20 (!) 186/56  02/19/20 (!) 166/77  12/20/19 (!) 150/62   Wt Readings from Last 3 Encounters:  03/17/20 150 lb (68 kg)  02/19/20 156 lb (70.8 kg)  12/20/19 156 lb 8 oz (71 kg)      Physical Exam     Assessment & Plan     1. Weakness Daughter, Benjamine Mola, reports Timmothy Sours is doing better and appears back  to baseline. He went out for lunch and had a good lunch, but had to come home afterwards to rest. Timmothy Sours agrees he feels he is doing much better than when he went to the ER. Continue to push fluids. Also feel that Donepezil may be contributing to the weakness. Will stop Donepezil and f/u in 3-4 weeks. Needs labs rechecked.   2. Memory loss Stop Donepezil. Hopefully symptoms noted above continue to improve.   3. Medication side effect Possible weakness from Donepezil. Stopping to see if symptoms improve. Call if not improving.    No follow-ups on file.     I discussed the assessment and treatment plan with the patient. The patient was provided an opportunity to ask questions and all were answered. The patient agreed with the plan and demonstrated an understanding of the instructions.   The patient was advised to call back or seek an in-person evaluation if the symptoms worsen or if the condition fails to improve as anticipated.  I provided 17 minutes of non-face-to-face time during this encounter.  Reynolds Bowl, PA-C, have reviewed all documentation for this visit. The documentation on 03/25/20 for the exam, diagnosis, procedures, and orders are all  accurate and complete.  [facility] [facility] [phone] (phone) [phone] (fax)  [place]

## 2020-03-25 ENCOUNTER — Encounter: Payer: Self-pay | Admitting: Physician Assistant

## 2020-03-30 ENCOUNTER — Ambulatory Visit (INDEPENDENT_AMBULATORY_CARE_PROVIDER_SITE_OTHER): Payer: Medicare Other | Admitting: Podiatry

## 2020-03-30 ENCOUNTER — Encounter: Payer: Self-pay | Admitting: Podiatry

## 2020-03-30 ENCOUNTER — Other Ambulatory Visit: Payer: Self-pay

## 2020-03-30 DIAGNOSIS — B351 Tinea unguium: Secondary | ICD-10-CM

## 2020-03-30 DIAGNOSIS — D689 Coagulation defect, unspecified: Secondary | ICD-10-CM | POA: Diagnosis not present

## 2020-03-30 DIAGNOSIS — M79675 Pain in left toe(s): Secondary | ICD-10-CM

## 2020-03-30 DIAGNOSIS — M79674 Pain in right toe(s): Secondary | ICD-10-CM

## 2020-03-30 NOTE — Progress Notes (Addendum)
This patient returns to my office for at risk foot care.  This patient requires this care by a professional since this patient will be at risk due to having coagulation defect due to Eliquis.  This patient is unable to cut nails himself since the patient cannot reach his nails.These nails are painful walking and wearing shoes.   This patient presents to the office with a caregiver. This patient presents for at risk foot care today.  General Appearance  Alert, conversant and in no acute stress.  Vascular  Dorsalis pedis pulses are palpable  Bilaterally.Posterior tibial pulses are weakly palpable  B/L.  Capillary return is within normal limits  bilaterally. Temperature is within normal limits  bilaterally.  Neurologic  Senn-Weinstein monofilament wire test within normal limits  bilaterally. Muscle power within normal limits bilaterally.  Nails Thick disfigured discolored nails with subungual debris  from hallux to fifth toes bilaterally. No evidence of bacterial infection or drainage bilaterally.  Orthopedic  No limitations of motion  feet .  No crepitus or effusions noted.  No bony pathology or digital deformities noted.  HAV  B/L. Hammer toes 2-5  B/L.  Skin  normotropic skin with no porokeratosis noted bilaterally.  No signs of infections or ulcers noted.     Onychomycosis  Pain in right toes  Pain in left toes  Consent was obtained for treatment procedures.   Mechanical debridement of nails 1-5  bilaterally performed with a nail nipper.  Filed with dremel without incident.    Return office visit   3 months                 Told patient to return for periodic foot care and evaluation due to potential at risk complications.   Gardiner Barefoot DPM

## 2020-04-10 ENCOUNTER — Encounter: Payer: Self-pay | Admitting: Physician Assistant

## 2020-04-10 DIAGNOSIS — R630 Anorexia: Secondary | ICD-10-CM

## 2020-04-10 DIAGNOSIS — G309 Alzheimer's disease, unspecified: Secondary | ICD-10-CM | POA: Diagnosis not present

## 2020-04-10 DIAGNOSIS — R2689 Other abnormalities of gait and mobility: Secondary | ICD-10-CM | POA: Diagnosis not present

## 2020-04-10 DIAGNOSIS — E538 Deficiency of other specified B group vitamins: Secondary | ICD-10-CM | POA: Diagnosis not present

## 2020-04-10 DIAGNOSIS — F0280 Dementia in other diseases classified elsewhere without behavioral disturbance: Secondary | ICD-10-CM | POA: Diagnosis not present

## 2020-04-10 DIAGNOSIS — R634 Abnormal weight loss: Secondary | ICD-10-CM

## 2020-04-10 DIAGNOSIS — E519 Thiamine deficiency, unspecified: Secondary | ICD-10-CM | POA: Diagnosis not present

## 2020-04-10 DIAGNOSIS — F0150 Vascular dementia without behavioral disturbance: Secondary | ICD-10-CM | POA: Diagnosis not present

## 2020-04-10 MED ORDER — MIRTAZAPINE 7.5 MG PO TABS: 7.5000 mg | ORAL_TABLET | Freq: Every day | ORAL | 1 refills | Status: DC

## 2020-04-12 DIAGNOSIS — G309 Alzheimer's disease, unspecified: Secondary | ICD-10-CM | POA: Diagnosis not present

## 2020-04-12 DIAGNOSIS — Z951 Presence of aortocoronary bypass graft: Secondary | ICD-10-CM | POA: Diagnosis not present

## 2020-04-12 DIAGNOSIS — G4700 Insomnia, unspecified: Secondary | ICD-10-CM | POA: Diagnosis not present

## 2020-04-12 DIAGNOSIS — F32A Depression, unspecified: Secondary | ICD-10-CM | POA: Diagnosis not present

## 2020-04-12 DIAGNOSIS — R251 Tremor, unspecified: Secondary | ICD-10-CM | POA: Diagnosis not present

## 2020-04-12 DIAGNOSIS — Z8546 Personal history of malignant neoplasm of prostate: Secondary | ICD-10-CM | POA: Diagnosis not present

## 2020-04-12 DIAGNOSIS — E538 Deficiency of other specified B group vitamins: Secondary | ICD-10-CM | POA: Diagnosis not present

## 2020-04-12 DIAGNOSIS — F0150 Vascular dementia without behavioral disturbance: Secondary | ICD-10-CM | POA: Diagnosis not present

## 2020-04-12 DIAGNOSIS — Z7901 Long term (current) use of anticoagulants: Secondary | ICD-10-CM | POA: Diagnosis not present

## 2020-04-12 DIAGNOSIS — K449 Diaphragmatic hernia without obstruction or gangrene: Secondary | ICD-10-CM | POA: Diagnosis not present

## 2020-04-12 DIAGNOSIS — I25810 Atherosclerosis of coronary artery bypass graft(s) without angina pectoris: Secondary | ICD-10-CM | POA: Diagnosis not present

## 2020-04-12 DIAGNOSIS — I129 Hypertensive chronic kidney disease with stage 1 through stage 4 chronic kidney disease, or unspecified chronic kidney disease: Secondary | ICD-10-CM | POA: Diagnosis not present

## 2020-04-12 DIAGNOSIS — E559 Vitamin D deficiency, unspecified: Secondary | ICD-10-CM | POA: Diagnosis not present

## 2020-04-12 DIAGNOSIS — G3189 Other specified degenerative diseases of nervous system: Secondary | ICD-10-CM | POA: Diagnosis not present

## 2020-04-12 DIAGNOSIS — F0280 Dementia in other diseases classified elsewhere without behavioral disturbance: Secondary | ICD-10-CM | POA: Diagnosis not present

## 2020-04-12 DIAGNOSIS — N400 Enlarged prostate without lower urinary tract symptoms: Secondary | ICD-10-CM | POA: Diagnosis not present

## 2020-04-12 DIAGNOSIS — I429 Cardiomyopathy, unspecified: Secondary | ICD-10-CM | POA: Diagnosis not present

## 2020-04-12 DIAGNOSIS — Z7982 Long term (current) use of aspirin: Secondary | ICD-10-CM | POA: Diagnosis not present

## 2020-04-12 DIAGNOSIS — E782 Mixed hyperlipidemia: Secondary | ICD-10-CM | POA: Diagnosis not present

## 2020-04-12 DIAGNOSIS — I4891 Unspecified atrial fibrillation: Secondary | ICD-10-CM | POA: Diagnosis not present

## 2020-04-12 DIAGNOSIS — N189 Chronic kidney disease, unspecified: Secondary | ICD-10-CM | POA: Diagnosis not present

## 2020-04-15 DIAGNOSIS — F0280 Dementia in other diseases classified elsewhere without behavioral disturbance: Secondary | ICD-10-CM | POA: Diagnosis not present

## 2020-04-15 DIAGNOSIS — I4891 Unspecified atrial fibrillation: Secondary | ICD-10-CM | POA: Diagnosis not present

## 2020-04-15 DIAGNOSIS — G309 Alzheimer's disease, unspecified: Secondary | ICD-10-CM | POA: Diagnosis not present

## 2020-04-15 DIAGNOSIS — F0150 Vascular dementia without behavioral disturbance: Secondary | ICD-10-CM | POA: Diagnosis not present

## 2020-04-15 DIAGNOSIS — I129 Hypertensive chronic kidney disease with stage 1 through stage 4 chronic kidney disease, or unspecified chronic kidney disease: Secondary | ICD-10-CM | POA: Diagnosis not present

## 2020-04-15 DIAGNOSIS — N189 Chronic kidney disease, unspecified: Secondary | ICD-10-CM | POA: Diagnosis not present

## 2020-04-17 DIAGNOSIS — I420 Dilated cardiomyopathy: Secondary | ICD-10-CM | POA: Diagnosis not present

## 2020-04-21 DIAGNOSIS — I4891 Unspecified atrial fibrillation: Secondary | ICD-10-CM | POA: Diagnosis not present

## 2020-04-21 DIAGNOSIS — G309 Alzheimer's disease, unspecified: Secondary | ICD-10-CM | POA: Diagnosis not present

## 2020-04-21 DIAGNOSIS — I129 Hypertensive chronic kidney disease with stage 1 through stage 4 chronic kidney disease, or unspecified chronic kidney disease: Secondary | ICD-10-CM | POA: Diagnosis not present

## 2020-04-21 DIAGNOSIS — N189 Chronic kidney disease, unspecified: Secondary | ICD-10-CM | POA: Diagnosis not present

## 2020-04-21 DIAGNOSIS — F0150 Vascular dementia without behavioral disturbance: Secondary | ICD-10-CM | POA: Diagnosis not present

## 2020-04-21 DIAGNOSIS — F0280 Dementia in other diseases classified elsewhere without behavioral disturbance: Secondary | ICD-10-CM | POA: Diagnosis not present

## 2020-04-27 ENCOUNTER — Other Ambulatory Visit: Payer: Self-pay | Admitting: Family Medicine

## 2020-04-27 DIAGNOSIS — F5101 Primary insomnia: Secondary | ICD-10-CM

## 2020-04-28 DIAGNOSIS — I129 Hypertensive chronic kidney disease with stage 1 through stage 4 chronic kidney disease, or unspecified chronic kidney disease: Secondary | ICD-10-CM | POA: Diagnosis not present

## 2020-04-28 DIAGNOSIS — F0280 Dementia in other diseases classified elsewhere without behavioral disturbance: Secondary | ICD-10-CM | POA: Diagnosis not present

## 2020-04-28 DIAGNOSIS — G309 Alzheimer's disease, unspecified: Secondary | ICD-10-CM | POA: Diagnosis not present

## 2020-04-28 DIAGNOSIS — I4891 Unspecified atrial fibrillation: Secondary | ICD-10-CM | POA: Diagnosis not present

## 2020-04-28 DIAGNOSIS — N189 Chronic kidney disease, unspecified: Secondary | ICD-10-CM | POA: Diagnosis not present

## 2020-04-28 DIAGNOSIS — F0150 Vascular dementia without behavioral disturbance: Secondary | ICD-10-CM | POA: Diagnosis not present

## 2020-04-30 DIAGNOSIS — I4891 Unspecified atrial fibrillation: Secondary | ICD-10-CM | POA: Diagnosis not present

## 2020-04-30 DIAGNOSIS — N189 Chronic kidney disease, unspecified: Secondary | ICD-10-CM | POA: Diagnosis not present

## 2020-04-30 DIAGNOSIS — F0280 Dementia in other diseases classified elsewhere without behavioral disturbance: Secondary | ICD-10-CM | POA: Diagnosis not present

## 2020-04-30 DIAGNOSIS — G309 Alzheimer's disease, unspecified: Secondary | ICD-10-CM | POA: Diagnosis not present

## 2020-04-30 DIAGNOSIS — F0150 Vascular dementia without behavioral disturbance: Secondary | ICD-10-CM | POA: Diagnosis not present

## 2020-04-30 DIAGNOSIS — I129 Hypertensive chronic kidney disease with stage 1 through stage 4 chronic kidney disease, or unspecified chronic kidney disease: Secondary | ICD-10-CM | POA: Diagnosis not present

## 2020-05-04 DIAGNOSIS — F0280 Dementia in other diseases classified elsewhere without behavioral disturbance: Secondary | ICD-10-CM | POA: Diagnosis not present

## 2020-05-04 DIAGNOSIS — G3189 Other specified degenerative diseases of nervous system: Secondary | ICD-10-CM | POA: Diagnosis not present

## 2020-05-04 DIAGNOSIS — N189 Chronic kidney disease, unspecified: Secondary | ICD-10-CM | POA: Diagnosis not present

## 2020-05-04 DIAGNOSIS — F0150 Vascular dementia without behavioral disturbance: Secondary | ICD-10-CM | POA: Diagnosis not present

## 2020-05-04 DIAGNOSIS — G309 Alzheimer's disease, unspecified: Secondary | ICD-10-CM | POA: Diagnosis not present

## 2020-05-04 DIAGNOSIS — I4891 Unspecified atrial fibrillation: Secondary | ICD-10-CM | POA: Diagnosis not present

## 2020-05-04 DIAGNOSIS — I129 Hypertensive chronic kidney disease with stage 1 through stage 4 chronic kidney disease, or unspecified chronic kidney disease: Secondary | ICD-10-CM | POA: Diagnosis not present

## 2020-05-05 DIAGNOSIS — F0280 Dementia in other diseases classified elsewhere without behavioral disturbance: Secondary | ICD-10-CM | POA: Diagnosis not present

## 2020-05-05 DIAGNOSIS — G309 Alzheimer's disease, unspecified: Secondary | ICD-10-CM | POA: Diagnosis not present

## 2020-05-05 DIAGNOSIS — F0150 Vascular dementia without behavioral disturbance: Secondary | ICD-10-CM | POA: Diagnosis not present

## 2020-05-05 DIAGNOSIS — I129 Hypertensive chronic kidney disease with stage 1 through stage 4 chronic kidney disease, or unspecified chronic kidney disease: Secondary | ICD-10-CM | POA: Diagnosis not present

## 2020-05-05 DIAGNOSIS — I4891 Unspecified atrial fibrillation: Secondary | ICD-10-CM | POA: Diagnosis not present

## 2020-05-05 DIAGNOSIS — N189 Chronic kidney disease, unspecified: Secondary | ICD-10-CM | POA: Diagnosis not present

## 2020-05-07 DIAGNOSIS — F0280 Dementia in other diseases classified elsewhere without behavioral disturbance: Secondary | ICD-10-CM | POA: Diagnosis not present

## 2020-05-07 DIAGNOSIS — G309 Alzheimer's disease, unspecified: Secondary | ICD-10-CM | POA: Diagnosis not present

## 2020-05-07 DIAGNOSIS — I129 Hypertensive chronic kidney disease with stage 1 through stage 4 chronic kidney disease, or unspecified chronic kidney disease: Secondary | ICD-10-CM | POA: Diagnosis not present

## 2020-05-07 DIAGNOSIS — I4891 Unspecified atrial fibrillation: Secondary | ICD-10-CM | POA: Diagnosis not present

## 2020-05-07 DIAGNOSIS — F0150 Vascular dementia without behavioral disturbance: Secondary | ICD-10-CM | POA: Diagnosis not present

## 2020-05-07 DIAGNOSIS — N189 Chronic kidney disease, unspecified: Secondary | ICD-10-CM | POA: Diagnosis not present

## 2020-05-11 DIAGNOSIS — I129 Hypertensive chronic kidney disease with stage 1 through stage 4 chronic kidney disease, or unspecified chronic kidney disease: Secondary | ICD-10-CM | POA: Diagnosis not present

## 2020-05-11 DIAGNOSIS — F0150 Vascular dementia without behavioral disturbance: Secondary | ICD-10-CM | POA: Diagnosis not present

## 2020-05-11 DIAGNOSIS — F0280 Dementia in other diseases classified elsewhere without behavioral disturbance: Secondary | ICD-10-CM | POA: Diagnosis not present

## 2020-05-11 DIAGNOSIS — I4891 Unspecified atrial fibrillation: Secondary | ICD-10-CM | POA: Diagnosis not present

## 2020-05-11 DIAGNOSIS — G309 Alzheimer's disease, unspecified: Secondary | ICD-10-CM | POA: Diagnosis not present

## 2020-05-11 DIAGNOSIS — N189 Chronic kidney disease, unspecified: Secondary | ICD-10-CM | POA: Diagnosis not present

## 2020-05-12 DIAGNOSIS — G3189 Other specified degenerative diseases of nervous system: Secondary | ICD-10-CM | POA: Diagnosis not present

## 2020-05-12 DIAGNOSIS — E559 Vitamin D deficiency, unspecified: Secondary | ICD-10-CM | POA: Diagnosis not present

## 2020-05-12 DIAGNOSIS — E538 Deficiency of other specified B group vitamins: Secondary | ICD-10-CM | POA: Diagnosis not present

## 2020-05-12 DIAGNOSIS — N400 Enlarged prostate without lower urinary tract symptoms: Secondary | ICD-10-CM | POA: Diagnosis not present

## 2020-05-12 DIAGNOSIS — Z7901 Long term (current) use of anticoagulants: Secondary | ICD-10-CM | POA: Diagnosis not present

## 2020-05-12 DIAGNOSIS — Z951 Presence of aortocoronary bypass graft: Secondary | ICD-10-CM | POA: Diagnosis not present

## 2020-05-12 DIAGNOSIS — G309 Alzheimer's disease, unspecified: Secondary | ICD-10-CM | POA: Diagnosis not present

## 2020-05-12 DIAGNOSIS — R251 Tremor, unspecified: Secondary | ICD-10-CM | POA: Diagnosis not present

## 2020-05-12 DIAGNOSIS — F32A Depression, unspecified: Secondary | ICD-10-CM | POA: Diagnosis not present

## 2020-05-12 DIAGNOSIS — E782 Mixed hyperlipidemia: Secondary | ICD-10-CM | POA: Diagnosis not present

## 2020-05-12 DIAGNOSIS — I25810 Atherosclerosis of coronary artery bypass graft(s) without angina pectoris: Secondary | ICD-10-CM | POA: Diagnosis not present

## 2020-05-12 DIAGNOSIS — I129 Hypertensive chronic kidney disease with stage 1 through stage 4 chronic kidney disease, or unspecified chronic kidney disease: Secondary | ICD-10-CM | POA: Diagnosis not present

## 2020-05-12 DIAGNOSIS — Z8546 Personal history of malignant neoplasm of prostate: Secondary | ICD-10-CM | POA: Diagnosis not present

## 2020-05-12 DIAGNOSIS — F0280 Dementia in other diseases classified elsewhere without behavioral disturbance: Secondary | ICD-10-CM | POA: Diagnosis not present

## 2020-05-12 DIAGNOSIS — N189 Chronic kidney disease, unspecified: Secondary | ICD-10-CM | POA: Diagnosis not present

## 2020-05-12 DIAGNOSIS — Z7982 Long term (current) use of aspirin: Secondary | ICD-10-CM | POA: Diagnosis not present

## 2020-05-12 DIAGNOSIS — I429 Cardiomyopathy, unspecified: Secondary | ICD-10-CM | POA: Diagnosis not present

## 2020-05-12 DIAGNOSIS — I4891 Unspecified atrial fibrillation: Secondary | ICD-10-CM | POA: Diagnosis not present

## 2020-05-12 DIAGNOSIS — G4700 Insomnia, unspecified: Secondary | ICD-10-CM | POA: Diagnosis not present

## 2020-05-12 DIAGNOSIS — K449 Diaphragmatic hernia without obstruction or gangrene: Secondary | ICD-10-CM | POA: Diagnosis not present

## 2020-05-12 DIAGNOSIS — F0150 Vascular dementia without behavioral disturbance: Secondary | ICD-10-CM | POA: Diagnosis not present

## 2020-05-14 DIAGNOSIS — I129 Hypertensive chronic kidney disease with stage 1 through stage 4 chronic kidney disease, or unspecified chronic kidney disease: Secondary | ICD-10-CM | POA: Diagnosis not present

## 2020-05-14 DIAGNOSIS — F0280 Dementia in other diseases classified elsewhere without behavioral disturbance: Secondary | ICD-10-CM | POA: Diagnosis not present

## 2020-05-14 DIAGNOSIS — G309 Alzheimer's disease, unspecified: Secondary | ICD-10-CM | POA: Diagnosis not present

## 2020-05-14 DIAGNOSIS — N189 Chronic kidney disease, unspecified: Secondary | ICD-10-CM | POA: Diagnosis not present

## 2020-05-14 DIAGNOSIS — F0150 Vascular dementia without behavioral disturbance: Secondary | ICD-10-CM | POA: Diagnosis not present

## 2020-05-14 DIAGNOSIS — I4891 Unspecified atrial fibrillation: Secondary | ICD-10-CM | POA: Diagnosis not present

## 2020-05-19 DIAGNOSIS — G309 Alzheimer's disease, unspecified: Secondary | ICD-10-CM | POA: Diagnosis not present

## 2020-05-19 DIAGNOSIS — I4891 Unspecified atrial fibrillation: Secondary | ICD-10-CM | POA: Diagnosis not present

## 2020-05-19 DIAGNOSIS — F0150 Vascular dementia without behavioral disturbance: Secondary | ICD-10-CM | POA: Diagnosis not present

## 2020-05-19 DIAGNOSIS — N189 Chronic kidney disease, unspecified: Secondary | ICD-10-CM | POA: Diagnosis not present

## 2020-05-19 DIAGNOSIS — F0280 Dementia in other diseases classified elsewhere without behavioral disturbance: Secondary | ICD-10-CM | POA: Diagnosis not present

## 2020-05-19 DIAGNOSIS — I129 Hypertensive chronic kidney disease with stage 1 through stage 4 chronic kidney disease, or unspecified chronic kidney disease: Secondary | ICD-10-CM | POA: Diagnosis not present

## 2020-05-20 DIAGNOSIS — I129 Hypertensive chronic kidney disease with stage 1 through stage 4 chronic kidney disease, or unspecified chronic kidney disease: Secondary | ICD-10-CM | POA: Diagnosis not present

## 2020-05-20 DIAGNOSIS — F0150 Vascular dementia without behavioral disturbance: Secondary | ICD-10-CM | POA: Diagnosis not present

## 2020-05-20 DIAGNOSIS — N189 Chronic kidney disease, unspecified: Secondary | ICD-10-CM | POA: Diagnosis not present

## 2020-05-20 DIAGNOSIS — I4891 Unspecified atrial fibrillation: Secondary | ICD-10-CM | POA: Diagnosis not present

## 2020-05-20 DIAGNOSIS — F0280 Dementia in other diseases classified elsewhere without behavioral disturbance: Secondary | ICD-10-CM | POA: Diagnosis not present

## 2020-05-20 DIAGNOSIS — G309 Alzheimer's disease, unspecified: Secondary | ICD-10-CM | POA: Diagnosis not present

## 2020-05-25 DIAGNOSIS — N189 Chronic kidney disease, unspecified: Secondary | ICD-10-CM | POA: Diagnosis not present

## 2020-05-25 DIAGNOSIS — F0150 Vascular dementia without behavioral disturbance: Secondary | ICD-10-CM | POA: Diagnosis not present

## 2020-05-25 DIAGNOSIS — I4891 Unspecified atrial fibrillation: Secondary | ICD-10-CM | POA: Diagnosis not present

## 2020-05-25 DIAGNOSIS — G309 Alzheimer's disease, unspecified: Secondary | ICD-10-CM | POA: Diagnosis not present

## 2020-05-25 DIAGNOSIS — I129 Hypertensive chronic kidney disease with stage 1 through stage 4 chronic kidney disease, or unspecified chronic kidney disease: Secondary | ICD-10-CM | POA: Diagnosis not present

## 2020-05-25 DIAGNOSIS — F0280 Dementia in other diseases classified elsewhere without behavioral disturbance: Secondary | ICD-10-CM | POA: Diagnosis not present

## 2020-05-27 ENCOUNTER — Other Ambulatory Visit: Payer: Self-pay

## 2020-05-27 DIAGNOSIS — F0150 Vascular dementia without behavioral disturbance: Secondary | ICD-10-CM | POA: Diagnosis not present

## 2020-05-27 DIAGNOSIS — N189 Chronic kidney disease, unspecified: Secondary | ICD-10-CM | POA: Diagnosis not present

## 2020-05-27 DIAGNOSIS — F0280 Dementia in other diseases classified elsewhere without behavioral disturbance: Secondary | ICD-10-CM | POA: Diagnosis not present

## 2020-05-27 DIAGNOSIS — F5101 Primary insomnia: Secondary | ICD-10-CM

## 2020-05-27 DIAGNOSIS — I4891 Unspecified atrial fibrillation: Secondary | ICD-10-CM | POA: Diagnosis not present

## 2020-05-27 DIAGNOSIS — I129 Hypertensive chronic kidney disease with stage 1 through stage 4 chronic kidney disease, or unspecified chronic kidney disease: Secondary | ICD-10-CM | POA: Diagnosis not present

## 2020-05-27 DIAGNOSIS — G309 Alzheimer's disease, unspecified: Secondary | ICD-10-CM | POA: Diagnosis not present

## 2020-05-27 MED ORDER — TRAZODONE HCL 100 MG PO TABS: 100.0000 mg | ORAL_TABLET | Freq: Every day | ORAL | 1 refills | Status: AC

## 2020-05-27 NOTE — Telephone Encounter (Signed)
[place] faxed refill request for the following medications:  traZODone (DESYREL) 100 MG tablet  Please advise. Thanks, [employer]

## 2020-06-01 DIAGNOSIS — F0150 Vascular dementia without behavioral disturbance: Secondary | ICD-10-CM | POA: Diagnosis not present

## 2020-06-01 DIAGNOSIS — G309 Alzheimer's disease, unspecified: Secondary | ICD-10-CM | POA: Diagnosis not present

## 2020-06-01 DIAGNOSIS — I129 Hypertensive chronic kidney disease with stage 1 through stage 4 chronic kidney disease, or unspecified chronic kidney disease: Secondary | ICD-10-CM | POA: Diagnosis not present

## 2020-06-01 DIAGNOSIS — N189 Chronic kidney disease, unspecified: Secondary | ICD-10-CM | POA: Diagnosis not present

## 2020-06-01 DIAGNOSIS — I4891 Unspecified atrial fibrillation: Secondary | ICD-10-CM | POA: Diagnosis not present

## 2020-06-01 DIAGNOSIS — F0280 Dementia in other diseases classified elsewhere without behavioral disturbance: Secondary | ICD-10-CM | POA: Diagnosis not present

## 2020-06-02 DIAGNOSIS — N189 Chronic kidney disease, unspecified: Secondary | ICD-10-CM | POA: Diagnosis not present

## 2020-06-02 DIAGNOSIS — F0280 Dementia in other diseases classified elsewhere without behavioral disturbance: Secondary | ICD-10-CM | POA: Diagnosis not present

## 2020-06-02 DIAGNOSIS — G309 Alzheimer's disease, unspecified: Secondary | ICD-10-CM | POA: Diagnosis not present

## 2020-06-02 DIAGNOSIS — I4891 Unspecified atrial fibrillation: Secondary | ICD-10-CM | POA: Diagnosis not present

## 2020-06-02 DIAGNOSIS — F0150 Vascular dementia without behavioral disturbance: Secondary | ICD-10-CM | POA: Diagnosis not present

## 2020-06-02 DIAGNOSIS — I129 Hypertensive chronic kidney disease with stage 1 through stage 4 chronic kidney disease, or unspecified chronic kidney disease: Secondary | ICD-10-CM | POA: Diagnosis not present

## 2020-06-08 DIAGNOSIS — I129 Hypertensive chronic kidney disease with stage 1 through stage 4 chronic kidney disease, or unspecified chronic kidney disease: Secondary | ICD-10-CM | POA: Diagnosis not present

## 2020-06-08 DIAGNOSIS — F0280 Dementia in other diseases classified elsewhere without behavioral disturbance: Secondary | ICD-10-CM | POA: Diagnosis not present

## 2020-06-08 DIAGNOSIS — I4891 Unspecified atrial fibrillation: Secondary | ICD-10-CM | POA: Diagnosis not present

## 2020-06-08 DIAGNOSIS — G309 Alzheimer's disease, unspecified: Secondary | ICD-10-CM | POA: Diagnosis not present

## 2020-06-08 DIAGNOSIS — F0150 Vascular dementia without behavioral disturbance: Secondary | ICD-10-CM | POA: Diagnosis not present

## 2020-06-08 DIAGNOSIS — N189 Chronic kidney disease, unspecified: Secondary | ICD-10-CM | POA: Diagnosis not present

## 2020-06-10 ENCOUNTER — Encounter: Payer: Self-pay | Admitting: Physician Assistant

## 2020-06-10 DIAGNOSIS — M542 Cervicalgia: Secondary | ICD-10-CM

## 2020-06-11 ENCOUNTER — Telehealth: Payer: Self-pay | Admitting: Physician Assistant

## 2020-06-11 DIAGNOSIS — Z8781 Personal history of (healed) traumatic fracture: Secondary | ICD-10-CM | POA: Diagnosis not present

## 2020-06-11 DIAGNOSIS — R441 Visual hallucinations: Secondary | ICD-10-CM | POA: Diagnosis not present

## 2020-06-11 DIAGNOSIS — E538 Deficiency of other specified B group vitamins: Secondary | ICD-10-CM | POA: Diagnosis not present

## 2020-06-11 DIAGNOSIS — E559 Vitamin D deficiency, unspecified: Secondary | ICD-10-CM | POA: Diagnosis not present

## 2020-06-11 DIAGNOSIS — G4709 Other insomnia: Secondary | ICD-10-CM | POA: Diagnosis not present

## 2020-06-11 DIAGNOSIS — K449 Diaphragmatic hernia without obstruction or gangrene: Secondary | ICD-10-CM | POA: Diagnosis not present

## 2020-06-11 DIAGNOSIS — M19041 Primary osteoarthritis, right hand: Secondary | ICD-10-CM | POA: Diagnosis not present

## 2020-06-11 DIAGNOSIS — Z8719 Personal history of other diseases of the digestive system: Secondary | ICD-10-CM | POA: Diagnosis not present

## 2020-06-11 DIAGNOSIS — Z951 Presence of aortocoronary bypass graft: Secondary | ICD-10-CM | POA: Diagnosis not present

## 2020-06-11 DIAGNOSIS — N400 Enlarged prostate without lower urinary tract symptoms: Secondary | ICD-10-CM | POA: Diagnosis not present

## 2020-06-11 DIAGNOSIS — I4819 Other persistent atrial fibrillation: Secondary | ICD-10-CM | POA: Diagnosis not present

## 2020-06-11 DIAGNOSIS — Z923 Personal history of irradiation: Secondary | ICD-10-CM | POA: Diagnosis not present

## 2020-06-11 DIAGNOSIS — G3189 Other specified degenerative diseases of nervous system: Secondary | ICD-10-CM | POA: Diagnosis not present

## 2020-06-11 DIAGNOSIS — G309 Alzheimer's disease, unspecified: Secondary | ICD-10-CM | POA: Diagnosis not present

## 2020-06-11 DIAGNOSIS — E782 Mixed hyperlipidemia: Secondary | ICD-10-CM | POA: Diagnosis not present

## 2020-06-11 DIAGNOSIS — I420 Dilated cardiomyopathy: Secondary | ICD-10-CM | POA: Diagnosis not present

## 2020-06-11 DIAGNOSIS — R440 Auditory hallucinations: Secondary | ICD-10-CM | POA: Diagnosis not present

## 2020-06-11 DIAGNOSIS — I25810 Atherosclerosis of coronary artery bypass graft(s) without angina pectoris: Secondary | ICD-10-CM | POA: Diagnosis not present

## 2020-06-11 DIAGNOSIS — Z8546 Personal history of malignant neoplasm of prostate: Secondary | ICD-10-CM | POA: Diagnosis not present

## 2020-06-11 DIAGNOSIS — F0150 Vascular dementia without behavioral disturbance: Secondary | ICD-10-CM | POA: Diagnosis not present

## 2020-06-11 DIAGNOSIS — R251 Tremor, unspecified: Secondary | ICD-10-CM | POA: Diagnosis not present

## 2020-06-11 DIAGNOSIS — N189 Chronic kidney disease, unspecified: Secondary | ICD-10-CM | POA: Diagnosis not present

## 2020-06-11 DIAGNOSIS — I129 Hypertensive chronic kidney disease with stage 1 through stage 4 chronic kidney disease, or unspecified chronic kidney disease: Secondary | ICD-10-CM | POA: Diagnosis not present

## 2020-06-11 DIAGNOSIS — F0280 Dementia in other diseases classified elsewhere without behavioral disturbance: Secondary | ICD-10-CM | POA: Diagnosis not present

## 2020-06-11 DIAGNOSIS — F32A Depression, unspecified: Secondary | ICD-10-CM | POA: Diagnosis not present

## 2020-06-11 MED ORDER — TRAMADOL HCL 50 MG PO TABS: 50.0000 mg | ORAL_TABLET | Freq: Four times a day (QID) | ORAL | 0 refills | Status: DC | PRN

## 2020-06-11 NOTE — Telephone Encounter (Signed)
It is acute on chronic neck pain and shoulder pain

## 2020-06-11 NOTE — Telephone Encounter (Signed)
Advised 

## 2020-06-11 NOTE — Telephone Encounter (Signed)
Mark, from pharmacy, calling and is needing to have clarification on the tramadol. He states that he is needing to know which kind of pain the pot is using it for. Please advise.      [place] [address] [place] [address] [place] [address] [address]  Phone: [phone] Fax: [phone]  Hours: Not open 24 hours

## 2020-06-12 ENCOUNTER — Ambulatory Visit: Payer: Medicare Other | Admitting: Physician Assistant

## 2020-06-13 ENCOUNTER — Other Ambulatory Visit: Payer: Self-pay | Admitting: Physician Assistant

## 2020-06-13 DIAGNOSIS — R634 Abnormal weight loss: Secondary | ICD-10-CM

## 2020-06-13 DIAGNOSIS — R630 Anorexia: Secondary | ICD-10-CM

## 2020-06-15 MED ORDER — MIRTAZAPINE 7.5 MG PO TABS: 7.5000 mg | ORAL_TABLET | Freq: Every day | ORAL | 5 refills | Status: AC

## 2020-06-16 DIAGNOSIS — G309 Alzheimer's disease, unspecified: Secondary | ICD-10-CM | POA: Diagnosis not present

## 2020-06-16 DIAGNOSIS — I4819 Other persistent atrial fibrillation: Secondary | ICD-10-CM | POA: Diagnosis not present

## 2020-06-16 DIAGNOSIS — F0280 Dementia in other diseases classified elsewhere without behavioral disturbance: Secondary | ICD-10-CM | POA: Diagnosis not present

## 2020-06-16 DIAGNOSIS — N189 Chronic kidney disease, unspecified: Secondary | ICD-10-CM | POA: Diagnosis not present

## 2020-06-16 DIAGNOSIS — F0150 Vascular dementia without behavioral disturbance: Secondary | ICD-10-CM | POA: Diagnosis not present

## 2020-06-16 DIAGNOSIS — I129 Hypertensive chronic kidney disease with stage 1 through stage 4 chronic kidney disease, or unspecified chronic kidney disease: Secondary | ICD-10-CM | POA: Diagnosis not present

## 2020-06-18 DIAGNOSIS — F0150 Vascular dementia without behavioral disturbance: Secondary | ICD-10-CM | POA: Diagnosis not present

## 2020-06-18 DIAGNOSIS — N189 Chronic kidney disease, unspecified: Secondary | ICD-10-CM | POA: Diagnosis not present

## 2020-06-18 DIAGNOSIS — G309 Alzheimer's disease, unspecified: Secondary | ICD-10-CM | POA: Diagnosis not present

## 2020-06-18 DIAGNOSIS — I4819 Other persistent atrial fibrillation: Secondary | ICD-10-CM | POA: Diagnosis not present

## 2020-06-18 DIAGNOSIS — I129 Hypertensive chronic kidney disease with stage 1 through stage 4 chronic kidney disease, or unspecified chronic kidney disease: Secondary | ICD-10-CM | POA: Diagnosis not present

## 2020-06-18 DIAGNOSIS — F0280 Dementia in other diseases classified elsewhere without behavioral disturbance: Secondary | ICD-10-CM | POA: Diagnosis not present

## 2020-06-22 ENCOUNTER — Ambulatory Visit: Payer: Medicare Other | Admitting: Oncology

## 2020-06-22 ENCOUNTER — Ambulatory Visit: Payer: Medicare Other

## 2020-06-22 ENCOUNTER — Other Ambulatory Visit: Payer: Medicare Other

## 2020-06-22 DIAGNOSIS — G309 Alzheimer's disease, unspecified: Secondary | ICD-10-CM | POA: Diagnosis not present

## 2020-06-22 DIAGNOSIS — F0280 Dementia in other diseases classified elsewhere without behavioral disturbance: Secondary | ICD-10-CM | POA: Diagnosis not present

## 2020-06-22 DIAGNOSIS — I129 Hypertensive chronic kidney disease with stage 1 through stage 4 chronic kidney disease, or unspecified chronic kidney disease: Secondary | ICD-10-CM | POA: Diagnosis not present

## 2020-06-22 DIAGNOSIS — I4819 Other persistent atrial fibrillation: Secondary | ICD-10-CM | POA: Diagnosis not present

## 2020-06-22 DIAGNOSIS — N189 Chronic kidney disease, unspecified: Secondary | ICD-10-CM | POA: Diagnosis not present

## 2020-06-22 DIAGNOSIS — F0150 Vascular dementia without behavioral disturbance: Secondary | ICD-10-CM | POA: Diagnosis not present

## 2020-06-25 DIAGNOSIS — N189 Chronic kidney disease, unspecified: Secondary | ICD-10-CM | POA: Diagnosis not present

## 2020-06-25 DIAGNOSIS — F0150 Vascular dementia without behavioral disturbance: Secondary | ICD-10-CM | POA: Diagnosis not present

## 2020-06-25 DIAGNOSIS — G309 Alzheimer's disease, unspecified: Secondary | ICD-10-CM | POA: Diagnosis not present

## 2020-06-25 DIAGNOSIS — I4819 Other persistent atrial fibrillation: Secondary | ICD-10-CM | POA: Diagnosis not present

## 2020-06-25 DIAGNOSIS — F0280 Dementia in other diseases classified elsewhere without behavioral disturbance: Secondary | ICD-10-CM | POA: Diagnosis not present

## 2020-06-25 DIAGNOSIS — I129 Hypertensive chronic kidney disease with stage 1 through stage 4 chronic kidney disease, or unspecified chronic kidney disease: Secondary | ICD-10-CM | POA: Diagnosis not present

## 2020-06-29 DIAGNOSIS — F0280 Dementia in other diseases classified elsewhere without behavioral disturbance: Secondary | ICD-10-CM | POA: Diagnosis not present

## 2020-06-29 DIAGNOSIS — G309 Alzheimer's disease, unspecified: Secondary | ICD-10-CM | POA: Diagnosis not present

## 2020-06-29 DIAGNOSIS — F0150 Vascular dementia without behavioral disturbance: Secondary | ICD-10-CM | POA: Diagnosis not present

## 2020-06-30 DIAGNOSIS — F0150 Vascular dementia without behavioral disturbance: Secondary | ICD-10-CM | POA: Diagnosis not present

## 2020-06-30 DIAGNOSIS — G309 Alzheimer's disease, unspecified: Secondary | ICD-10-CM | POA: Diagnosis not present

## 2020-06-30 DIAGNOSIS — I129 Hypertensive chronic kidney disease with stage 1 through stage 4 chronic kidney disease, or unspecified chronic kidney disease: Secondary | ICD-10-CM | POA: Diagnosis not present

## 2020-06-30 DIAGNOSIS — N189 Chronic kidney disease, unspecified: Secondary | ICD-10-CM | POA: Diagnosis not present

## 2020-06-30 DIAGNOSIS — I4819 Other persistent atrial fibrillation: Secondary | ICD-10-CM | POA: Diagnosis not present

## 2020-06-30 DIAGNOSIS — F0280 Dementia in other diseases classified elsewhere without behavioral disturbance: Secondary | ICD-10-CM | POA: Diagnosis not present

## 2020-07-01 DIAGNOSIS — I4819 Other persistent atrial fibrillation: Secondary | ICD-10-CM | POA: Diagnosis not present

## 2020-07-01 DIAGNOSIS — F0150 Vascular dementia without behavioral disturbance: Secondary | ICD-10-CM | POA: Diagnosis not present

## 2020-07-01 DIAGNOSIS — I129 Hypertensive chronic kidney disease with stage 1 through stage 4 chronic kidney disease, or unspecified chronic kidney disease: Secondary | ICD-10-CM | POA: Diagnosis not present

## 2020-07-01 DIAGNOSIS — G309 Alzheimer's disease, unspecified: Secondary | ICD-10-CM | POA: Diagnosis not present

## 2020-07-01 DIAGNOSIS — F0280 Dementia in other diseases classified elsewhere without behavioral disturbance: Secondary | ICD-10-CM | POA: Diagnosis not present

## 2020-07-01 DIAGNOSIS — N189 Chronic kidney disease, unspecified: Secondary | ICD-10-CM | POA: Diagnosis not present

## 2020-07-02 ENCOUNTER — Ambulatory Visit (INDEPENDENT_AMBULATORY_CARE_PROVIDER_SITE_OTHER): Payer: Medicare Other | Admitting: Podiatry

## 2020-07-02 ENCOUNTER — Encounter: Payer: Self-pay | Admitting: Podiatry

## 2020-07-02 ENCOUNTER — Other Ambulatory Visit: Payer: Self-pay

## 2020-07-02 DIAGNOSIS — G309 Alzheimer's disease, unspecified: Secondary | ICD-10-CM | POA: Diagnosis not present

## 2020-07-02 DIAGNOSIS — B351 Tinea unguium: Secondary | ICD-10-CM

## 2020-07-02 DIAGNOSIS — M79675 Pain in left toe(s): Secondary | ICD-10-CM

## 2020-07-02 DIAGNOSIS — F0150 Vascular dementia without behavioral disturbance: Secondary | ICD-10-CM | POA: Diagnosis not present

## 2020-07-02 DIAGNOSIS — M79674 Pain in right toe(s): Secondary | ICD-10-CM | POA: Diagnosis not present

## 2020-07-02 DIAGNOSIS — F0280 Dementia in other diseases classified elsewhere without behavioral disturbance: Secondary | ICD-10-CM | POA: Diagnosis not present

## 2020-07-02 DIAGNOSIS — I129 Hypertensive chronic kidney disease with stage 1 through stage 4 chronic kidney disease, or unspecified chronic kidney disease: Secondary | ICD-10-CM | POA: Diagnosis not present

## 2020-07-02 DIAGNOSIS — I4819 Other persistent atrial fibrillation: Secondary | ICD-10-CM | POA: Diagnosis not present

## 2020-07-02 DIAGNOSIS — N189 Chronic kidney disease, unspecified: Secondary | ICD-10-CM | POA: Diagnosis not present

## 2020-07-02 DIAGNOSIS — D689 Coagulation defect, unspecified: Secondary | ICD-10-CM

## 2020-07-02 NOTE — Progress Notes (Signed)
This patient returns to my office for at risk foot care.  This patient requires this care by a professional since this patient will be at risk due to having coagulation defect due to Eliquis.  This patient is unable to cut nails himself since the patient cannot reach his nails.These nails are painful walking and wearing shoes.   This patient presents to the office with a caregiver. This patient presents for at risk foot care today.  Patient has coagulation defect due to eliquis.  General Appearance  Alert, conversant and in no acute stress.  Vascular  Dorsalis pedis pulses are palpable  Bilaterally.Posterior tibial pulses are weakly palpable  B/L.  Capillary return is within normal limits  bilaterally. Cold feet.  Absent digital hair. bilaterally.  Neurologic  Senn-Weinstein monofilament wire test within normal limits  bilaterally. Muscle power within normal limits bilaterally.  Nails Thick disfigured discolored nails with subungual debris  from hallux to fifth toes bilaterally. No evidence of bacterial infection or drainage bilaterally.  Orthopedic  No limitations of motion  feet .  No crepitus or effusions noted.  No bony pathology or digital deformities noted.  HAV  B/L. Hammer toes 2-5  B/L.  Skin  normotropic skin with no porokeratosis noted bilaterally.  No signs of infections or ulcers noted.     Onychomycosis  Pain in right toes  Pain in left toes  Consent was obtained for treatment procedures.   Mechanical debridement of nails 1-5  bilaterally performed with a nail nipper.  Filed with dremel without incident.    Return office visit   4 months                 Told patient to return for periodic foot care and evaluation due to potential at risk complications.   Gardiner Barefoot DPM

## 2020-07-06 DIAGNOSIS — I129 Hypertensive chronic kidney disease with stage 1 through stage 4 chronic kidney disease, or unspecified chronic kidney disease: Secondary | ICD-10-CM | POA: Diagnosis not present

## 2020-07-06 DIAGNOSIS — G309 Alzheimer's disease, unspecified: Secondary | ICD-10-CM | POA: Diagnosis not present

## 2020-07-06 DIAGNOSIS — F0150 Vascular dementia without behavioral disturbance: Secondary | ICD-10-CM | POA: Diagnosis not present

## 2020-07-06 DIAGNOSIS — I4819 Other persistent atrial fibrillation: Secondary | ICD-10-CM | POA: Diagnosis not present

## 2020-07-06 DIAGNOSIS — N189 Chronic kidney disease, unspecified: Secondary | ICD-10-CM | POA: Diagnosis not present

## 2020-07-06 DIAGNOSIS — F0280 Dementia in other diseases classified elsewhere without behavioral disturbance: Secondary | ICD-10-CM | POA: Diagnosis not present

## 2020-07-08 DIAGNOSIS — N189 Chronic kidney disease, unspecified: Secondary | ICD-10-CM | POA: Diagnosis not present

## 2020-07-08 DIAGNOSIS — G309 Alzheimer's disease, unspecified: Secondary | ICD-10-CM | POA: Diagnosis not present

## 2020-07-08 DIAGNOSIS — I4819 Other persistent atrial fibrillation: Secondary | ICD-10-CM | POA: Diagnosis not present

## 2020-07-08 DIAGNOSIS — F0150 Vascular dementia without behavioral disturbance: Secondary | ICD-10-CM | POA: Diagnosis not present

## 2020-07-08 DIAGNOSIS — I129 Hypertensive chronic kidney disease with stage 1 through stage 4 chronic kidney disease, or unspecified chronic kidney disease: Secondary | ICD-10-CM | POA: Diagnosis not present

## 2020-07-08 DIAGNOSIS — F0280 Dementia in other diseases classified elsewhere without behavioral disturbance: Secondary | ICD-10-CM | POA: Diagnosis not present

## 2020-07-11 DIAGNOSIS — R440 Auditory hallucinations: Secondary | ICD-10-CM | POA: Diagnosis not present

## 2020-07-11 DIAGNOSIS — M19041 Primary osteoarthritis, right hand: Secondary | ICD-10-CM | POA: Diagnosis not present

## 2020-07-11 DIAGNOSIS — K449 Diaphragmatic hernia without obstruction or gangrene: Secondary | ICD-10-CM | POA: Diagnosis not present

## 2020-07-11 DIAGNOSIS — Z923 Personal history of irradiation: Secondary | ICD-10-CM | POA: Diagnosis not present

## 2020-07-11 DIAGNOSIS — F32A Depression, unspecified: Secondary | ICD-10-CM | POA: Diagnosis not present

## 2020-07-11 DIAGNOSIS — F0280 Dementia in other diseases classified elsewhere without behavioral disturbance: Secondary | ICD-10-CM | POA: Diagnosis not present

## 2020-07-11 DIAGNOSIS — Z8719 Personal history of other diseases of the digestive system: Secondary | ICD-10-CM | POA: Diagnosis not present

## 2020-07-11 DIAGNOSIS — N189 Chronic kidney disease, unspecified: Secondary | ICD-10-CM | POA: Diagnosis not present

## 2020-07-11 DIAGNOSIS — R251 Tremor, unspecified: Secondary | ICD-10-CM | POA: Diagnosis not present

## 2020-07-11 DIAGNOSIS — G309 Alzheimer's disease, unspecified: Secondary | ICD-10-CM | POA: Diagnosis not present

## 2020-07-11 DIAGNOSIS — G3189 Other specified degenerative diseases of nervous system: Secondary | ICD-10-CM | POA: Diagnosis not present

## 2020-07-11 DIAGNOSIS — Z8546 Personal history of malignant neoplasm of prostate: Secondary | ICD-10-CM | POA: Diagnosis not present

## 2020-07-11 DIAGNOSIS — I4819 Other persistent atrial fibrillation: Secondary | ICD-10-CM | POA: Diagnosis not present

## 2020-07-11 DIAGNOSIS — I25810 Atherosclerosis of coronary artery bypass graft(s) without angina pectoris: Secondary | ICD-10-CM | POA: Diagnosis not present

## 2020-07-11 DIAGNOSIS — I420 Dilated cardiomyopathy: Secondary | ICD-10-CM | POA: Diagnosis not present

## 2020-07-11 DIAGNOSIS — I129 Hypertensive chronic kidney disease with stage 1 through stage 4 chronic kidney disease, or unspecified chronic kidney disease: Secondary | ICD-10-CM | POA: Diagnosis not present

## 2020-07-11 DIAGNOSIS — E538 Deficiency of other specified B group vitamins: Secondary | ICD-10-CM | POA: Diagnosis not present

## 2020-07-11 DIAGNOSIS — R441 Visual hallucinations: Secondary | ICD-10-CM | POA: Diagnosis not present

## 2020-07-11 DIAGNOSIS — E782 Mixed hyperlipidemia: Secondary | ICD-10-CM | POA: Diagnosis not present

## 2020-07-11 DIAGNOSIS — F0150 Vascular dementia without behavioral disturbance: Secondary | ICD-10-CM | POA: Diagnosis not present

## 2020-07-11 DIAGNOSIS — N400 Enlarged prostate without lower urinary tract symptoms: Secondary | ICD-10-CM | POA: Diagnosis not present

## 2020-07-11 DIAGNOSIS — E559 Vitamin D deficiency, unspecified: Secondary | ICD-10-CM | POA: Diagnosis not present

## 2020-07-11 DIAGNOSIS — Z951 Presence of aortocoronary bypass graft: Secondary | ICD-10-CM | POA: Diagnosis not present

## 2020-07-11 DIAGNOSIS — Z8781 Personal history of (healed) traumatic fracture: Secondary | ICD-10-CM | POA: Diagnosis not present

## 2020-07-11 DIAGNOSIS — G4709 Other insomnia: Secondary | ICD-10-CM | POA: Diagnosis not present

## 2020-07-13 DIAGNOSIS — I129 Hypertensive chronic kidney disease with stage 1 through stage 4 chronic kidney disease, or unspecified chronic kidney disease: Secondary | ICD-10-CM | POA: Diagnosis not present

## 2020-07-13 DIAGNOSIS — N189 Chronic kidney disease, unspecified: Secondary | ICD-10-CM | POA: Diagnosis not present

## 2020-07-13 DIAGNOSIS — F0150 Vascular dementia without behavioral disturbance: Secondary | ICD-10-CM | POA: Diagnosis not present

## 2020-07-13 DIAGNOSIS — G309 Alzheimer's disease, unspecified: Secondary | ICD-10-CM | POA: Diagnosis not present

## 2020-07-13 DIAGNOSIS — I4819 Other persistent atrial fibrillation: Secondary | ICD-10-CM | POA: Diagnosis not present

## 2020-07-13 DIAGNOSIS — F0280 Dementia in other diseases classified elsewhere without behavioral disturbance: Secondary | ICD-10-CM | POA: Diagnosis not present

## 2020-07-15 DIAGNOSIS — N189 Chronic kidney disease, unspecified: Secondary | ICD-10-CM | POA: Diagnosis not present

## 2020-07-15 DIAGNOSIS — F0150 Vascular dementia without behavioral disturbance: Secondary | ICD-10-CM | POA: Diagnosis not present

## 2020-07-15 DIAGNOSIS — I129 Hypertensive chronic kidney disease with stage 1 through stage 4 chronic kidney disease, or unspecified chronic kidney disease: Secondary | ICD-10-CM | POA: Diagnosis not present

## 2020-07-15 DIAGNOSIS — G309 Alzheimer's disease, unspecified: Secondary | ICD-10-CM | POA: Diagnosis not present

## 2020-07-15 DIAGNOSIS — I4819 Other persistent atrial fibrillation: Secondary | ICD-10-CM | POA: Diagnosis not present

## 2020-07-15 DIAGNOSIS — F0280 Dementia in other diseases classified elsewhere without behavioral disturbance: Secondary | ICD-10-CM | POA: Diagnosis not present

## 2020-07-20 DIAGNOSIS — F0280 Dementia in other diseases classified elsewhere without behavioral disturbance: Secondary | ICD-10-CM | POA: Diagnosis not present

## 2020-07-20 DIAGNOSIS — N189 Chronic kidney disease, unspecified: Secondary | ICD-10-CM | POA: Diagnosis not present

## 2020-07-20 DIAGNOSIS — I129 Hypertensive chronic kidney disease with stage 1 through stage 4 chronic kidney disease, or unspecified chronic kidney disease: Secondary | ICD-10-CM | POA: Diagnosis not present

## 2020-07-20 DIAGNOSIS — F0150 Vascular dementia without behavioral disturbance: Secondary | ICD-10-CM | POA: Diagnosis not present

## 2020-07-20 DIAGNOSIS — G309 Alzheimer's disease, unspecified: Secondary | ICD-10-CM | POA: Diagnosis not present

## 2020-07-20 DIAGNOSIS — I4819 Other persistent atrial fibrillation: Secondary | ICD-10-CM | POA: Diagnosis not present

## 2020-07-21 ENCOUNTER — Other Ambulatory Visit: Payer: Self-pay | Admitting: Physician Assistant

## 2020-07-21 NOTE — Telephone Encounter (Signed)
Requested medication (s) are due for refill today: no  Requested medication (s) are on the active medication list: yes  Last refill:  06/25/2020  Future visit scheduled: yes  Notes to clinic:  this refill cannot be delegated   Requested Prescriptions  Pending Prescriptions Disp Refills   amiodarone (PACERONE) 200 MG tablet [Pharmacy Med Name: amiodarone 200 mg tablet] 90 tablet 1    Sig: TAKE ONE TABLET BY MOUTH DAILY      Not Delegated - Cardiovascular: Antiarrhythmic Agents - amiodarone Failed - 07/21/2020 10:07 AM      Failed - This refill cannot be delegated      Failed - Ca in normal range and within 180 days    Calcium  Date Value Ref Range Status  03/17/2020 8.5 (L) 8.9 - 10.3 mg/dL Final          Failed - AST in normal range and within 180 days    AST  Date Value Ref Range Status  03/17/2020 237 (H) 15 - 41 U/L Final          Failed - ALT in normal range and within 180 days    ALT  Date Value Ref Range Status  03/17/2020 186 (H) 0 - 44 U/L Final          Failed - Patient had ECG in the last 180 days      Failed - Last BP in normal range    BP Readings from Last 1 Encounters:  03/17/20 (!) 186/56          Passed - TSH in normal range and within 180 days    TSH  Date Value Ref Range Status  03/17/2020 0.626 0.350 - 4.500 uIU/mL Final    Comment:    Performed by a 3rd Generation assay with a functional sensitivity of <=0.01 uIU/mL. Performed at [facility], [address] [place], [place] [address]   02/16/2016 0.334 (L) 0.450 - 4.500 uIU/mL Final          Passed - Mg Level in normal range and within 180 days    Magnesium  Date Value Ref Range Status  03/17/2020 2.1 1.7 - 2.4 mg/dL Final    Comment:    Performed at [facility], [address] [place], [place] [address]          Passed - K in normal range and within 180 days    Potassium  Date Value Ref Range Status  03/17/2020 4.2 3.5 - 5.1 mmol/L Final           Passed - Last Heart Rate in normal range    Pulse Readings from Last 1 Encounters:  03/17/20 70          Passed - Valid encounter within last 6 months    Recent Outpatient Visits           4 months ago Weakness   [facility] [place], [place], [place]   1 year ago Neck pain   [place], [place], [place]   2 years ago Left-sided chest pain   [facility] [place], [place]   2 years ago Postprocedural hypotension   [facility] [place], [place], [place]   2 years ago S/P cholecystectomy   [place], [place], [place]       Future Appointments             In 1 month Virginia Crews,  MD [facility], [place]   In [place] month Hollice Espy, [facility]

## 2020-07-22 DIAGNOSIS — F0280 Dementia in other diseases classified elsewhere without behavioral disturbance: Secondary | ICD-10-CM | POA: Diagnosis not present

## 2020-07-22 DIAGNOSIS — N189 Chronic kidney disease, unspecified: Secondary | ICD-10-CM | POA: Diagnosis not present

## 2020-07-22 DIAGNOSIS — I129 Hypertensive chronic kidney disease with stage 1 through stage 4 chronic kidney disease, or unspecified chronic kidney disease: Secondary | ICD-10-CM | POA: Diagnosis not present

## 2020-07-22 DIAGNOSIS — I4819 Other persistent atrial fibrillation: Secondary | ICD-10-CM | POA: Diagnosis not present

## 2020-07-22 DIAGNOSIS — G309 Alzheimer's disease, unspecified: Secondary | ICD-10-CM | POA: Diagnosis not present

## 2020-07-22 DIAGNOSIS — F0150 Vascular dementia without behavioral disturbance: Secondary | ICD-10-CM | POA: Diagnosis not present

## 2020-07-31 ENCOUNTER — Encounter: Payer: Self-pay | Admitting: Physician Assistant

## 2020-08-19 ENCOUNTER — Ambulatory Visit: Payer: Medicare Other | Admitting: Physician Assistant

## 2020-08-20 ENCOUNTER — Ambulatory Visit: Payer: Medicare Other | Admitting: Family Medicine
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
## 2020-08-25 ENCOUNTER — Ambulatory Visit: Payer: Medicare Other | Admitting: Urology

## 2020-08-26 ENCOUNTER — Ambulatory Visit: Payer: Medicare Other | Admitting: Urology
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
## 2020-09-02 ENCOUNTER — Other Ambulatory Visit: Payer: Self-pay

## 2020-09-02 ENCOUNTER — Ambulatory Visit (INDEPENDENT_AMBULATORY_CARE_PROVIDER_SITE_OTHER): Payer: Medicare Other | Admitting: Urology

## 2020-09-02 ENCOUNTER — Other Ambulatory Visit: Payer: Self-pay | Admitting: Physician Assistant

## 2020-09-02 VITALS — BP 118/62 | HR 72 | Ht 72.0 in | Wt 138.0 lb

## 2020-09-02 DIAGNOSIS — M542 Cervicalgia: Secondary | ICD-10-CM

## 2020-09-02 DIAGNOSIS — C61 Malignant neoplasm of prostate: Secondary | ICD-10-CM | POA: Diagnosis not present

## 2020-09-03 ENCOUNTER — Telehealth: Payer: Self-pay | Admitting: *Deleted

## 2020-09-03 LAB — PSA: Prostate Specific Ag, Serum: <0.1 ng/mL (ref 0.0–4.0)

## 2020-09-03 NOTE — Telephone Encounter (Signed)
Requested medication (s) are due for refill today: yes  Requested medication (s) are on the active medication list: yes  Last refill:  06/11/2020  Future visit scheduled: yes   Notes to clinic:  this refill cannot be delegated    Requested Prescriptions  Pending Prescriptions Disp Refills   traMADol (ULTRAM) 50 MG tablet [Pharmacy Med Name: traMADol HCl 50 MG Oral Tablet] 30 tablet 0    Sig: TAKE 1 TABLET BY MOUTH EVERY 6 HOURS AS NEEDED      Not Delegated - Analgesics:  Opioid Agonists Failed - 09/02/2020  6:52 PM      Failed - This refill cannot be delegated      Failed - Urine Drug Screen completed in last 360 days      Passed - Valid encounter within last 6 months    Recent Outpatient Visits           5 months ago Weakness   [facility] [place], Clearnce Sorrel, [place]   1 year ago Neck pain   [place], [place], [place]   2 years ago Left-sided chest pain   [facility] Car[place] Collet M, [place]   2 years ago Postprocedural hypotension   [place], Clearnce Sorrel, [place]   2 years ago S/P cholecystectomy   [place], Clearnce Sorrel, [place]       Future Appointments             In 1 month Bacigalupo, Dionne Bucy, MD [facility], [place]   In 6 months Hollice Espy, [place]

## 2020-09-03 NOTE — Telephone Encounter (Addendum)
Left VM asked to return call with any concerns or questions.  ----- Message from Hollice Espy, MD sent at 09/03/2020  8:19 AM EDT ----- PSA remains undetectable, great news.  Hollice Espy, MD

## 2020-09-07 ENCOUNTER — Telehealth: Payer: Self-pay

## 2020-09-07 ENCOUNTER — Emergency Department
Admission: EM | Admit: 2020-09-07 | Discharge: 2020-09-30 | Disposition: E | Payer: Medicare Other | Attending: Emergency Medicine | Admitting: Emergency Medicine

## 2020-09-07 ENCOUNTER — Encounter: Admission: EM | Disposition: E | Payer: Self-pay | Source: Home / Self Care | Attending: Emergency Medicine

## 2020-09-07 DIAGNOSIS — Z8546 Personal history of malignant neoplasm of prostate: Secondary | ICD-10-CM | POA: Diagnosis not present

## 2020-09-07 DIAGNOSIS — Z7901 Long term (current) use of anticoagulants: Secondary | ICD-10-CM | POA: Diagnosis not present

## 2020-09-07 DIAGNOSIS — Z7982 Long term (current) use of aspirin: Secondary | ICD-10-CM | POA: Diagnosis not present

## 2020-09-07 DIAGNOSIS — Z951 Presence of aortocoronary bypass graft: Secondary | ICD-10-CM | POA: Insufficient documentation

## 2020-09-07 DIAGNOSIS — Z8551 Personal history of malignant neoplasm of bladder: Secondary | ICD-10-CM | POA: Insufficient documentation

## 2020-09-07 DIAGNOSIS — I469 Cardiac arrest, cause unspecified: Secondary | ICD-10-CM | POA: Insufficient documentation

## 2020-09-07 DIAGNOSIS — I10 Essential (primary) hypertension: Secondary | ICD-10-CM | POA: Insufficient documentation

## 2020-09-07 DIAGNOSIS — I2510 Atherosclerotic heart disease of native coronary artery without angina pectoris: Secondary | ICD-10-CM | POA: Diagnosis not present

## 2020-09-07 DIAGNOSIS — Z79899 Other long term (current) drug therapy: Secondary | ICD-10-CM | POA: Insufficient documentation

## 2020-09-07 DIAGNOSIS — R404 Transient alteration of awareness: Secondary | ICD-10-CM | POA: Diagnosis not present

## 2020-09-07 DIAGNOSIS — I213 ST elevation (STEMI) myocardial infarction of unspecified site: Secondary | ICD-10-CM | POA: Insufficient documentation

## 2020-09-07 DIAGNOSIS — R0689 Other abnormalities of breathing: Secondary | ICD-10-CM | POA: Diagnosis not present

## 2020-09-07 DIAGNOSIS — R079 Chest pain, unspecified: Secondary | ICD-10-CM | POA: Diagnosis present

## 2020-09-07 DIAGNOSIS — I499 Cardiac arrhythmia, unspecified: Secondary | ICD-10-CM | POA: Diagnosis not present

## 2020-09-07 DIAGNOSIS — R4020 Unspecified coma: Secondary | ICD-10-CM | POA: Diagnosis not present

## 2020-09-07 SURGERY — CORONARY/GRAFT ACUTE MI REVASCULARIZATION
Anesthesia: Moderate Sedation

## 2020-09-07 MED ORDER — LIDOCAINE HCL (PF) 1 % IJ SOLN
INTRAMUSCULAR | Status: AC
  Filled 2020-09-07: qty 30

## 2020-09-07 MED ORDER — HEPARIN (PORCINE) IN NACL 1000-0.9 UT/500ML-% IV SOLN
INTRAVENOUS | Status: AC
  Filled 2020-09-07: qty 1000

## 2020-09-07 MED ORDER — ATROPINE SULFATE 1 MG/ML IJ SOLN
INTRAMUSCULAR | Status: AC | PRN
  Administered 2020-09-07: .5 mg via INTRAVENOUS

## 2020-09-07 MED ORDER — EPINEPHRINE 1 MG/10ML IJ SOSY
PREFILLED_SYRINGE | INTRAMUSCULAR | Status: AC | PRN
  Administered 2020-09-07: 1 via INTRAVENOUS

## 2020-09-07 MED ORDER — VERAPAMIL HCL 2.5 MG/ML IV SOLN
INTRAVENOUS | Status: AC
  Filled 2020-09-07: qty 2

## 2020-09-08 NOTE — Telephone Encounter (Signed)
They will send through [place].

## 2020-09-08 NOTE — Telephone Encounter (Signed)
I can do it. Can they send it to me in [place] or drop it by.

## 2020-09-11 NOTE — Telephone Encounter (Signed)
Karena Addison, from [employer] home, calling in regards to death certificate. She states that she has not received a signed copy through [place] and is requesting to have an update. Spoke with Mickel Baas in office and was advised to send a message due to Dr. B seeing pts. Please advise.     Case # [id number] Phone # [phone]

## 2020-09-11 NOTE — Telephone Encounter (Signed)
Death certificate completed. Sorry for the delay as I had to call [place] [facility] to unlock my account.

## 2020-09-11 NOTE — Telephone Encounter (Signed)
Per [employer] is not completed and at the end you should get a notification saying "Thank you, certified" Karena Addison will email to Judson Roch a step by step guide.

## 2020-09-11 NOTE — Telephone Encounter (Signed)
Called and spoke with [facility] who checked status on phone and she still states it shows uncertified. KW

## 2020-09-11 NOTE — Telephone Encounter (Signed)
Maybe they can look again. It looks done on my end and I relinquished it.

## 2020-09-14 NOTE — Telephone Encounter (Signed)
[facility] confirmed certificate complete.

## 2020-09-14 NOTE — Telephone Encounter (Signed)
It is certified now. I didn't change anything, but apparently it likes it more now.
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
## 2020-09-30 NOTE — Code Documentation (Addendum)
Pulse check: femoral pulse found.  0330: atropine administered 0331: ROSC achieved.
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
## 2020-09-30 NOTE — Telephone Encounter (Signed)
Copied from [place] [id number]. Topic: General - Other >> 10-07-20  1:56 PM Tessa Lerner A wrote: Reason for CRM: Karena Addison with [employer] would like to be contacted regarding the completion of patient's death certificate   Karena Addison was uncertain of which Physician would be completing the remaining paperwork  Please contact to advise further when possible

## 2020-09-30 NOTE — ED Notes (Signed)
Pt transport arrived to move patient to the morgue. Pt transferred OTF

## 2020-09-30 NOTE — Progress Notes (Signed)
Initial Stemi pg, needed support to family as doctor consulted about further care.Family remained bedside until patient passed.

## 2020-09-30 NOTE — ED Notes (Signed)
[place] contacted at [employer]. Spoke to representative Bethena Roys and was given case # [id number]. Pt not a candidate for donation and cleared for release to funeral home.

## 2020-09-30 NOTE — ED Notes (Signed)
2863: epi given

## 2020-09-30 NOTE — ED Notes (Signed)
Pt family at bedside with Dr Karma Greaser. Family agreed that no further resuscitative efforts are needed.

## 2020-09-30 NOTE — Consult Note (Signed)
    Brief Interventional Cardiology Note:)  Code STEMI called for 87 year old gentleman with known coronary disease-CABG x4 with LIMA-LAD, SVG-OM1, SVG-OM2 and SVG-PDA with conflicting information about his EF (echocardiogram suggest EF of 50%, but cardiologist (Dr. Ubaldo Glassing) indicates EF of 30 to 35%.  He has known baseline atrial fibrillation and right bundle branch block.  Apparently has been having relatively rough week with several falls and generalized weakness and failure to thrive.  Tonight he was not feeling well, he is a 24-hour caregiver who  reported to EMS that he was not feeling well, went into his room to pray, and then collapsed.  She walked into the room to see him for labs.  EMS was contacted and CPR was initiated on scene 5 minutes after collapse.  After roughly 12 minutes of CPR, they did have ROSC briefly only to lose pulses.  Initial rhythm was PEA.  After losing pulses the second time, ROSC was again obtained after 68minutes, however the patient was being transcutaneously placed upon arrival to the ER.  Upon arrival to the ER he did have a blood pressure, but once stabilized on the ER stretcher, he was pulseless.  After 3 minutes of CPR with 2 rounds of epinephrine and atropine, pulse was temporarily restored with response to epinephrine, however after several minutes he continued to progressively decline.  I was present along with the ER physician to discuss the situation with the patient's family.  He apparently does have a DNR order, but it was not placed in a visible location.  Based on his persistent hypotension and medication requirement, the decision was made to not pursue further CPR and allow the patient to pass.Glenetta Hew, MD

## 2020-09-30 DEATH — deceased

## 2020-10-12 ENCOUNTER — Ambulatory Visit: Payer: Medicare Other | Admitting: Oncology

## 2020-10-12 ENCOUNTER — Other Ambulatory Visit: Payer: Medicare Other

## 2020-10-27 ENCOUNTER — Ambulatory Visit: Payer: Medicare Other | Admitting: Family Medicine

## 2020-11-09 ENCOUNTER — Ambulatory Visit: Payer: Medicare Other | Admitting: Podiatry

## 2021-03-09 ENCOUNTER — Ambulatory Visit: Payer: Self-pay | Admitting: Urology

## 2021-06-21 ENCOUNTER — Other Ambulatory Visit: Payer: Self-pay | Admitting: Physician Assistant

## 2021-06-21 DIAGNOSIS — F5101 Primary insomnia: Secondary | ICD-10-CM
